# Patient Record
Sex: Female | Born: 1935 | Race: White | Hispanic: No | Smoking: Former smoker
Health system: Southern US, Community
[De-identification: ages and names within clinical notes are randomized; demographics above are authoritative.]

## PROBLEM LIST (undated history)

## (undated) DIAGNOSIS — J449 Chronic obstructive pulmonary disease, unspecified: Secondary | ICD-10-CM

## (undated) DIAGNOSIS — I10 Essential (primary) hypertension: Secondary | ICD-10-CM

## (undated) DIAGNOSIS — E785 Hyperlipidemia, unspecified: Secondary | ICD-10-CM

## (undated) HISTORY — PX: X-STOP IMPLANTATION: SHX2677

## (undated) HISTORY — PX: ROTATOR CUFF REPAIR: SHX139

## (undated) HISTORY — PX: TONSILLECTOMY: SUR1361

## (undated) HISTORY — PX: PARTIAL HYSTERECTOMY: SHX80

## (undated) HISTORY — PX: ABDOMINAL HYSTERECTOMY: SHX81

## (undated) HISTORY — PX: OTHER SURGICAL HISTORY: SHX169

## (undated) HISTORY — PX: APPENDECTOMY: SHX54

## (undated) HISTORY — DX: Essential (primary) hypertension: I10

## (undated) HISTORY — PX: MULTIPLE TOOTH EXTRACTIONS: SHX2053

## (undated) HISTORY — DX: Hyperlipidemia, unspecified: E78.5

---

## 2007-12-29 LAB — HM COLONOSCOPY: HM Colonoscopy: NORMAL

## 2013-07-18 DIAGNOSIS — E559 Vitamin D deficiency, unspecified: Secondary | ICD-10-CM | POA: Diagnosis not present

## 2013-07-18 DIAGNOSIS — E785 Hyperlipidemia, unspecified: Secondary | ICD-10-CM | POA: Diagnosis not present

## 2013-07-18 DIAGNOSIS — Z79899 Other long term (current) drug therapy: Secondary | ICD-10-CM | POA: Diagnosis not present

## 2013-09-22 DIAGNOSIS — H524 Presbyopia: Secondary | ICD-10-CM | POA: Diagnosis not present

## 2013-09-22 DIAGNOSIS — H18519 Endothelial corneal dystrophy, unspecified eye: Secondary | ICD-10-CM | POA: Diagnosis not present

## 2013-10-24 DIAGNOSIS — I10 Essential (primary) hypertension: Secondary | ICD-10-CM | POA: Diagnosis not present

## 2013-10-24 DIAGNOSIS — Z0389 Encounter for observation for other suspected diseases and conditions ruled out: Secondary | ICD-10-CM | POA: Diagnosis not present

## 2013-10-24 DIAGNOSIS — E559 Vitamin D deficiency, unspecified: Secondary | ICD-10-CM | POA: Diagnosis not present

## 2013-10-24 DIAGNOSIS — Z0000 Encounter for general adult medical examination without abnormal findings: Secondary | ICD-10-CM | POA: Diagnosis not present

## 2013-10-24 DIAGNOSIS — J410 Simple chronic bronchitis: Secondary | ICD-10-CM | POA: Diagnosis not present

## 2013-10-24 DIAGNOSIS — E785 Hyperlipidemia, unspecified: Secondary | ICD-10-CM | POA: Diagnosis not present

## 2013-12-22 DIAGNOSIS — H18519 Endothelial corneal dystrophy, unspecified eye: Secondary | ICD-10-CM | POA: Diagnosis not present

## 2013-12-22 DIAGNOSIS — H40019 Open angle with borderline findings, low risk, unspecified eye: Secondary | ICD-10-CM | POA: Diagnosis not present

## 2014-04-18 DIAGNOSIS — E784 Other hyperlipidemia: Secondary | ICD-10-CM | POA: Diagnosis not present

## 2014-04-24 DIAGNOSIS — Z6825 Body mass index (BMI) 25.0-25.9, adult: Secondary | ICD-10-CM | POA: Diagnosis not present

## 2014-04-24 DIAGNOSIS — E785 Hyperlipidemia, unspecified: Secondary | ICD-10-CM | POA: Diagnosis not present

## 2014-04-24 DIAGNOSIS — I10 Essential (primary) hypertension: Secondary | ICD-10-CM | POA: Diagnosis not present

## 2014-04-24 DIAGNOSIS — K219 Gastro-esophageal reflux disease without esophagitis: Secondary | ICD-10-CM | POA: Diagnosis not present

## 2014-04-27 DIAGNOSIS — R509 Fever, unspecified: Secondary | ICD-10-CM | POA: Diagnosis not present

## 2014-04-27 DIAGNOSIS — E785 Hyperlipidemia, unspecified: Secondary | ICD-10-CM | POA: Diagnosis not present

## 2014-04-27 DIAGNOSIS — R109 Unspecified abdominal pain: Secondary | ICD-10-CM | POA: Diagnosis not present

## 2014-04-27 DIAGNOSIS — Z79899 Other long term (current) drug therapy: Secondary | ICD-10-CM | POA: Diagnosis not present

## 2014-04-27 DIAGNOSIS — E780 Pure hypercholesterolemia: Secondary | ICD-10-CM | POA: Diagnosis not present

## 2014-04-27 DIAGNOSIS — F17210 Nicotine dependence, cigarettes, uncomplicated: Secondary | ICD-10-CM | POA: Diagnosis not present

## 2014-04-27 DIAGNOSIS — K2970 Gastritis, unspecified, without bleeding: Secondary | ICD-10-CM | POA: Diagnosis not present

## 2014-04-27 DIAGNOSIS — K353 Acute appendicitis with localized peritonitis: Secondary | ICD-10-CM | POA: Diagnosis not present

## 2014-04-27 DIAGNOSIS — K3589 Other acute appendicitis: Secondary | ICD-10-CM | POA: Diagnosis not present

## 2014-04-27 DIAGNOSIS — K3580 Unspecified acute appendicitis: Secondary | ICD-10-CM | POA: Diagnosis not present

## 2014-04-27 DIAGNOSIS — R10813 Right lower quadrant abdominal tenderness: Secondary | ICD-10-CM | POA: Diagnosis not present

## 2014-04-27 DIAGNOSIS — Z7982 Long term (current) use of aspirin: Secondary | ICD-10-CM | POA: Diagnosis not present

## 2014-04-27 DIAGNOSIS — I10 Essential (primary) hypertension: Secondary | ICD-10-CM | POA: Diagnosis not present

## 2014-07-19 DIAGNOSIS — G4709 Other insomnia: Secondary | ICD-10-CM | POA: Diagnosis not present

## 2014-09-27 DIAGNOSIS — H1851 Endothelial corneal dystrophy: Secondary | ICD-10-CM | POA: Diagnosis not present

## 2014-09-27 DIAGNOSIS — Z947 Corneal transplant status: Secondary | ICD-10-CM | POA: Diagnosis not present

## 2014-11-21 ENCOUNTER — Ambulatory Visit: Payer: Self-pay | Admitting: Family Medicine

## 2014-11-22 ENCOUNTER — Encounter: Payer: Self-pay | Admitting: Family Medicine

## 2014-11-22 ENCOUNTER — Ambulatory Visit (INDEPENDENT_AMBULATORY_CARE_PROVIDER_SITE_OTHER): Payer: Medicare Other | Admitting: Family Medicine

## 2014-11-22 VITALS — BP 110/50 | HR 76 | Ht 62.0 in | Wt 128.0 lb

## 2014-11-22 DIAGNOSIS — M1990 Unspecified osteoarthritis, unspecified site: Secondary | ICD-10-CM | POA: Insufficient documentation

## 2014-11-22 DIAGNOSIS — M150 Primary generalized (osteo)arthritis: Secondary | ICD-10-CM

## 2014-11-22 DIAGNOSIS — Z87891 Personal history of nicotine dependence: Secondary | ICD-10-CM | POA: Insufficient documentation

## 2014-11-22 DIAGNOSIS — F17200 Nicotine dependence, unspecified, uncomplicated: Secondary | ICD-10-CM | POA: Insufficient documentation

## 2014-11-22 DIAGNOSIS — Z720 Tobacco use: Secondary | ICD-10-CM

## 2014-11-22 DIAGNOSIS — I129 Hypertensive chronic kidney disease with stage 1 through stage 4 chronic kidney disease, or unspecified chronic kidney disease: Secondary | ICD-10-CM | POA: Insufficient documentation

## 2014-11-22 DIAGNOSIS — E785 Hyperlipidemia, unspecified: Secondary | ICD-10-CM

## 2014-11-22 DIAGNOSIS — I10 Essential (primary) hypertension: Secondary | ICD-10-CM

## 2014-11-22 DIAGNOSIS — K219 Gastro-esophageal reflux disease without esophagitis: Secondary | ICD-10-CM

## 2014-11-22 DIAGNOSIS — M159 Polyosteoarthritis, unspecified: Secondary | ICD-10-CM

## 2014-11-22 DIAGNOSIS — N951 Menopausal and female climacteric states: Secondary | ICD-10-CM | POA: Insufficient documentation

## 2014-11-22 DIAGNOSIS — Z8739 Personal history of other diseases of the musculoskeletal system and connective tissue: Secondary | ICD-10-CM

## 2014-11-22 NOTE — Progress Notes (Signed)
Date:  11/22/2014   Name:  Karen Velasquez   DOB:  [birth year]   MRN:  [id number]  PCP:  Adline Potter, MD    Chief Complaint: Establish Care   History of Present Illness:  This is a 81 y.o. female with MMP per problem list. She complains of fatigue and exercise intolerance. When we reviewed her medication list, she indicated that she had no interest in discontinuing any of her chronic medications. She stated her previous doctor put her on the medications for a reason and she was doing fine and uninterested in making any changes. Prior to exam we therefore agreed that I could not effectively serve as her PCP despite my status as a board-certified geriatrician.  Review of Systems:  Review of Systems  Patient Active Problem List   Diagnosis Date Noted  . Hypertension 11/22/2014  . GERD (gastroesophageal reflux disease) 11/22/2014  . Hot flashes, menopausal 11/22/2014  . Hyperlipidemia 11/22/2014  . Osteoarthritis 11/22/2014  . Smoker 11/22/2014  . Hx of spinal stenosis 11/22/2014    Prior to Admission medications   Medication Sig Start Date End Date Taking? Authorizing Provider  aspirin EC 81 MG tablet Take by mouth.   Yes Historical Provider, MD  Calcium Carbonate-Vitamin D 600-400 MG-UNIT per tablet Take by mouth.   Yes Historical Provider, MD  esomeprazole (NEXIUM) 40 MG capsule Take by mouth.   Yes Historical Provider, MD  estradiol (ESTRACE) 0.5 MG tablet Take by mouth.   Yes Historical Provider, MD  ezetimibe (ZETIA) 10 MG tablet Take by mouth.   Yes Historical Provider, MD  fenofibrate micronized (ANTARA) 130 MG capsule Take by mouth.   Yes Historical Provider, MD  lisinopril-hydrochlorothiazide (PRINZIDE,ZESTORETIC) 10-12.5 MG per tablet Take by mouth.   Yes Historical Provider, MD  meloxicam (MOBIC) 15 MG tablet Take by mouth.   Yes Historical Provider, MD  naftifine (NAFTIN) 1 % cream Apply topically.   Yes Historical Provider, MD  vitamin C (ASCORBIC ACID) 500 MG tablet Take  by mouth.   Yes Historical Provider, MD    No Known Allergies  Past Surgical History  Procedure Laterality Date  . Tonsillectomy      as a child  . Partial hysterectomy      31yrs of age  . Rotator cuff repair      57yr ago  . Cornea implant      3,4 yrs ago  . Appendectomy      2105  . X-stop implantation N/A     74yrs ago    History  Substance Use Topics  . Smoking status: Current Every Day Smoker -- 0.50 packs/day    Types: Cigarettes  . Smokeless tobacco: Never Used  . Alcohol Use: No    Family History  Problem Relation Age of Onset  . Diabetes Father   . Cancer Father   . Cancer Sister     Medication list has been reviewed and updated.  Physical Examination: BP 110/50 mmHg  Pulse 76  Ht 5\' 2"  (1.575 m)  Wt 128 lb (58.06 kg)  BMI 23.41 kg/m2  Physical Exam  Patient was not examined.  Assessment and Plan:  1. Essential hypertension Concerned hypotension contributing to fatigue/exercise intolerance but pt uninterested in medication changes.  2. Gastroesophageal reflux disease, esophagitis presence not specified Discussed LT risk of continued Nexium use  3. Hot flashes, menopausal Discussed LT risk of continued Estrace use  4. Hyperlipidemia Discussed lack of benefit of primary prevention in this setting  5. Primary  osteoarthritis involving multiple joints Discussed LT risk of continued NSAID use  6. Smoker Not interested in quitting  7. Hx of spinal stenosis  Per HPI, we agreed patient would be best served by finding another PCP.  No Follow-up on file.  Satira Anis. [facility]  11/22/2014

## 2014-12-08 ENCOUNTER — Ambulatory Visit (INDEPENDENT_AMBULATORY_CARE_PROVIDER_SITE_OTHER): Payer: Medicare Other | Admitting: Unknown Physician Specialty

## 2014-12-08 ENCOUNTER — Encounter: Payer: Self-pay | Admitting: Unknown Physician Specialty

## 2014-12-08 VITALS — BP 132/74 | HR 82 | Temp 98.2°F | Ht 61.8 in | Wt 128.4 lb

## 2014-12-08 DIAGNOSIS — I10 Essential (primary) hypertension: Secondary | ICD-10-CM | POA: Diagnosis not present

## 2014-12-08 DIAGNOSIS — M150 Primary generalized (osteo)arthritis: Secondary | ICD-10-CM | POA: Diagnosis not present

## 2014-12-08 DIAGNOSIS — F17200 Nicotine dependence, unspecified, uncomplicated: Secondary | ICD-10-CM

## 2014-12-08 DIAGNOSIS — E785 Hyperlipidemia, unspecified: Secondary | ICD-10-CM | POA: Diagnosis not present

## 2014-12-08 DIAGNOSIS — K219 Gastro-esophageal reflux disease without esophagitis: Secondary | ICD-10-CM

## 2014-12-08 DIAGNOSIS — Z720 Tobacco use: Secondary | ICD-10-CM | POA: Diagnosis not present

## 2014-12-08 DIAGNOSIS — L259 Unspecified contact dermatitis, unspecified cause: Secondary | ICD-10-CM | POA: Insufficient documentation

## 2014-12-08 DIAGNOSIS — M159 Polyosteoarthritis, unspecified: Secondary | ICD-10-CM

## 2014-12-08 LAB — MICROALBUMIN, URINE WAIVED
CREATININE, URINE WAIVED: 50 mg/dL (ref 10–300)
Microalb, Ur Waived: 10 mg/L (ref 0–19)
Microalb/Creat Ratio: <30 mg/g (ref <30)

## 2014-12-08 LAB — LIPID PANEL PICCOLO, WAIVED
Chol/HDL Ratio Piccolo,Waive: 2.2 mg/dL
Cholesterol Piccolo, Waived: 153 mg/dL (ref <200)
HDL Chol Piccolo, Waived: 70 mg/dL (ref >59)
LDL Chol Calc Piccolo Waived: 68 mg/dL (ref <100)
TRIGLYCERIDES PICCOLO,WAIVED: 74 mg/dL (ref <150)
VLDL Chol Calc Piccolo,Waive: 15 mg/dL (ref <30)

## 2014-12-08 MED ORDER — LISINOPRIL-HYDROCHLOROTHIAZIDE 10-12.5 MG PO TABS: 1.0000 | ORAL_TABLET | Freq: Every day | ORAL | Status: DC

## 2014-12-08 MED ORDER — BETAMETHASONE DIPROPIONATE 0.05 % EX CREA: TOPICAL_CREAM | Freq: Two times a day (BID) | CUTANEOUS | Status: DC

## 2014-12-08 MED ORDER — MELOXICAM 15 MG PO TABS: 15.0000 mg | ORAL_TABLET | Freq: Every day | ORAL | Status: DC

## 2014-12-08 NOTE — Patient Instructions (Addendum)
f you have chronic pain and are looking for alternatives to medication and surgery, you have a lot of options.   However, not all alternative pain treatments work. Some can even be risky. Some alternative treatments may help with pain from bad backs, osteoarthritis, and headaches, but have no effect on chronic pain from fibromyalgia or diabetic nerve damage.  Here's a rundown of the most commonly used alternative treatments for chronic pain.  Acupuncture. Once seen as bizarre, acupuncture is rapidly becoming a mainstream treatment for pain. Studies have found that it works for pain caused by many conditions, including fibromyalgia, osteoarthritis, back injuries, and sports injuries. How does it work? No one's quite sure. It could release pain-numbing chemicals in the body. Or it might block the pain signals coming from the nerves.  Exercise. Motion is lotion Going for a walk or swim isn't a treatment, exactly. But regular physical activity has big benefits for people with many different painful conditions. Study after study has found that physical activity can help relieve chronic pain, as well as boost energy and mood.   This is particularly true of pain related to arthritis .    Chiropractic manipulation. Although mainstream medicine has traditionally regarded spinal manipulation with suspicion, it's becoming a more accepted treatment.  I think many people respond will th chiropractic treatment.    Supplements and vitamins. There is evidence that certain dietary supplements and vitamins can help with certain types of pain. Fish oil and flaxseed oil is often used to reduce pain associated with swelling. Topical capsaicin, derived from chili peppers, may help with arthritis, diabetic nerve pain, and other conditions. There's evidence that glucosamine can help relieve moderate to severe pain from osteoarthritis in the knee.  A spice Tumeric is used my many to treat chronic pain.  Curcumin is the active  ingredient and thought to have anti-inflammatory effects and reduces pain and stiffness.  This can be found as capsules or extract (more likely to be free of contaminants). For OA: Capsule, typically 400 mg to 600 mg, three times per day; or 0.5 g to 1 g of powdered root up to 3 g per day. For RA: 500 mg twice daily.  It's best absorbed if it contains black pepper.    But when it comes to supplements, you have to be careful. High doses of B6 can damage the nerves.   Some studies suggest that supplements such as ginkgo biloba and ginseng can thin the blood and increase the risk of bleeding. This could lead to serious consequences for anyone getting surgery for chronic pain.  Finally, supplements don't always contain what they claim as supplement manufacturers are not regulated by the FDA.  I get very confused with the thousands of supplements available and which to choose.  Brands to consider: [employer], [employer], [employer], [employer], [employer], Duwayne Heck and Tresa Garter   I usually go on [employer] and look for the highly rated products.  [employer] or [employer] are great resources and have done the research for you.     Cognitive Behavioral Therapy. Some people with chronic pain balk at the idea of seeing a therapist -- they think it implies that their pain isn't real. But studies show that depression and chronic pain often go together. Chronic pain can cause or worsen depression; depression can lower a person's tolerance for pain.  Stress-reduction techniques. There are number of approaches, including: Yoga. There's good evidence that yoga can help with chronic pain,  specifically fibromyalgia, neck pain, back pain, and arthritis. Relaxation therapy. This is actually a category of techniques that help people calm the body and release tension -- a process that might also reduce pain. Some approaches teach people how to focus on their breathing. Research shows that relaxation  therapy can help with fibromyalgia, headache, osteoarthritis, and other conditions. Hypnosis. Studies have found this approach helpful with different sorts of pain, like back pain, repetitive strain injuries, and cancer pain. Guided imagery. Research shows that guided imagery can help with conditions like headache pain, cancer pain, osteoarthritis, and fibromyalgia. How does it work? An expert would teach you ways to direct your thoughts by focusing on specific images. Music therapy. This approach gets people to either perform or listen to music. Studies have found that it can help with many different pain conditions, like osteoarthritis and cancer pain. Biofeedback. This approach teaches you how to control normally unconscious bodily functions, like blood pressure or your heart rate. Studies have found that it can help with headaches, fibromyalgia, and other conditions. Massage. It's undeniably relaxing. And there's some evidence that massage can help ease pain from rheumatoid arthritis, neck and back injuries, and fibromyalgia.  Risky Alternative Pain Treatments Experts say you should keep your expectations for alternative pain treatments modest -- especially when it comes to "miracle cures." Controlling chronic pain is not simple. A single supplement, device, or treatment is not going to make your chronic pain disappear. You a need to be suspicious of anyone pushing a treatment when the financial motive is blatant. That doesn't only apply to pain treatments advertised on dubious web sites asking for your credit card number.  Experts say that you should try to keep up to date with research into alternative treatments for chronic pain. The options for people with chronic pain are always growing -- and some of the odder treatments of today might become the mainstream treatments of tomorrow.

## 2014-12-08 NOTE — Assessment & Plan Note (Signed)
Not interested in quitting 

## 2014-12-08 NOTE — Assessment & Plan Note (Signed)
Encouraged not taking Meloxicam daily.  Gave handout on alternatives.

## 2014-12-08 NOTE — Assessment & Plan Note (Signed)
This is stable.  Continue present medications

## 2014-12-08 NOTE — Assessment & Plan Note (Signed)
Under excellent control.  She desires to make no changes in her treatment.

## 2014-12-08 NOTE — Assessment & Plan Note (Signed)
Suspect related to chronic rubbing following contact with something.  Rx with Diprolene cream

## 2014-12-08 NOTE — Assessment & Plan Note (Signed)
Takes nexium only on occasion

## 2014-12-08 NOTE — Progress Notes (Signed)
BP 132/74 mmHg  Pulse 82  Temp(Src) 98.2 F (36.8 C)  Ht 5' 1.8" (1.57 m)  Wt 128 lb 6.4 oz (58.242 kg)  BMI 23.63 kg/m2  SpO2 98%  LMP 11/28/1971 (Approximate)   Subjective:    Patient ID: Karen Velasquez, female    DOB: Sep 23, 1930, 81 y.o.   MRN: [id number]  HPI: Karen Velasquez is a 81 y.o. female  Chief Complaint  Patient presents with  . Establish Care    Relevant past medical, surgical, family and social history reviewed and updated as indicated. Interim medical history since our last visit reviewed. Allergies and medications reviewed and updated.  Hypertension This is a chronic problem. The current episode started more than 1 year ago. The problem is unchanged. The problem is controlled. There are no associated agents to hypertension. The current treatment provides significant improvement. There are no compliance problems.  There is no history of angina, kidney disease, CAD/MI or retinopathy.   Menopause: Taking Estrace ever since having a Hysterectomy shen she was 42 years. Old   "I have tried to get off of it but I was miserable"  Hypercholesterol: Brought in readings from last fall showing excellent results and an LDL of 100  Arthritis Pt with aching in hands and shoulder.  Takes Meloxicam daily  Review of Systems  Constitutional: Negative.   HENT: Negative.   Eyes: Negative.   Respiratory: Negative.   Cardiovascular: Negative.   Gastrointestinal: Negative.   Endocrine: Negative.   Genitourinary: Negative.   Musculoskeletal: Negative.   Skin: Negative.        Itching on an area of forearm.  She has tried vinegar and cortisone.    Allergic/Immunologic: Negative.   Neurological: Negative.   Hematological: Negative.   Psychiatric/Behavioral: Negative.     Per HPI unless specifically indicated above     Objective:    BP 132/74 mmHg  Pulse 82  Temp(Src) 98.2 F (36.8 C)  Ht 5' 1.8" (1.57 m)  Wt 128 lb 6.4 oz (58.242 kg)  BMI 23.63 kg/m2  SpO2 98%   LMP 11/28/1971 (Approximate)  Wt Readings from Last 3 Encounters:  12/08/14 128 lb 6.4 oz (58.242 kg)  11/22/14 128 lb (58.06 kg)    Physical Exam  Constitutional: She is oriented to person, place, and time. She appears well-developed and well-nourished. No distress.  HENT:  Head: Normocephalic and atraumatic.  Eyes: Conjunctivae and lids are normal. Right eye exhibits no discharge. Left eye exhibits no discharge. No scleral icterus.  Cardiovascular: Normal rate and regular rhythm.   Pulmonary/Chest: Effort normal. No respiratory distress.  Abdominal: Soft. Normal appearance and bowel sounds are normal. She exhibits no distension. There is no splenomegaly or hepatomegaly. There is no tenderness.  Musculoskeletal: Normal range of motion.  Neurological: She is alert and oriented to person, place, and time.  Skin: Skin is warm, dry and intact. No rash noted. No pallor.  Psychiatric: She has a normal mood and affect. Her behavior is normal. Judgment and thought content normal.    No results found for this or any previous visit.    Assessment & Plan:   Problem List Items Addressed This Visit      Cardiovascular and Mediastinum   Hypertension - Primary    This is stable.  Continue present medications      Relevant Medications   lisinopril-hydrochlorothiazide (PRINZIDE,ZESTORETIC) 10-12.5 MG per tablet   Other Relevant Orders   Lipid Panel Piccolo, Waived   Microalbumin, Urine Waived   Uric  acid   Comprehensive metabolic panel   TSH     Digestive   GERD (gastroesophageal reflux disease)    Takes nexium only on occasion        Musculoskeletal and Integument   Osteoarthritis    Encouraged not taking Meloxicam daily.  Gave handout on alternatives.        Relevant Medications   meloxicam (MOBIC) 15 MG tablet     Other   Hyperlipidemia    Under excellent control.  She desires to make no changes in her treatment.        Relevant Medications    lisinopril-hydrochlorothiazide (PRINZIDE,ZESTORETIC) 10-12.5 MG per tablet   Other Relevant Orders   Lipid Panel Piccolo, Waived   Smoker    Not interested in quitting.            Follow up plan: Return for Medicare physical.

## 2014-12-09 LAB — COMPREHENSIVE METABOLIC PANEL
A/G RATIO: 2 (ref 1.1–2.5)
ALBUMIN: 4.5 g/dL (ref 3.5–4.7)
ALT: 11 IU/L (ref 0–32)
AST: 18 IU/L (ref 0–40)
Alkaline Phosphatase: 39 IU/L (ref 39–117)
BUN/Creatinine Ratio: 30 — ABNORMAL HIGH (ref 11–26)
BUN: 22 mg/dL (ref 8–27)
Bilirubin Total: 0.4 mg/dL (ref 0.0–1.2)
CO2: 22 mmol/L (ref 18–29)
Calcium: 9.8 mg/dL (ref 8.7–10.3)
Chloride: 104 mmol/L (ref 97–108)
Creatinine, Ser: 0.74 mg/dL (ref 0.57–1.00)
GFR calc Af Amer: 87 mL/min/{1.73_m2} (ref >59)
GFR, EST NON AFRICAN AMERICAN: 76 mL/min/{1.73_m2} (ref >59)
Globulin, Total: 2.3 g/dL (ref 1.5–4.5)
Glucose: 92 mg/dL (ref 65–99)
Potassium: 4.4 mmol/L (ref 3.5–5.2)
SODIUM: 143 mmol/L (ref 134–144)
Total Protein: 6.8 g/dL (ref 6.0–8.5)

## 2014-12-09 LAB — URIC ACID: Uric Acid: 4.7 mg/dL (ref 2.5–7.1)

## 2014-12-09 LAB — TSH: TSH: 0.587 u[IU]/mL (ref 0.450–4.500)

## 2014-12-12 ENCOUNTER — Encounter: Payer: Self-pay | Admitting: Family Medicine

## 2014-12-29 ENCOUNTER — Encounter: Payer: Self-pay | Admitting: Unknown Physician Specialty

## 2014-12-29 ENCOUNTER — Ambulatory Visit (INDEPENDENT_AMBULATORY_CARE_PROVIDER_SITE_OTHER): Payer: Medicare Other | Admitting: Unknown Physician Specialty

## 2014-12-29 VITALS — BP 120/71 | HR 84 | Temp 98.3°F | Ht 61.5 in | Wt 129.4 lb

## 2014-12-29 DIAGNOSIS — Z0000 Encounter for general adult medical examination without abnormal findings: Secondary | ICD-10-CM

## 2014-12-29 NOTE — Progress Notes (Signed)
Patient: Karen Velasquez, Female    DOB: 11-17-1930, 81 y.o.   MRN: [id number]  Visit Date: 12/29/2014  Today's Provider: Kathrine Haddock, NP   Chief Complaint  Patient presents with  . Medicare Wellness    Subjective:   Karen Velasquez is a 81 y.o. female who presents today for her Subsequent Annual Wellness Visit.  Caregiver input:    HPI  Review of Systems  @MEDHX @  Past Surgical History  Procedure Laterality Date  . Tonsillectomy      as a child  . Partial hysterectomy      89yrs of age  . Rotator cuff repair      77yr ago  . Cornea implant      3,4 yrs ago  . Appendectomy      2105  . X-stop implantation N/A     46yrs ago  . Abdominal hysterectomy      Family History  Problem Relation Age of Onset  . Diabetes Father   . Cancer Father   . Cancer Sister     History   Social History  . Marital Status: Widowed    Spouse Name: N/A  . Number of Children: 1  . Years of Education: N/A   Occupational History  . retired    Social History Main Topics  . Smoking status: Current Every Day Smoker -- 0.50 packs/day    Types: Cigarettes  . Smokeless tobacco: Never Used  . Alcohol Use: No  . Drug Use: No  . Sexual Activity: Not Currently   Other Topics Concern  . Not on file   Social History Narrative    Outpatient Encounter Prescriptions as of 12/29/2014  Medication Sig Note  . aspirin EC 81 MG tablet Take by mouth. 11/22/2014: Received from: [facility]  . betamethasone dipropionate (DIPROLENE) 0.05 % cream Apply topically 2 (two) times daily.   . Calcium Carbonate-Vitamin D 600-400 MG-UNIT per tablet Take by mouth. 11/22/2014: Received from: [facility]  . esomeprazole (NEXIUM) 40 MG capsule Take by mouth. 11/22/2014: Received from: [facility]  . estradiol (ESTRACE) 0.5 MG tablet Take by mouth. 11/22/2014: Received from: [facility]  . ezetimibe (ZETIA) 10 MG tablet Take by mouth. 11/22/2014:  Received from: [facility]  . fenofibrate micronized (ANTARA) 130 MG capsule Take by mouth. 11/22/2014: Received from: [facility]  . lisinopril-hydrochlorothiazide (PRINZIDE,ZESTORETIC) 10-12.5 MG per tablet Take 1 tablet by mouth daily.   . meloxicam (MOBIC) 15 MG tablet Take 1 tablet (15 mg total) by mouth daily.   . naftifine (NAFTIN) 1 % cream Apply topically as needed.  11/22/2014: Received from: [facility]  . vitamin C (ASCORBIC ACID) 500 MG tablet Take by mouth. 11/22/2014: Received from: [facility]   No facility-administered encounter medications on file as of 12/29/2014.    Functional Ability / Safety Screening 1.  Was the timed Get Up and Go test longer than 30 seconds?  no 2.  Does the patient need help with the phone, transportation, shopping,      preparing meals, housework, laundry, medications, or managing money?  no 3.  Does the patient's home have:  loose throw rugs in the hallway?   no      Grab bars in the bathroom? no      Handrails on the stairs?   no      Poor lighting?   no 4.  Has the patient noticed any  hearing difficulties?   yes  Fall Risk Assessment See under rooming  Depression Screen See under rooming Depression screen [facility] 2/9 12/29/2014 11/22/2014  Decreased Interest 0 (No Data)  Down, Depressed, Hopeless 0 0  PHQ - 2 Score 0 0    Advanced Directives Does patient have a HCPOA?    yes If yes, name and contact information:  Does patient have a living will or MOST form?  yes  Objective:   Vitals: BP 120/71 mmHg  Pulse 84  Temp(Src) 98.3 F (36.8 C)  Ht 5' 1.5" (1.562 m)  Wt 129 lb 6.4 oz (58.695 kg)  BMI 24.06 kg/m2  SpO2 96%  LMP 11/28/1971 (Approximate) Body mass index is 24.06 kg/(m^2). No exam data present  Physical Exam Mood/affect:   Appearance:    Cognitive Testing - 6-CIT  Correct? Score   What year is it? yes 0 Yes = 0    No = 4  What month is it? yes 0  Yes = 0    No = 3  Remember:     [address]ia [address], [place], [place]     What time is it? yes 0 Yes = 0    No = 3  Count backwards from 20 to 1 yes 0 Correct = 0    1 error = 2   More than 1 error = 4  Say the months of the year in reverse. yes 0 Correct = 0    1 error = 2   More than 1 error = 4  What address did I ask you to remember?   0 Correct = 0  1 error = 2    2 error = 4    3 error = 6    4 error = 8    All wrong = 10       TOTAL SCORE  0/28   Interpretation:  Normal  Normal (0-7) Abnormal (8-28)    Assessment & Plan:     Annual Wellness Visit  Reviewed patient's Family Medical History Reviewed and updated list of patient's medical providers Assessment of cognitive impairment was done Assessed patient's functional ability Established a written schedule for health screening [name] Completed and Reviewed  Exercise Activities and Dietary recommendations Goals    None       There is no immunization history on file for this patient.  Health Maintenance  Topic Date Due  . TETANUS/TDAP  09/12/1951  . COLONOSCOPY  09/12/1982  . DEXA SCAN  09/11/1997  . PNA vac Low Risk Adult (1 of 2 - PCV13) 09/11/1997  . INFLUENZA VACCINE  01/08/2015  . ZOSTAVAX  Addressed     Discussed health benefits of physical activity, and encouraged her to engage in regular exercise appropriate for her age and condition.   No orders of the defined types were placed in this encounter.    Current outpatient prescriptions:  .  aspirin EC 81 MG tablet, Take by mouth., Disp: , Rfl:  .  betamethasone dipropionate (DIPROLENE) 0.05 % cream, Apply topically 2 (two) times daily., Disp: 30 g, Rfl: 0 .  Calcium Carbonate-Vitamin D 600-400 MG-UNIT per tablet, Take by mouth., Disp: , Rfl:  .  esomeprazole (NEXIUM) 40 MG capsule, Take by mouth., Disp: , Rfl:  .  estradiol (ESTRACE) 0.5 MG tablet, Take by mouth., Disp: , Rfl:  .  ezetimibe (ZETIA) 10 MG tablet, Take by mouth.,  Disp: , Rfl:  .  fenofibrate micronized (ANTARA)  130 MG capsule, Take by mouth., Disp: , Rfl:  .  lisinopril-hydrochlorothiazide (PRINZIDE,ZESTORETIC) 10-12.5 MG per tablet, Take 1 tablet by mouth daily., Disp: 30 tablet, Rfl: 6 .  meloxicam (MOBIC) 15 MG tablet, Take 1 tablet (15 mg total) by mouth daily., Disp: 30 tablet, Rfl: 2 .  naftifine (NAFTIN) 1 % cream, Apply topically as needed. , Disp: , Rfl:  .  vitamin C (ASCORBIC ACID) 500 MG tablet, Take by mouth., Disp: , Rfl:  There are no discontinued medications.   Discussed with pt that guidelines suggest she can stop her cholesterol medications but she would like to continue with her cholesterol meds due to fear of stroke  Agrees to decrease Meloxicam to every other day.  Reviewed last lab results.   Next Medicare Wellness Visit in 12+ months

## 2015-01-11 ENCOUNTER — Other Ambulatory Visit: Payer: Self-pay

## 2015-01-11 DIAGNOSIS — H1851 Endothelial corneal dystrophy: Secondary | ICD-10-CM | POA: Diagnosis not present

## 2015-01-11 MED ORDER — FENOFIBRATE MICRONIZED 130 MG PO CAPS: 130.0000 mg | ORAL_CAPSULE | Freq: Every day | ORAL | Status: DC

## 2015-01-11 MED ORDER — EZETIMIBE 10 MG PO TABS: 10.0000 mg | ORAL_TABLET | Freq: Every day | ORAL | Status: DC

## 2015-01-11 MED ORDER — ESTRADIOL 0.5 MG PO TABS: 0.5000 mg | ORAL_TABLET | Freq: Every day | ORAL | Status: DC

## 2015-01-11 MED ORDER — ESOMEPRAZOLE MAGNESIUM 40 MG PO CPDR: 40.0000 mg | DELAYED_RELEASE_CAPSULE | Freq: Every day | ORAL | Status: DC

## 2015-01-11 NOTE — Telephone Encounter (Signed)
Patient was seen on 12/29/14 for medicare physical and pharmacy is [facility].

## 2015-02-27 DIAGNOSIS — Z23 Encounter for immunization: Secondary | ICD-10-CM | POA: Diagnosis not present

## 2015-06-24 ENCOUNTER — Ambulatory Visit
Admission: EM | Admit: 2015-06-24 | Discharge: 2015-06-24 | Disposition: A | Discharge status: Home / Self Care | Payer: Medicare Other | Attending: Family Medicine | Admitting: Family Medicine

## 2015-06-24 DIAGNOSIS — J0100 Acute maxillary sinusitis, unspecified: Secondary | ICD-10-CM | POA: Diagnosis not present

## 2015-06-24 DIAGNOSIS — H6501 Acute serous otitis media, right ear: Secondary | ICD-10-CM | POA: Diagnosis not present

## 2015-06-24 MED ORDER — HYDROCOD POLST-CPM POLST ER 10-8 MG/5ML PO SUER: 5.0000 mL | Freq: Every evening | ORAL | Status: DC | PRN

## 2015-06-24 MED ORDER — AMOXICILLIN 875 MG PO TABS: 875.0000 mg | ORAL_TABLET | Freq: Two times a day (BID) | ORAL | Status: DC

## 2015-06-24 NOTE — ED Provider Notes (Signed)
CSN: [id number]     Arrival date & time 06/24/15  1119 History   First MD Initiated Contact with Patient 06/24/15 1326     Chief Complaint  Patient presents with  . URI   (Consider location/radiation/quality/duration/timing/severity/associated sxs/prior Treatment) Patient is a 82 y.o. female presenting with URI. The history is provided by the patient.  URI Presenting symptoms: congestion, cough, ear pain, facial pain, fever and rhinorrhea   Severity:  Moderate Onset quality:  Sudden Duration:  1 week Timing:  Constant Progression:  Worsening Chronicity:  New Ineffective treatments:  OTC medications Associated symptoms: headaches and sinus pain     Past Medical History  Diagnosis Date  . Hypertension   . Hyperlipidemia    Past Surgical History  Procedure Laterality Date  . Tonsillectomy      as a child  . Partial hysterectomy      73yrs of age  . Rotator cuff repair      62yr ago  . Cornea implant      3,4 yrs ago  . Appendectomy      2105  . X-stop implantation N/A     13yrs ago  . Abdominal hysterectomy     Family History  Problem Relation Age of Onset  . Diabetes Father   . Cancer Father   . Cancer Sister    Social History  Substance Use Topics  . Smoking status: Current Every Day Smoker -- 0.50 packs/day    Types: Cigarettes  . Smokeless tobacco: Never Used  . Alcohol Use: No   OB History    No data available     Review of Systems  Constitutional: Positive for fever.  HENT: Positive for congestion, ear pain and rhinorrhea.   Respiratory: Positive for cough.   Neurological: Positive for headaches.    Allergies  Review of patient's allergies indicates no known allergies.  Home Medications   Prior to Admission medications   Medication Sig Start Date End Date Taking? Authorizing Provider  aspirin EC 81 MG tablet Take by mouth.   Yes Historical Provider, MD  esomeprazole (NEXIUM) 40 MG capsule Take 1 capsule (40 mg total) by mouth daily at 12  noon. 01/11/15  Yes Kathrine Haddock, NP  estradiol (ESTRACE) 0.5 MG tablet Take 1 tablet (0.5 mg total) by mouth daily. 01/11/15  Yes Kathrine Haddock, NP  ezetimibe (ZETIA) 10 MG tablet Take 1 tablet (10 mg total) by mouth daily. 01/11/15  Yes Kathrine Haddock, NP  fenofibrate micronized (ANTARA) 130 MG capsule Take 1 capsule (130 mg total) by mouth daily before breakfast. 01/11/15  Yes Kathrine Haddock, NP  lisinopril-hydrochlorothiazide (PRINZIDE,ZESTORETIC) 10-12.5 MG per tablet Take 1 tablet by mouth daily. 12/08/14  Yes Kathrine Haddock, NP  vitamin C (ASCORBIC ACID) 500 MG tablet Take by mouth.   Yes Historical Provider, MD  amoxicillin (AMOXIL) 875 MG tablet Take 1 tablet (875 mg total) by mouth 2 (two) times daily. 06/24/15   Norval Gable, MD  betamethasone dipropionate (DIPROLENE) 0.05 % cream Apply topically 2 (two) times daily. 12/08/14   Kathrine Haddock, NP  Calcium Carbonate-Vitamin D 600-400 MG-UNIT per tablet Take by mouth.    Historical Provider, MD  chlorpheniramine-HYDROcodone (TUSSIONEX PENNKINETIC ER) 10-8 MG/5ML SUER Take 5 mLs by mouth at bedtime as needed for cough. 06/24/15   Norval Gable, MD  meloxicam (MOBIC) 15 MG tablet Take 1 tablet (15 mg total) by mouth daily. 12/08/14   Kathrine Haddock, NP  naftifine (NAFTIN) 1 % cream Apply topically as needed.  Historical Provider, MD   Meds Ordered and Administered this Visit  Medications - No data to display  BP 102/47 mmHg  Pulse 82  Temp(Src) 97.9 F (36.6 C) (Tympanic)  Resp 17  Ht 5\' 2"  (1.575 m)  Wt 130 lb (58.968 kg)  BMI 23.77 kg/m2  SpO2 100%  LMP 11/28/1971 (Approximate) No data found.   Physical Exam  Constitutional: She appears well-developed and well-nourished. No distress.  HENT:  Head: Normocephalic and atraumatic.  Right Ear: External ear and ear canal normal. Tympanic membrane is injected and bulging. A middle ear effusion is present.  Left Ear: Tympanic membrane, external ear and ear canal normal.  Nose: Mucosal edema and  rhinorrhea present. No nose lacerations, sinus tenderness, nasal deformity, septal deviation or nasal septal hematoma. No epistaxis.  No foreign bodies. Right sinus exhibits maxillary sinus tenderness and frontal sinus tenderness. Left sinus exhibits maxillary sinus tenderness and frontal sinus tenderness.  Mouth/Throat: Uvula is midline, oropharynx is clear and moist and mucous membranes are normal. No oropharyngeal exudate.  Eyes: Conjunctivae and EOM are normal. Pupils are equal, round, and reactive to light. Right eye exhibits no discharge. Left eye exhibits no discharge. No scleral icterus.  Neck: Normal range of motion. Neck supple. No thyromegaly present.  Cardiovascular: Normal rate, regular rhythm and normal heart sounds.   Pulmonary/Chest: Effort normal and breath sounds normal. No respiratory distress. She has no wheezes. She has no rales.  Lymphadenopathy:    She has no cervical adenopathy.  Skin: She is not diaphoretic.  Nursing note and vitals reviewed.   ED Course  Procedures (including critical care time)  Labs Review Labs Reviewed - No data to display  Imaging Review No results found.   Visual Acuity Review  Right Eye Distance:   Left Eye Distance:   Bilateral Distance:    Right Eye Near:   Left Eye Near:    Bilateral Near:         MDM   1. Right acute serous otitis media, recurrence not specified   2. Acute maxillary sinusitis, recurrence not specified    Discharge Medication List as of 06/24/2015  1:34 PM    START taking these medications   Details  amoxicillin (AMOXIL) 875 MG tablet Take 1 tablet (875 mg total) by mouth 2 (two) times daily., Starting 06/24/2015, Until Discontinued, Normal    chlorpheniramine-HYDROcodone (TUSSIONEX PENNKINETIC ER) 10-8 MG/5ML SUER Take 5 mLs by mouth at bedtime as needed for cough., Starting 06/24/2015, Until Discontinued, Normal       1. diagnosis reviewed with patient 2. rx as per orders above; reviewed possible  side effects, interactions, risks and benefits  3. Recommend supportive treatment with rest, increased fluids 4. Follow-up prn if symptoms worsen or don't improve    Norval Gable, MD 06/24/15 1338

## 2015-06-24 NOTE — ED Notes (Signed)
Started 1 week ago  With runny nose and cough. Cough not improving and worse at night

## 2015-07-03 ENCOUNTER — Ambulatory Visit (INDEPENDENT_AMBULATORY_CARE_PROVIDER_SITE_OTHER): Payer: Medicare Other | Admitting: Unknown Physician Specialty

## 2015-07-03 ENCOUNTER — Encounter: Payer: Self-pay | Admitting: Unknown Physician Specialty

## 2015-07-03 VITALS — BP 120/71 | HR 108 | Temp 98.6°F | Ht 60.6 in | Wt 131.4 lb

## 2015-07-03 DIAGNOSIS — Z720 Tobacco use: Secondary | ICD-10-CM | POA: Diagnosis not present

## 2015-07-03 DIAGNOSIS — E785 Hyperlipidemia, unspecified: Secondary | ICD-10-CM | POA: Diagnosis not present

## 2015-07-03 DIAGNOSIS — I10 Essential (primary) hypertension: Secondary | ICD-10-CM | POA: Diagnosis not present

## 2015-07-03 DIAGNOSIS — Z23 Encounter for immunization: Secondary | ICD-10-CM

## 2015-07-03 DIAGNOSIS — F17200 Nicotine dependence, unspecified, uncomplicated: Secondary | ICD-10-CM

## 2015-07-03 DIAGNOSIS — N951 Menopausal and female climacteric states: Secondary | ICD-10-CM

## 2015-07-03 MED ORDER — SERTRALINE HCL 25 MG PO TABS: 25.0000 mg | ORAL_TABLET | Freq: Every day | ORAL | Status: DC

## 2015-07-03 NOTE — Assessment & Plan Note (Signed)
Wants to quit.  I am reluctant to start Sertraline and Chantix on the same day.  Recommended Quit smoking class at [facility].

## 2015-07-03 NOTE — Assessment & Plan Note (Signed)
Start low dose Sertraline

## 2015-07-03 NOTE — Assessment & Plan Note (Signed)
Stable, continue present medications.   

## 2015-07-03 NOTE — Progress Notes (Signed)
   BP 120/71 mmHg  Pulse 108  Temp(Src) 98.6 F (37 C)  Ht 5' 0.6" (1.539 m)  Wt 131 lb 6.4 oz (59.603 kg)  BMI 25.16 kg/m2  SpO2 97%  LMP 11/28/1971 (Approximate)   Subjective:    Patient ID: Karen Velasquez, female    DOB: 09-03-1930, 82 y.o.   MRN: [id number]  HPI: Karen Velasquez is a 82 y.o. female  Chief Complaint  Patient presents with  . Hyperlipidemia  . Hypertension  . Nicotine Dependence    pt states she would like help to stop smoking   Hypertension Using medications without difficulty Average home BPs   No problems or lightheadedness No chest pain with exertion or shortness of breath No Edema   Hyperlipidemia Using medications without problems No Muscle aches  Diet compliance: good Exercise:stays active  Menopause Pt stopped her hormones and havinghot flashes "day and night."   Tobacco dependence Went to Urgent Care due to a respiratory illness.  She is currently smoking about 1/2 ppd.     Relevant past medical, surgical, family and social history reviewed and updated as indicated. Interim medical history since our last visit reviewed. Allergies and medications reviewed and updated.  Review of Systems  Per HPI unless specifically indicated above     Objective:    BP 120/71 mmHg  Pulse 108  Temp(Src) 98.6 F (37 C)  Ht 5' 0.6" (1.539 m)  Wt 131 lb 6.4 oz (59.603 kg)  BMI 25.16 kg/m2  SpO2 97%  LMP 11/28/1971 (Approximate)  Wt Readings from Last 3 Encounters:  07/03/15 131 lb 6.4 oz (59.603 kg)  06/24/15 130 lb (58.968 kg)  12/29/14 129 lb 6.4 oz (58.695 kg)    Physical Exam  Constitutional: She is oriented to person, place, and time. She appears well-developed and well-nourished. No distress.  HENT:  Head: Normocephalic and atraumatic.  Eyes: Conjunctivae and lids are normal. Right eye exhibits no discharge. Left eye exhibits no discharge. No scleral icterus.  Neck: Normal range of motion. Neck supple. No JVD present. Carotid bruit is  not present.  Cardiovascular: Normal rate, regular rhythm and normal heart sounds.   Pulmonary/Chest: Effort normal and breath sounds normal.  Abdominal: Normal appearance. There is no splenomegaly or hepatomegaly.  Musculoskeletal: Normal range of motion.  Neurological: She is alert and oriented to person, place, and time.  Skin: Skin is warm, dry and intact. No rash noted. No pallor.  Psychiatric: She has a normal mood and affect. Her behavior is normal. Judgment and thought content normal.    Results for orders placed or performed in visit on 12/29/14  HM COLONOSCOPY  Result Value Ref Range   HM Colonoscopy normal       Assessment & Plan:   Problem List Items Addressed This Visit      Unprioritized   Hypertension    Stable, continue present medications.        Hot flashes, menopausal    Start low dose Sertraline      Hyperlipidemia    Other Visit Diagnoses    Need for pneumococcal vaccination    -  Primary    Relevant Orders    Pneumococcal polysaccharide vaccine 23-valent greater than or equal to 2yo subcutaneous/IM (Completed)        Follow up plan: Return in about 6 weeks (around 08/14/2015).

## 2015-07-03 NOTE — Patient Instructions (Signed)
Free quit smoking class: [id number]

## 2015-07-18 ENCOUNTER — Ambulatory Visit (INDEPENDENT_AMBULATORY_CARE_PROVIDER_SITE_OTHER): Payer: Medicare Other | Admitting: Unknown Physician Specialty

## 2015-07-18 ENCOUNTER — Encounter: Payer: Self-pay | Admitting: Unknown Physician Specialty

## 2015-07-18 VITALS — BP 148/77 | HR 109 | Temp 98.5°F | Ht 61.0 in | Wt 128.8 lb

## 2015-07-18 DIAGNOSIS — J019 Acute sinusitis, unspecified: Secondary | ICD-10-CM | POA: Diagnosis not present

## 2015-07-18 DIAGNOSIS — R05 Cough: Secondary | ICD-10-CM

## 2015-07-18 DIAGNOSIS — R059 Cough, unspecified: Secondary | ICD-10-CM

## 2015-07-18 MED ORDER — BENZONATATE 100 MG PO CAPS: 100.0000 mg | ORAL_CAPSULE | Freq: Two times a day (BID) | ORAL | Status: DC | PRN

## 2015-07-18 MED ORDER — AMOXICILLIN-POT CLAVULANATE 875-125 MG PO TABS: 1.0000 | ORAL_TABLET | Freq: Two times a day (BID) | ORAL | Status: DC

## 2015-07-18 NOTE — Progress Notes (Signed)
BP 148/77 mmHg  Pulse 109  Temp(Src) 98.5 F (36.9 C)  Ht 5\' 1"  (1.549 m)  Wt 128 lb 12.8 oz (58.423 kg)  BMI 24.35 kg/m2  SpO2 97%  LMP 11/28/1971 (Approximate)   Subjective:    Patient ID: Karen Velasquez, female    DOB: 1930-08-15, 82 y.o.   MRN: [id number]  HPI: Karen Velasquez is a 82 y.o. female  Chief Complaint  Patient presents with  . URI    pt states she has a runny nose, nasal congestion, headache, and cough. Patient states symptoms started 2 weeks ago.    Sinusitis: Patient presents with runny nose, congestion, sneezing, headache, and cough. Patient was sick about a month ago and went to urgent care and was diagnosed with sinus infection and prescribed amoxicillin and tussionex pennkinetic ER. Finished up last amoxicillin pill 07/03/15 and felt good for a couple of days. Current symptoms started then and have not improved.     Relevant past medical, surgical, family and social history reviewed and updated as indicated. Interim medical history since our last visit reviewed. Allergies and medications reviewed and updated.  Review of Systems  Constitutional: Positive for fatigue. Negative for fever.  HENT: Positive for congestion, rhinorrhea, sinus pressure and sneezing.   Eyes: Negative.   Respiratory: Positive for cough.   Cardiovascular: Negative.   Gastrointestinal: Negative.   Endocrine: Negative.   Genitourinary: Negative.   Musculoskeletal: Negative.   Skin: Negative.   Allergic/Immunologic: Negative.   Neurological: Positive for headaches.  Hematological: Negative.   Psychiatric/Behavioral: Negative.     Per HPI unless specifically indicated above     Objective:    BP 148/77 mmHg  Pulse 109  Temp(Src) 98.5 F (36.9 C)  Ht 5\' 1"  (1.549 m)  Wt 128 lb 12.8 oz (58.423 kg)  BMI 24.35 kg/m2  SpO2 97%  LMP 11/28/1971 (Approximate)  Wt Readings from Last 3 Encounters:  07/18/15 128 lb 12.8 oz (58.423 kg)  07/03/15 131 lb 6.4 oz (59.603 kg)  06/24/15  130 lb (58.968 kg)    Physical Exam  Constitutional: She is oriented to person, place, and time. She appears well-developed and well-nourished. No distress.  HENT:  Head: Normocephalic and atraumatic.  Right Ear: Tympanic membrane and ear canal normal.  Left Ear: Tympanic membrane and ear canal normal.  Nose: Mucosal edema and rhinorrhea present. Right sinus exhibits no maxillary sinus tenderness and no frontal sinus tenderness. Left sinus exhibits no maxillary sinus tenderness and no frontal sinus tenderness.  Eyes: Conjunctivae and lids are normal. Right eye exhibits no discharge. Left eye exhibits no discharge. No scleral icterus.  Cardiovascular: Normal rate and regular rhythm.   Pulmonary/Chest: Effort normal and breath sounds normal. No respiratory distress.  Abdominal: Normal appearance. There is no splenomegaly or hepatomegaly.  Musculoskeletal: Normal range of motion.  Neurological: She is alert and oriented to person, place, and time.  Skin: Skin is intact. No rash noted. No pallor.  Psychiatric: She has a normal mood and affect. Her behavior is normal. Judgment and thought content normal.    Results for orders placed or performed in visit on 12/29/14  HM COLONOSCOPY  Result Value Ref Range   HM Colonoscopy normal       Assessment & Plan:   Problem List Items Addressed This Visit    None    Visit Diagnoses    Acute sinusitis, recurrence not specified, unspecified location    -  Primary    Prescribed Augmentin 875-125 mg for  10 days.     Relevant Medications    amoxicillin-clavulanate (AUGMENTIN) 875-125 MG tablet    benzonatate (TESSALON) 100 MG capsule    Cough        Patient stated previously prescribed Tussionex Pennkinetic ER didn't alleviate cough. Prescribed Tesslon perles today.         Follow up plan: Return if symptoms worsen or fail to improve.

## 2015-07-30 ENCOUNTER — Other Ambulatory Visit: Payer: Self-pay | Admitting: Unknown Physician Specialty

## 2015-08-14 ENCOUNTER — Encounter: Payer: Self-pay | Admitting: Unknown Physician Specialty

## 2015-08-14 ENCOUNTER — Ambulatory Visit (INDEPENDENT_AMBULATORY_CARE_PROVIDER_SITE_OTHER): Payer: Medicare Other | Admitting: Unknown Physician Specialty

## 2015-08-14 ENCOUNTER — Ambulatory Visit: Payer: Medicare Other | Admitting: Unknown Physician Specialty

## 2015-08-14 VITALS — BP 123/72 | HR 81 | Temp 98.2°F | Ht 60.8 in | Wt 129.2 lb

## 2015-08-14 DIAGNOSIS — E785 Hyperlipidemia, unspecified: Secondary | ICD-10-CM

## 2015-08-14 DIAGNOSIS — I10 Essential (primary) hypertension: Secondary | ICD-10-CM

## 2015-08-14 DIAGNOSIS — Z79890 Hormone replacement therapy (postmenopausal): Secondary | ICD-10-CM | POA: Diagnosis not present

## 2015-08-14 DIAGNOSIS — N951 Menopausal and female climacteric states: Secondary | ICD-10-CM | POA: Diagnosis not present

## 2015-08-14 MED ORDER — ESTRADIOL 0.5 MG PO TABS: 0.5000 mg | ORAL_TABLET | Freq: Every day | ORAL | Status: DC

## 2015-08-14 NOTE — Assessment & Plan Note (Signed)
Stable, continue present medications.   

## 2015-08-14 NOTE — Progress Notes (Signed)
BP 123/72 mmHg  Pulse 81  Temp(Src) 98.2 F (36.8 C)  Ht 5' 0.8" (1.544 m)  Wt 129 lb 3.2 oz (58.605 kg)  BMI 24.58 kg/m2  SpO2 99%  LMP 11/28/1971 (Approximate)   Subjective:    Patient ID: Karen Velasquez, female    DOB: [birth year], 82 y.o.   MRN: [id number]  HPI: Karen Velasquez is a 82 y.o. female  Chief Complaint  Patient presents with  . Hot Flashes    pt states she feels like her hot flashes are the same as they were before   Menopause: Hormones were stopped and placed on Sertraline to help with hot flashes.  States the Sertraline has not helped a lot.  She would like to consider restarting the hormones.  She feels the hot flashes are miserable and has them multiple times during the day but they do not wake her up.  She feels it is a quality of life history.    Hypertension Using medications without difficulty Average home BPs Not checking  No problems or lightheadedness No chest pain with exertion or shortness of breath No Edema   Hyperlipidemia Using medications without problems No Muscle aches  Diet compliance: Watches what she eats Exercise: Not much      Relevant past medical, surgical, family and social history reviewed and updated as indicated. Interim medical history since our last visit reviewed. Allergies and medications reviewed and updated.  Review of Systems  Per HPI unless specifically indicated above     Objective:    BP 123/72 mmHg  Pulse 81  Temp(Src) 98.2 F (36.8 C)  Ht 5' 0.8" (1.544 m)  Wt 129 lb 3.2 oz (58.605 kg)  BMI 24.58 kg/m2  SpO2 99%  LMP 11/28/1971 (Approximate)  Wt Readings from Last 3 Encounters:  08/14/15 129 lb 3.2 oz (58.605 kg)  07/18/15 128 lb 12.8 oz (58.423 kg)  07/03/15 131 lb 6.4 oz (59.603 kg)    Physical Exam  Constitutional: She is oriented to person, place, and time. She appears well-developed and well-nourished. No distress.  HENT:  Head: Normocephalic and atraumatic.  Eyes: Conjunctivae and lids are  normal. Right eye exhibits no discharge. Left eye exhibits no discharge. No scleral icterus.  Neck: Normal range of motion. Neck supple. No JVD present. Carotid bruit is not present.  Cardiovascular: Normal rate, regular rhythm and normal heart sounds.   Pulmonary/Chest: Effort normal and breath sounds normal.  Abdominal: Normal appearance. There is no splenomegaly or hepatomegaly.  Musculoskeletal: Normal range of motion.  Neurological: She is alert and oriented to person, place, and time.  Skin: Skin is warm, dry and intact. No rash noted. No pallor.  Psychiatric: She has a normal mood and affect. Her behavior is normal. Judgment and thought content normal.    Results for orders placed or performed in visit on 12/29/14  HM COLONOSCOPY  Result Value Ref Range   HM Colonoscopy normal       Assessment & Plan:   Problem List Items Addressed This Visit      Unprioritized   Hypertension    Stable, continue present medications.        Relevant Orders   Comprehensive metabolic panel   Lipid Panel w/o Chol/HDL Ratio   Hot flashes, menopausal - Primary    Stop Sertraline.  Risks and benefits of HRT discussed.  Pt has elected to restart HRT.  Unable to order mammogram due to medicare rules.  Continue self breast examinations and notify of any  changes.        Hyperlipidemia    Check lipid panel      Relevant Orders   Lipid Panel w/o Chol/HDL Ratio    Other Visit Diagnoses    Hormone replacement therapy            Follow up plan: Return in about 6 months (around 02/14/2016) for physical.

## 2015-08-14 NOTE — Assessment & Plan Note (Addendum)
Stop Sertraline.  Risks and benefits of HRT discussed.  Pt has elected to restart HRT.  Unable to order mammogram due to medicare rules.  Continue self breast examinations and notify of any changes.

## 2015-08-14 NOTE — Assessment & Plan Note (Signed)
Check lipid panel  

## 2015-08-15 ENCOUNTER — Encounter: Payer: Self-pay | Admitting: Unknown Physician Specialty

## 2015-08-15 LAB — COMPREHENSIVE METABOLIC PANEL
A/G RATIO: 1.6 (ref 1.1–2.5)
ALT: 10 IU/L (ref 0–32)
AST: 15 IU/L (ref 0–40)
Albumin: 4 g/dL (ref 3.5–4.7)
Alkaline Phosphatase: 40 IU/L (ref 39–117)
BILIRUBIN TOTAL: 0.3 mg/dL (ref 0.0–1.2)
BUN / CREAT RATIO: 25 (ref 11–26)
BUN: 22 mg/dL (ref 8–27)
CALCIUM: 9.5 mg/dL (ref 8.7–10.3)
CHLORIDE: 105 mmol/L (ref 96–106)
CO2: 23 mmol/L (ref 18–29)
Creatinine, Ser: 0.88 mg/dL (ref 0.57–1.00)
GFR, EST AFRICAN AMERICAN: 71 mL/min/{1.73_m2} (ref >59)
GFR, EST NON AFRICAN AMERICAN: 61 mL/min/{1.73_m2} (ref >59)
GLOBULIN, TOTAL: 2.5 g/dL (ref 1.5–4.5)
Glucose: 101 mg/dL — ABNORMAL HIGH (ref 65–99)
POTASSIUM: 4.6 mmol/L (ref 3.5–5.2)
SODIUM: 143 mmol/L (ref 134–144)
TOTAL PROTEIN: 6.5 g/dL (ref 6.0–8.5)

## 2015-08-15 LAB — LIPID PANEL W/O CHOL/HDL RATIO
Cholesterol, Total: 139 mg/dL (ref 100–199)
HDL: 66 mg/dL (ref >39)
LDL Calculated: 62 mg/dL (ref 0–99)
Triglycerides: 55 mg/dL (ref 0–149)
VLDL Cholesterol Cal: 11 mg/dL (ref 5–40)

## 2015-08-20 ENCOUNTER — Telehealth: Payer: Self-pay | Admitting: Unknown Physician Specialty

## 2015-08-20 NOTE — Telephone Encounter (Signed)
Pt would like to have estradol sent to [facility] instead of [facility].

## 2015-08-20 NOTE — Telephone Encounter (Signed)
Called in prescription to [facility] since it was already in the chart. Then I called the patient and let her know.

## 2015-08-23 ENCOUNTER — Ambulatory Visit (INDEPENDENT_AMBULATORY_CARE_PROVIDER_SITE_OTHER): Payer: Medicare Other | Admitting: Family Medicine

## 2015-08-23 ENCOUNTER — Encounter: Payer: Self-pay | Admitting: Family Medicine

## 2015-08-23 VITALS — BP 128/74 | HR 85 | Temp 98.2°F | Ht 60.3 in | Wt 129.0 lb

## 2015-08-23 DIAGNOSIS — J441 Chronic obstructive pulmonary disease with (acute) exacerbation: Secondary | ICD-10-CM

## 2015-08-23 DIAGNOSIS — R05 Cough: Secondary | ICD-10-CM

## 2015-08-23 DIAGNOSIS — J302 Other seasonal allergic rhinitis: Secondary | ICD-10-CM | POA: Diagnosis not present

## 2015-08-23 DIAGNOSIS — J449 Chronic obstructive pulmonary disease, unspecified: Secondary | ICD-10-CM | POA: Insufficient documentation

## 2015-08-23 DIAGNOSIS — R059 Cough, unspecified: Secondary | ICD-10-CM

## 2015-08-23 MED ORDER — FLUTICASONE-SALMETEROL 250-50 MCG/DOSE IN AEPB: 1.0000 | INHALATION_SPRAY | Freq: Two times a day (BID) | RESPIRATORY_TRACT | Status: DC

## 2015-08-23 MED ORDER — PREDNISONE 10 MG PO TABS: ORAL_TABLET | ORAL | Status: DC

## 2015-08-23 MED ORDER — AZITHROMYCIN 250 MG PO TABS: ORAL_TABLET | ORAL | Status: DC

## 2015-08-23 MED ORDER — FEXOFENADINE HCL 180 MG PO TABS: 180.0000 mg | ORAL_TABLET | Freq: Every day | ORAL | Status: DC

## 2015-08-23 MED ORDER — ALBUTEROL SULFATE HFA 108 (90 BASE) MCG/ACT IN AERS: 2.0000 | INHALATION_SPRAY | Freq: Four times a day (QID) | RESPIRATORY_TRACT | Status: DC | PRN

## 2015-08-23 NOTE — Patient Instructions (Addendum)
Start medicine for your breathing issue- pills you can stop after the 6 days you're on them.  The purple inhaler (Advair) take 2x a day every day and wash your mouth out with water after using it.  The little inhaler (albuterol) only take if you have a coughing fit or feel short of breath, it's to rescue you if you're feeling bad  The allegra is the allergy pill- if it makes you feel better with your runny nose and sneezing, take it daily until the weather changes, then you can try coming off of it.   Allergic Rhinitis Allergic rhinitis is when the mucous membranes in the nose respond to allergens. Allergens are particles in the air that cause your body to have an allergic reaction. This causes you to release allergic antibodies. Through a chain of events, these eventually cause you to release histamine into the blood stream. Although meant to protect the body, it is this release of histamine that causes your discomfort, such as frequent sneezing, congestion, and an itchy, runny nose.  CAUSES Seasonal allergic rhinitis (hay fever) is caused by pollen allergens that may come from grasses, trees, and weeds. Year-round allergic rhinitis (perennial allergic rhinitis) is caused by allergens such as house dust mites, pet dander, and mold spores. SYMPTOMS  Nasal stuffiness (congestion).  Itchy, runny nose with sneezing and tearing of the eyes. DIAGNOSIS Your health care provider can help you determine the allergen or allergens that trigger your symptoms. If you and your health care provider are unable to determine the allergen, skin or blood testing may be used. Your health care provider will diagnose your condition after taking your health history and performing a physical exam. Your health care provider may assess you for other related conditions, such as asthma, pink eye, or an ear infection. TREATMENT Allergic rhinitis does not have a cure, but it can be controlled by:  Medicines that block allergy  symptoms. These may include allergy shots, nasal sprays, and oral antihistamines.  Avoiding the allergen. Hay fever may often be treated with antihistamines in pill or nasal spray forms. Antihistamines block the effects of histamine. There are over-the-counter medicines that may help with nasal congestion and swelling around the eyes. Check with your health care provider before taking or giving this medicine. If avoiding the allergen or the medicine prescribed do not work, there are many new medicines your health care provider can prescribe. Stronger medicine may be used if initial measures are ineffective. Desensitizing injections can be used if medicine and avoidance does not work. Desensitization is when a patient is given ongoing shots until the body becomes less sensitive to the allergen. Make sure you follow up with your health care provider if problems continue. HOME CARE INSTRUCTIONS It is not possible to completely avoid allergens, but you can reduce your symptoms by taking steps to limit your exposure to them. It helps to know exactly what you are allergic to so that you can avoid your specific triggers. SEEK MEDICAL CARE IF: 1. You have a fever. 2. You develop a cough that does not stop easily (persistent). 3. You have shortness of breath. 4. You start wheezing. 5. Symptoms interfere with normal daily activities.   This information is not intended to replace advice given to you by your health care provider. Make sure you discuss any questions you have with your health care provider.   Document Released: 02/18/2001 Document Revised: 06/16/2014 Document Reviewed: 01/31/2013 Elsevier Interactive Patient Education 2016 [employer] Chronic Obstructive Pulmonary  Disease Chronic obstructive pulmonary disease (COPD) is a common lung problem. In COPD, the flow of air from the lungs is limited. The way your lungs work will probably never return to normal, but there are things you can do to  improve your lungs and make yourself feel better. Your doctor may treat your condition with:  Medicines.  Oxygen.  Lung surgery.  Changes to your diet.  Rehabilitation. This may involve a team of specialists. HOME CARE  Take all medicines as told by your doctor.  Avoid medicines or cough syrups that dry up your airway (such as antihistamines) and do not allow you to get rid of thick spit. You do not need to avoid them if told differently by your doctor.  If you smoke, stop. Smoking makes the problem worse.  Avoid being around things that make your breathing worse (like smoke, chemicals, and fumes).  Use oxygen therapy and therapy to help improve your lungs (pulmonary rehabilitation) if told by your doctor. If you need home oxygen therapy, ask your doctor if you should buy a tool to measure your oxygen level (oximeter).  Avoid people who have a sickness you can catch (contagious).  Avoid going outside when it is very hot, cold, or humid.  Eat healthy foods. Eat smaller meals more often. Rest before meals.  Stay active, but remember to also rest.  Make sure to get all the shots (vaccines) your doctor recommends. Ask your doctor if you need a pneumonia shot.  Learn and use tips on how to relax.  Learn and use tips on how to control your breathing as told by your doctor. Try:  Breathing in (inhaling) through your nose for 1 second. Then, pucker your lips and breath out (exhale) through your lips for 2 seconds.  Putting one hand on your belly (abdomen). Breathe in slowly through your nose for 1 second. Your hand on your belly should move out. Pucker your lips and breathe out slowly through your lips. Your hand on your belly should move in as you breathe out.  Learn and use controlled coughing to clear thick spit from your lungs. The steps are: 6. Lean your head a little forward. 7. Breathe in deeply. 8. Try to hold your breath for 3 seconds. 9. Keep your mouth slightly open  while coughing 2 times. 10. Spit any thick spit out into a tissue. 11. Rest and do the steps again 1 or 2 times as needed. GET HELP IF:  You cough up more thick spit than usual.  There is a change in the color or thickness of the spit.  It is harder to breathe than usual.  Your breathing is faster than usual. GET HELP RIGHT AWAY IF:  You have shortness of breath while resting.  You have shortness of breath that stops you from:  Being able to talk.  Doing normal activities.  You chest hurts for longer than 5 minutes.  Your skin color is more blue than usual.  Your pulse oximeter shows that you have low oxygen for longer than 5 minutes. MAKE SURE YOU:  Understand these instructions.  Will watch your condition.  Will get help right away if you are not doing well or get worse.   This information is not intended to replace advice given to you by your health care provider. Make sure you discuss any questions you have with your health care provider.   Document Released: 11/12/2007 Document Revised: 06/16/2014 Document Reviewed: 01/20/2013 Elsevier Interactive Patient Education 2016  [employer].

## 2015-08-23 NOTE — Progress Notes (Signed)
BP 128/74 mmHg  Pulse 85  Temp(Src) 98.2 F (36.8 C)  Ht 5' 0.3" (1.532 m)  Wt 129 lb (58.514 kg)  BMI 24.93 kg/m2  SpO2 98%  LMP 11/28/1971 (Approximate)   Subjective:    Patient ID: Karen Velasquez, female    DOB: [birth year], 82 y.o.   MRN: [id number]  HPI: Karen Velasquez is a 82 y.o. female  Chief Complaint  Patient presents with  . URI    Patient states that she has been sick since January, it has went away and come back.   UPPER RESPIRATORY TRACT INFECTION- was sick in January and February got better for a couple of days and then started feeling sick again. Has had 2 courses of antibiotics x 10 days. Amoxicillin first, then augmentin, felt better for about a week, Is not sure if she has allergies[place] she has been here Duration: since Sunday night Worst symptom: sneezing, runny nose Fever: no Cough: yes- worse at night, worse with talking a lot Shortness of breath: no Wheezing: no Chest pain: no Chest tightness: no Chest congestion: no Nasal congestion: yes Runny nose: yes Post nasal drip: yes Sneezing: yes Sore throat: no Swollen glands: yes Sinus pressure: no Headache: yes Face pain: no Toothache: no Ear pain: no  Ear pressure: no  Eyes red/itching:no Eye drainage/crusting: no  Vomiting: no Rash: no Fatigue: yes Sick contacts: yes Strep contacts: no  Context: stable Recurrent sinusitis: yes Relief with OTC cold/cough medications: no  Treatments attempted: cold/sinus, mucinex, anti-histamine, pseudoephedrine, cough syrup and antibiotics   Relevant past medical, surgical, family and social history reviewed and updated as indicated. Interim medical history since our last visit reviewed. Allergies and medications reviewed and updated.  Review of Systems  Constitutional: Negative.   HENT: Positive for congestion, postnasal drip, rhinorrhea, sneezing and sore throat. Negative for dental problem,  drooling, ear discharge, ear pain, facial swelling, hearing loss, mouth sores, nosebleeds, sinus pressure, tinnitus, trouble swallowing and voice change.   Respiratory: Positive for cough. Negative for apnea, choking, chest tightness, shortness of breath, wheezing and stridor.   Cardiovascular: Negative.   Psychiatric/Behavioral: Negative.     Per HPI unless specifically indicated above     Objective:    BP 128/74 mmHg  Pulse 85  Temp(Src) 98.2 F (36.8 C)  Ht 5' 0.3" (1.532 m)  Wt 129 lb (58.514 kg)  BMI 24.93 kg/m2  SpO2 98%  LMP 11/28/1971 (Approximate)  Wt Readings from Last 3 Encounters:  08/23/15 129 lb (58.514 kg)  08/14/15 129 lb 3.2 oz (58.605 kg)  07/18/15 128 lb 12.8 oz (58.423 kg)    Physical Exam  Constitutional: She is oriented to person, place, and time. She appears well-developed and well-nourished. No distress.  HENT:  Head: Normocephalic and atraumatic.  Right Ear: Hearing and external ear normal.  Left Ear: Hearing and external ear normal.  Nose: Nose normal.  Mouth/Throat: Oropharynx is clear and moist. No oropharyngeal exudate.  Eyes: Conjunctivae, EOM and lids are normal. Pupils are equal, round, and reactive to light. Right eye exhibits no discharge. Left eye exhibits no discharge. No scleral icterus.  Neck: Normal range of motion. Neck supple. No JVD present. No tracheal deviation present. No thyromegaly present.  Cardiovascular: Normal rate, regular rhythm, normal heart sounds and intact distal pulses.  Exam reveals no gallop and no friction rub.   No murmur heard. Pulmonary/Chest: Effort normal. No stridor. No respiratory distress. She has decreased breath  sounds in the right upper field, the right middle field, the right lower field, the left upper field, the left middle field and the left lower field. She has no wheezes. She has no rhonchi. She has no rales. She exhibits no tenderness.  Wet, hacking cough with talking  Musculoskeletal: Normal range  of motion.  Lymphadenopathy:    She has cervical adenopathy.  Neurological: She is alert and oriented to person, place, and time.  Skin: Skin is warm, dry and intact. No rash noted. She is not diaphoretic. No erythema. No pallor.  Psychiatric: She has a normal mood and affect. Her speech is normal and behavior is normal. Judgment and thought content normal. Cognition and memory are normal.  Nursing note and vitals reviewed.   Results for orders placed or performed in visit on 08/14/15  Comprehensive metabolic panel  Result Value Ref Range   Glucose 101 (H) 65 - 99 mg/dL   BUN 22 8 - 27 mg/dL   Creatinine, Ser 0.88 0.57 - 1.00 mg/dL   GFR calc non Af Amer 61 >59 mL/min/1.73   GFR calc Af Amer 71 >59 mL/min/1.73   BUN/Creatinine Ratio 25 11 - 26   Sodium 143 134 - 144 mmol/L   Potassium 4.6 3.5 - 5.2 mmol/L   Chloride 105 96 - 106 mmol/L   CO2 23 18 - 29 mmol/L   Calcium 9.5 8.7 - 10.3 mg/dL   Total Protein 6.5 6.0 - 8.5 g/dL   Albumin 4.0 3.5 - 4.7 g/dL   Globulin, Total 2.5 1.5 - 4.5 g/dL   Albumin/Globulin Ratio 1.6 1.1 - 2.5   Bilirubin Total 0.3 0.0 - 1.2 mg/dL   Alkaline Phosphatase 40 39 - 117 IU/L   AST 15 0 - 40 IU/L   ALT 10 0 - 32 IU/L  Lipid Panel w/o Chol/HDL Ratio  Result Value Ref Range   Cholesterol, Total 139 100 - 199 mg/dL   Triglycerides 55 0 - 149 mg/dL   HDL 66 >39 mg/dL   VLDL Cholesterol Cal 11 5 - 40 mg/dL   LDL Calculated 62 0 - 99 mg/dL      Assessment & Plan:   Problem List Items Addressed This Visit      Respiratory   COPD (chronic obstructive pulmonary disease) (HCC) - Primary   Relevant Medications   fexofenadine (ALLEGRA) 180 MG tablet   predniSONE (DELTASONE) 10 MG tablet   azithromycin (ZITHROMAX) 250 MG tablet   Fluticasone-Salmeterol (ADVAIR DISKUS) 250-50 MCG/DOSE AEPB   albuterol (PROVENTIL HFA;VENTOLIN HFA) 108 (90 Base) MCG/ACT inhaler    Other Visit Diagnoses    Other seasonal allergic rhinitis        Will start allegra and  see how she does with sneezing and runny nose. Call with any problems.     Cough        COPD on spirometry today. Will treat accordingly.    Relevant Orders    Spirometry with Graph (Completed)        Follow up plan: Return in about 4 weeks (around 09/20/2015), or with me or Malachy Mood, for Follow up COPD with spirometry.

## 2015-09-18 ENCOUNTER — Other Ambulatory Visit: Payer: Self-pay | Admitting: Family Medicine

## 2015-09-20 ENCOUNTER — Ambulatory Visit (INDEPENDENT_AMBULATORY_CARE_PROVIDER_SITE_OTHER): Payer: Medicare Other | Admitting: Family Medicine

## 2015-09-20 ENCOUNTER — Encounter: Payer: Self-pay | Admitting: Family Medicine

## 2015-09-20 ENCOUNTER — Telehealth: Payer: Self-pay | Admitting: Family Medicine

## 2015-09-20 ENCOUNTER — Ambulatory Visit
Admission: RE | Admit: 2015-09-20 | Discharge: 2015-09-20 | Disposition: A | Discharge status: Home / Self Care | Payer: Medicare Other | Source: Ambulatory Visit | Attending: Family Medicine | Admitting: Family Medicine

## 2015-09-20 VITALS — BP 123/71 | HR 62 | Temp 98.8°F | Ht 60.3 in | Wt 131.0 lb

## 2015-09-20 DIAGNOSIS — M1611 Unilateral primary osteoarthritis, right hip: Secondary | ICD-10-CM | POA: Diagnosis not present

## 2015-09-20 DIAGNOSIS — M25551 Pain in right hip: Secondary | ICD-10-CM | POA: Insufficient documentation

## 2015-09-20 DIAGNOSIS — J441 Chronic obstructive pulmonary disease with (acute) exacerbation: Secondary | ICD-10-CM

## 2015-09-20 DIAGNOSIS — J302 Other seasonal allergic rhinitis: Secondary | ICD-10-CM

## 2015-09-20 DIAGNOSIS — I700 Atherosclerosis of aorta: Secondary | ICD-10-CM | POA: Insufficient documentation

## 2015-09-20 DIAGNOSIS — M159 Polyosteoarthritis, unspecified: Secondary | ICD-10-CM

## 2015-09-20 DIAGNOSIS — M150 Primary generalized (osteo)arthritis: Principal | ICD-10-CM

## 2015-09-20 DIAGNOSIS — J309 Allergic rhinitis, unspecified: Secondary | ICD-10-CM | POA: Insufficient documentation

## 2015-09-20 MED ORDER — FLUTICASONE-SALMETEROL 250-50 MCG/DOSE IN AEPB: INHALATION_SPRAY | RESPIRATORY_TRACT | Status: DC

## 2015-09-20 MED ORDER — LORATADINE 10 MG PO TABS: 10.0000 mg | ORAL_TABLET | Freq: Every day | ORAL | Status: DC

## 2015-09-20 MED ORDER — NAPROXEN 500 MG PO TABS: 500.0000 mg | ORAL_TABLET | Freq: Two times a day (BID) | ORAL | Status: DC

## 2015-09-20 NOTE — Telephone Encounter (Signed)
Please let her know that her x-ray showed a lot of arthritis. We can have her take the medicine and she can see orthopedics if she'd like. If she would, let me know and I'll put in the referral

## 2015-09-20 NOTE — Assessment & Plan Note (Signed)
Will start claritin as allegra was too strong for her. Call with any concerns. Call if not better.

## 2015-09-20 NOTE — Progress Notes (Signed)
BP 123/71 mmHg  Pulse 62  Temp(Src) 98.8 F (37.1 C)  Ht 5' 0.3" (1.532 m)  Wt 131 lb (59.421 kg)  BMI 25.32 kg/m2  SpO2 98%  LMP 11/28/1971 (Approximate)   Subjective:    Patient ID: Karen Velasquez, female    DOB: 07/26/1930, 82 y.o.   MRN: [id number]  HPI: Karen Velasquez is a 82 y.o. female  Chief Complaint  Patient presents with  . COPD  . Hip Pain    Right hip pain   Allegra made her swimmy headed  Has been doing OK with her breathing until Sunday  UPPER RESPIRATORY TRACT INFECTION Duration: Since Sunday Worst symptom: Cough Fever: no Cough: yes Shortness of breath: yes Wheezing: no Chest pain: yes, with cough Chest tightness: no Chest congestion: no Nasal congestion: yes Runny nose: yes Post nasal drip: yes Sneezing: yes Sore throat: yes Swollen glands: no Sinus pressure: no Headache: no Face pain: no Toothache: no Ear pain: no  Ear pressure: no  Eyes red/itching:no Eye drainage/crusting: no  Vomiting: no Rash: no Fatigue: yes Sick contacts: no Strep contacts: no  Context: stable Recurrent sinusitis: no Relief with OTC cold/cough medications: no  Treatments attempted: none   HIP PAIN- had surgery for spinal stenosis about 3-4 years ago and it went away, came back a couple of months ago, then got worse about 2 weeks ago Duration: chronic, worse in the past 2 weeks Involved hip: right  Mechanism of injury: unknown Location: lateral Onset: sudden  Severity: severe  Quality: severe and aching and "pain" Frequency: constant Radiation: yes into her leg and into her backside Aggravating factors: weight bearing and walking   Alleviating factors: nothing  Status: worse Treatments attempted: rest, ice, heat, APAP, ibuprofen and aleve   Relief with NSAIDs?: mild Weakness with weight bearing: yes Weakness with walking: yes Paresthesias / decreased sensation: no Swelling: no Redness:no Fevers: no   Relevant past medical, surgical, family and social  history reviewed and updated as indicated. Interim medical history since our last visit reviewed. Allergies and medications reviewed and updated.  Review of Systems  Constitutional: Negative.   HENT: Positive for congestion, postnasal drip, rhinorrhea, sinus pressure, sneezing and sore throat. Negative for dental problem, drooling, ear discharge, ear pain, facial swelling, hearing loss, mouth sores, nosebleeds, tinnitus, trouble swallowing and voice change.   Respiratory: Positive for cough, chest tightness and shortness of breath. Negative for apnea, choking, wheezing and stridor.   Cardiovascular: Negative.   Musculoskeletal: Positive for myalgias, back pain, joint swelling, arthralgias and gait problem. Negative for neck pain and neck stiffness.  Skin: Negative.   Psychiatric/Behavioral: Negative.     Per HPI unless specifically indicated above     Objective:    BP 123/71 mmHg  Pulse 62  Temp(Src) 98.8 F (37.1 C)  Ht 5' 0.3" (1.532 m)  Wt 131 lb (59.421 kg)  BMI 25.32 kg/m2  SpO2 98%  LMP 11/28/1971 (Approximate)  Wt Readings from Last 3 Encounters:  09/20/15 131 lb (59.421 kg)  08/23/15 129 lb (58.514 kg)  08/14/15 129 lb 3.2 oz (58.605 kg)    Physical Exam  Constitutional: She is oriented to person, place, and time. She appears well-developed and well-nourished. No distress.  HENT:  Head: Normocephalic and atraumatic.  Right Ear: Hearing, tympanic membrane, external ear and ear canal normal.  Left Ear: Hearing, tympanic membrane, external ear and ear canal normal.  Nose: Mucosal edema and rhinorrhea present. Right sinus exhibits no maxillary sinus tenderness and no frontal  sinus tenderness. Left sinus exhibits no maxillary sinus tenderness and no frontal sinus tenderness.  Mouth/Throat: Uvula is midline, oropharynx is clear and moist and mucous membranes are normal. No oropharyngeal exudate.  Eyes: Conjunctivae, EOM and lids are normal. Pupils are equal, round, and  reactive to light. Right eye exhibits no discharge. Left eye exhibits no discharge. No scleral icterus.  Neck: Normal range of motion. Neck supple. No JVD present. No tracheal deviation present. No thyromegaly present.  Cardiovascular: Normal rate, regular rhythm, normal heart sounds and intact distal pulses.  Exam reveals no gallop and no friction rub.   No murmur heard. Pulmonary/Chest: Effort normal and breath sounds normal. No stridor. No respiratory distress. She has no wheezes. She has no rales. She exhibits no tenderness.  Musculoskeletal: Normal range of motion.  Lymphadenopathy:    She has cervical adenopathy.  Neurological: She is alert and oriented to person, place, and time.  Skin: Skin is warm, dry and intact. No rash noted. She is not diaphoretic. No erythema. No pallor.  Psychiatric: She has a normal mood and affect. Her speech is normal and behavior is normal. Judgment and thought content normal. Cognition and memory are normal.  Nursing note and vitals reviewed. Hip Exam: Right     Tenderness to palpation:      Greater trochanter: yes      Anterior superior iliac spine: yes     Anterior hip: yes     Iliac crest: no     Iliac tubercle: no     Pubic tubercle: no     SI joint: yes      Range of Motion:     Flexion: Decreased    Extension: Decreased    Abduction: Decreased    Adduction: Decreased    Internal rotation: Decreased    External rotation: Decreased     Muscle Strength:  5/5 bilaterally     Special Tests:    Ober test: negative    FABER test:positive    Results for orders placed or performed in visit on 08/14/15  Comprehensive metabolic panel  Result Value Ref Range   Glucose 101 (H) 65 - 99 mg/dL   BUN 22 8 - 27 mg/dL   Creatinine, Ser 0.88 0.57 - 1.00 mg/dL   GFR calc non Af Amer 61 >59 mL/min/1.73   GFR calc Af Amer 71 >59 mL/min/1.73   BUN/Creatinine Ratio 25 11 - 26   Sodium 143 134 - 144 mmol/L   Potassium 4.6 3.5 - 5.2 mmol/L   Chloride  105 96 - 106 mmol/L   CO2 23 18 - 29 mmol/L   Calcium 9.5 8.7 - 10.3 mg/dL   Total Protein 6.5 6.0 - 8.5 g/dL   Albumin 4.0 3.5 - 4.7 g/dL   Globulin, Total 2.5 1.5 - 4.5 g/dL   Albumin/Globulin Ratio 1.6 1.1 - 2.5   Bilirubin Total 0.3 0.0 - 1.2 mg/dL   Alkaline Phosphatase 40 39 - 117 IU/L   AST 15 0 - 40 IU/L   ALT 10 0 - 32 IU/L  Lipid Panel w/o Chol/HDL Ratio  Result Value Ref Range   Cholesterol, Total 139 100 - 199 mg/dL   Triglycerides 55 0 - 149 mg/dL   HDL 66 >39 mg/dL   VLDL Cholesterol Cal 11 5 - 40 mg/dL   LDL Calculated 62 0 - 99 mg/dL      Assessment & Plan:   Problem List Items Addressed This Visit      Respiratory  COPD (chronic obstructive pulmonary disease) (HCC) - Primary   Relevant Medications   loratadine (CLARITIN) 10 MG tablet   Fluticasone-Salmeterol (ADVAIR DISKUS) 250-50 MCG/DOSE AEPB   Other Relevant Orders   Spirometry with Graph (Completed)   Allergic rhinitis    Will start claritin as allegra was too strong for her. Call with any concerns. Call if not better.        Other Visit Diagnoses    Right hip pain        Will get x-ray. Start naproxen. Await results. Will see if needs to see ortho or spine.     Relevant Orders    DG HIP UNILAT WITH PELVIS 2-3 VIEWS RIGHT        Follow up plan: Return in about 4 weeks (around 10/18/2015).

## 2015-09-20 NOTE — Telephone Encounter (Signed)
Referral put in.

## 2015-09-20 NOTE — Telephone Encounter (Signed)
Patient notified, she states that if you think that she needs to go, then she will do whatever you recommend, I told her that usually we send them to ortho for the arthritis.

## 2015-09-25 DIAGNOSIS — M7061 Trochanteric bursitis, right hip: Secondary | ICD-10-CM | POA: Diagnosis not present

## 2015-10-02 DIAGNOSIS — H2512 Age-related nuclear cataract, left eye: Secondary | ICD-10-CM | POA: Diagnosis not present

## 2015-10-02 DIAGNOSIS — H1851 Endothelial corneal dystrophy: Secondary | ICD-10-CM | POA: Diagnosis not present

## 2015-10-10 DIAGNOSIS — M4806 Spinal stenosis, lumbar region: Secondary | ICD-10-CM | POA: Diagnosis not present

## 2015-10-10 DIAGNOSIS — M5416 Radiculopathy, lumbar region: Secondary | ICD-10-CM | POA: Diagnosis not present

## 2015-10-10 DIAGNOSIS — M5126 Other intervertebral disc displacement, lumbar region: Secondary | ICD-10-CM | POA: Diagnosis not present

## 2015-10-10 DIAGNOSIS — M7061 Trochanteric bursitis, right hip: Secondary | ICD-10-CM | POA: Diagnosis not present

## 2015-10-10 DIAGNOSIS — M4807 Spinal stenosis, lumbosacral region: Secondary | ICD-10-CM | POA: Diagnosis not present

## 2015-10-10 DIAGNOSIS — M5136 Other intervertebral disc degeneration, lumbar region: Secondary | ICD-10-CM | POA: Diagnosis not present

## 2015-10-10 DIAGNOSIS — M47816 Spondylosis without myelopathy or radiculopathy, lumbar region: Secondary | ICD-10-CM | POA: Diagnosis not present

## 2015-10-10 DIAGNOSIS — M47817 Spondylosis without myelopathy or radiculopathy, lumbosacral region: Secondary | ICD-10-CM | POA: Diagnosis not present

## 2015-10-10 DIAGNOSIS — M4316 Spondylolisthesis, lumbar region: Secondary | ICD-10-CM | POA: Diagnosis not present

## 2015-10-16 DIAGNOSIS — M5416 Radiculopathy, lumbar region: Secondary | ICD-10-CM | POA: Diagnosis not present

## 2015-10-18 ENCOUNTER — Ambulatory Visit (INDEPENDENT_AMBULATORY_CARE_PROVIDER_SITE_OTHER): Payer: Medicare Other | Admitting: Family Medicine

## 2015-10-18 ENCOUNTER — Encounter: Payer: Self-pay | Admitting: Family Medicine

## 2015-10-18 VITALS — BP 107/64 | HR 77 | Temp 98.4°F | Wt 132.0 lb

## 2015-10-18 DIAGNOSIS — J449 Chronic obstructive pulmonary disease, unspecified: Secondary | ICD-10-CM

## 2015-10-18 DIAGNOSIS — R202 Paresthesia of skin: Secondary | ICD-10-CM

## 2015-10-18 DIAGNOSIS — M150 Primary generalized (osteo)arthritis: Secondary | ICD-10-CM | POA: Diagnosis not present

## 2015-10-18 DIAGNOSIS — R252 Cramp and spasm: Secondary | ICD-10-CM

## 2015-10-18 DIAGNOSIS — M159 Polyosteoarthritis, unspecified: Secondary | ICD-10-CM

## 2015-10-18 LAB — CBC WITH DIFFERENTIAL/PLATELET
HEMOGLOBIN: 12.1 g/dL (ref 11.1–15.9)
Hematocrit: 35.7 % (ref 34.0–46.6)
Lymphocytes Absolute: 1 10*3/uL (ref 0.7–3.1)
Lymphs: 15 %
MCH: 31.3 pg (ref 26.6–33.0)
MCHC: 33.9 g/dL (ref 31.5–35.7)
MCV: 92 fL (ref 79–97)
MID (Absolute): 0.3 10*3/uL (ref 0.1–1.6)
MID: 4 %
NEUTROS ABS: 5 10*3/uL (ref 1.4–7.0)
NEUTROS PCT: 81 %
Platelets: 222 10*3/uL (ref 150–379)
RBC: 3.87 x10E6/uL (ref 3.77–5.28)
RDW: 15.2 % (ref 12.3–15.4)
WBC: 6.3 10*3/uL (ref 3.4–10.8)

## 2015-10-18 MED ORDER — NAPROXEN 500 MG PO TABS: 500.0000 mg | ORAL_TABLET | Freq: Two times a day (BID) | ORAL | Status: DC

## 2015-10-18 NOTE — Progress Notes (Signed)
BP 107/64 mmHg  Pulse 77  Temp(Src) 98.4 F (36.9 C)  Wt 132 lb (59.875 kg)  SpO2 95%  LMP 11/28/1971 (Approximate)   Subjective:    Patient ID: Karen Velasquez, female    DOB: 06-Dec-1930, 82 y.o.   MRN: [id number]  HPI: Karen Velasquez is a 82 y.o. female  Chief Complaint  Patient presents with  . Hip Pain  . COPD   HIP PAIN- Has not been taking her naproxen. Seeing orthopedics. They started her on meloxicam. She notes that it is not helping. She is now going to see another doctor for another opinion who specializes in hips. She would like to try something other than the meloxicam for pain as it's not helping.   COPD- doing much better since starting the claritin. Not having problems with her breathing any more. Feels like she is feeling well. No concerns.   Has been having some numbness and cramping in her L foot. Notes that there is arthritis there, and has been having trouble with it. Feels tingly and sends shooting pain from the ball of her L great toe into her anterior calf. Seems to be worse at night, but happens during the day as well. Has been going on for a couple of months, but wanted to mention it today. No other concerns or complaints at this time  Relevant past medical, surgical, family and social history reviewed and updated as indicated. Interim medical history since our last visit reviewed. Allergies and medications reviewed and updated.  Review of Systems  Constitutional: Negative.   Respiratory: Negative.   Cardiovascular: Negative.   Musculoskeletal: Positive for arthralgias.  Skin: Negative.   Neurological: Positive for numbness. Negative for dizziness, tremors, seizures, syncope, facial asymmetry, speech difficulty, weakness, light-headedness and headaches.  Psychiatric/Behavioral: Negative.     Per HPI unless specifically indicated above     Objective:    BP 107/64 mmHg  Pulse 77  Temp(Src) 98.4 F (36.9 C)  Wt 132 lb (59.875 kg)  SpO2 95%  LMP  11/28/1971 (Approximate)  Wt Readings from Last 3 Encounters:  10/18/15 132 lb (59.875 kg)  09/20/15 131 lb (59.421 kg)  08/23/15 129 lb (58.514 kg)    Physical Exam  Constitutional: She is oriented to person, place, and time. She appears well-developed and well-nourished. No distress.  HENT:  Head: Normocephalic and atraumatic.  Right Ear: Hearing normal.  Left Ear: Hearing normal.  Nose: Nose normal.  Eyes: Conjunctivae and lids are normal. Right eye exhibits no discharge. Left eye exhibits no discharge. No scleral icterus.  Cardiovascular: Normal rate, regular rhythm, normal heart sounds and intact distal pulses.  Exam reveals no gallop and no friction rub.   No murmur heard. Pulmonary/Chest: Effort normal and breath sounds normal. No respiratory distress. She has no wheezes. She has no rales. She exhibits no tenderness.  Musculoskeletal:  L MTP arthritis with decreased ROM, tingling over ball of foot, otherwise normal sensation Stiff walking with mildly antalgic gait  Neurological: She is alert and oriented to person, place, and time.  Skin: Skin is warm, dry and intact. No rash noted. No erythema. No pallor.  Psychiatric: She has a normal mood and affect. Her speech is normal and behavior is normal. Judgment and thought content normal. Cognition and memory are normal.  Nursing note and vitals reviewed.   Results for orders placed or performed in visit on 08/14/15  Comprehensive metabolic panel  Result Value Ref Range   Glucose 101 (H) 65 - 99 mg/dL  BUN 22 8 - 27 mg/dL   Creatinine, Ser 0.88 0.57 - 1.00 mg/dL   GFR calc non Af Amer 61 >59 mL/min/1.73   GFR calc Af Amer 71 >59 mL/min/1.73   BUN/Creatinine Ratio 25 11 - 26   Sodium 143 134 - 144 mmol/L   Potassium 4.6 3.5 - 5.2 mmol/L   Chloride 105 96 - 106 mmol/L   CO2 23 18 - 29 mmol/L   Calcium 9.5 8.7 - 10.3 mg/dL   Total Protein 6.5 6.0 - 8.5 g/dL   Albumin 4.0 3.5 - 4.7 g/dL   Globulin, Total 2.5 1.5 - 4.5 g/dL    Albumin/Globulin Ratio 1.6 1.1 - 2.5   Bilirubin Total 0.3 0.0 - 1.2 mg/dL   Alkaline Phosphatase 40 39 - 117 IU/L   AST 15 0 - 40 IU/L   ALT 10 0 - 32 IU/L  Lipid Panel w/o Chol/HDL Ratio  Result Value Ref Range   Cholesterol, Total 139 100 - 199 mg/dL   Triglycerides 55 0 - 149 mg/dL   HDL 66 >39 mg/dL   VLDL Cholesterol Cal 11 5 - 40 mg/dL   LDL Calculated 62 0 - 99 mg/dL      Assessment & Plan:   Problem List Items Addressed This Visit      Respiratory   COPD (chronic obstructive pulmonary disease) ([place])    Doing much better with the claritin. Continue current regimen. Continue to monitor.       Relevant Medications   methylPREDNISolone (MEDROL DOSEPAK) 4 MG TBPK tablet     Musculoskeletal and Integument   Osteoarthritis    Will change her meloxicam, which isn't working, to naproxen. Rx sent to her pharmacy. Follow up with orthopedics. Call with any concerns.       Relevant Medications   methocarbamol (ROBAXIN) 500 MG tablet   methylPREDNISolone (MEDROL DOSEPAK) 4 MG TBPK tablet   naproxen (NAPROSYN) 500 MG tablet    Other Visit Diagnoses    Paresthesias    -  Primary    Likely due to nerve irritation from the arthritis. Will check labs to look for other cause. Start naproxen for anti-inflammatory. Follow up with ortho.     Relevant Orders    CBC With Differential/Platelet    TSH    Comprehensive metabolic panel    Cramps of left lower extremity        Will check labs. Await results.         Follow up plan: Return As needed with [facility].

## 2015-10-18 NOTE — Patient Instructions (Signed)
Stop the meloxicam- start the naproxen for pain in your hip until you get in to see the surgeon.

## 2015-10-18 NOTE — Assessment & Plan Note (Signed)
Doing much better with the claritin. Continue current regimen. Continue to monitor.

## 2015-10-18 NOTE — Assessment & Plan Note (Signed)
Will change her meloxicam, which isn't working, to naproxen. Rx sent to her pharmacy. Follow up with orthopedics. Call with any concerns.

## 2015-10-19 LAB — TSH: TSH: 0.462 u[IU]/mL (ref 0.450–4.500)

## 2015-10-19 LAB — COMPREHENSIVE METABOLIC PANEL
A/G RATIO: 2 (ref 1.2–2.2)
ALK PHOS: 45 IU/L (ref 39–117)
ALT: 17 IU/L (ref 0–32)
AST: 19 IU/L (ref 0–40)
Albumin: 4.4 g/dL (ref 3.5–4.7)
BILIRUBIN TOTAL: 0.3 mg/dL (ref 0.0–1.2)
BUN/Creatinine Ratio: 34 — ABNORMAL HIGH (ref 12–28)
BUN: 33 mg/dL — ABNORMAL HIGH (ref 8–27)
CHLORIDE: 102 mmol/L (ref 96–106)
CO2: 23 mmol/L (ref 18–29)
Calcium: 9.5 mg/dL (ref 8.7–10.3)
Creatinine, Ser: 0.97 mg/dL (ref 0.57–1.00)
GFR calc Af Amer: 62 mL/min/{1.73_m2} (ref >59)
GFR calc non Af Amer: 54 mL/min/{1.73_m2} — ABNORMAL LOW (ref >59)
GLOBULIN, TOTAL: 2.2 g/dL (ref 1.5–4.5)
Glucose: 129 mg/dL — ABNORMAL HIGH (ref 65–99)
POTASSIUM: 4.8 mmol/L (ref 3.5–5.2)
SODIUM: 140 mmol/L (ref 134–144)
Total Protein: 6.6 g/dL (ref 6.0–8.5)

## 2015-10-24 ENCOUNTER — Encounter: Payer: Self-pay | Admitting: Family Medicine

## 2015-10-30 DIAGNOSIS — M5137 Other intervertebral disc degeneration, lumbosacral region: Secondary | ICD-10-CM | POA: Diagnosis not present

## 2015-11-06 DIAGNOSIS — M4806 Spinal stenosis, lumbar region: Secondary | ICD-10-CM | POA: Diagnosis not present

## 2015-11-06 DIAGNOSIS — M5137 Other intervertebral disc degeneration, lumbosacral region: Secondary | ICD-10-CM | POA: Diagnosis not present

## 2015-11-16 ENCOUNTER — Other Ambulatory Visit: Payer: Self-pay | Admitting: Neurosurgery

## 2015-11-16 DIAGNOSIS — M5137 Other intervertebral disc degeneration, lumbosacral region: Secondary | ICD-10-CM

## 2015-11-26 ENCOUNTER — Ambulatory Visit
Admission: RE | Admit: 2015-11-26 | Discharge: 2015-11-26 | Disposition: A | Discharge status: Home / Self Care | Payer: Medicare Other | Source: Ambulatory Visit | Attending: Neurosurgery | Admitting: Neurosurgery

## 2015-11-26 DIAGNOSIS — M545 Low back pain: Secondary | ICD-10-CM | POA: Diagnosis not present

## 2015-11-26 DIAGNOSIS — M5137 Other intervertebral disc degeneration, lumbosacral region: Secondary | ICD-10-CM

## 2015-11-26 MED ORDER — METHYLPREDNISOLONE ACETATE 40 MG/ML INJ SUSP (RADIOLOG
120.0000 mg | Freq: Once | INTRAMUSCULAR | Status: AC
  Administered 2015-11-26: 120 mg via EPIDURAL

## 2015-11-26 MED ORDER — IOPAMIDOL (ISOVUE-M 200) INJECTION 41%
1.0000 mL | Freq: Once | INTRAMUSCULAR | Status: AC
Start: 2015-11-26 — End: 2015-11-26
  Administered 2015-11-26: 1 mL via EPIDURAL

## 2015-11-26 NOTE — Discharge Instructions (Signed)

## 2015-12-06 ENCOUNTER — Other Ambulatory Visit: Payer: Self-pay | Admitting: Unknown Physician Specialty

## 2015-12-25 DIAGNOSIS — M5137 Other intervertebral disc degeneration, lumbosacral region: Secondary | ICD-10-CM | POA: Diagnosis not present

## 2016-01-14 ENCOUNTER — Other Ambulatory Visit: Payer: Self-pay | Admitting: Unknown Physician Specialty

## 2016-01-31 ENCOUNTER — Encounter (INDEPENDENT_AMBULATORY_CARE_PROVIDER_SITE_OTHER): Payer: Self-pay

## 2016-02-12 ENCOUNTER — Other Ambulatory Visit: Payer: Self-pay

## 2016-02-12 MED ORDER — LISINOPRIL-HYDROCHLOROTHIAZIDE 10-12.5 MG PO TABS: 1.0000 | ORAL_TABLET | Freq: Every day | ORAL | 1 refills | Status: DC

## 2016-02-12 NOTE — Telephone Encounter (Signed)
Day supply sent

## 2016-02-12 NOTE — Telephone Encounter (Signed)
Pharmacy is requesting a 90 day supply

## 2016-02-25 ENCOUNTER — Ambulatory Visit (INDEPENDENT_AMBULATORY_CARE_PROVIDER_SITE_OTHER): Payer: Medicare Other | Admitting: Unknown Physician Specialty

## 2016-02-25 ENCOUNTER — Other Ambulatory Visit: Payer: Self-pay | Admitting: Unknown Physician Specialty

## 2016-02-25 ENCOUNTER — Encounter: Payer: Self-pay | Admitting: Unknown Physician Specialty

## 2016-02-25 VITALS — BP 111/68 | HR 76 | Temp 97.8°F | Ht 61.3 in | Wt 132.4 lb

## 2016-02-25 DIAGNOSIS — Z720 Tobacco use: Secondary | ICD-10-CM

## 2016-02-25 DIAGNOSIS — Z23 Encounter for immunization: Secondary | ICD-10-CM

## 2016-02-25 DIAGNOSIS — E785 Hyperlipidemia, unspecified: Secondary | ICD-10-CM | POA: Diagnosis not present

## 2016-02-25 DIAGNOSIS — R5382 Chronic fatigue, unspecified: Secondary | ICD-10-CM | POA: Diagnosis not present

## 2016-02-25 DIAGNOSIS — I10 Essential (primary) hypertension: Secondary | ICD-10-CM

## 2016-02-25 DIAGNOSIS — F17200 Nicotine dependence, unspecified, uncomplicated: Secondary | ICD-10-CM

## 2016-02-25 DIAGNOSIS — R4789 Other speech disturbances: Secondary | ICD-10-CM | POA: Insufficient documentation

## 2016-02-25 DIAGNOSIS — Z0000 Encounter for general adult medical examination without abnormal findings: Secondary | ICD-10-CM

## 2016-02-25 MED ORDER — FLUTICASONE-SALMETEROL 250-50 MCG/DOSE IN AEPB: INHALATION_SPRAY | RESPIRATORY_TRACT | 6 refills | Status: DC

## 2016-02-25 NOTE — Patient Instructions (Addendum)

## 2016-02-25 NOTE — Assessment & Plan Note (Signed)
This is a concern.  No other signs of cognitive decline.  Will continue to monitor

## 2016-02-25 NOTE — Assessment & Plan Note (Signed)
Stable, continue present medications.   

## 2016-02-25 NOTE — Assessment & Plan Note (Signed)
Encouraged to quit. 

## 2016-02-25 NOTE — Assessment & Plan Note (Signed)
Check lipid panel  

## 2016-02-25 NOTE — Telephone Encounter (Signed)
Pt would like a refill on Fluticasone-Salmeterol (ADVAIR DISKUS) 250-50 MCG/DOSE AEPB [facility]

## 2016-02-25 NOTE — Progress Notes (Signed)
BP 111/68 (BP Location: Left Arm, Patient Position: Sitting, Cuff Size: Normal)   Pulse 76   Temp 97.8 F (36.6 C)   Ht 5' 1.3" (1.557 m)   Wt 132 lb 6.4 oz (60.1 kg)   LMP 11/28/1971 (Approximate)   SpO2 97%   BMI 24.77 kg/m    Subjective:    Patient ID: Karen Velasquez, female    DOB: Sep 13, 1930, 82 y.o.   MRN: [id number]  HPI: Karen Velasquez is a 82 y.o. female  Chief Complaint  Patient presents with  . Medicare Wellness   Depression screen [facility] 2/9 02/25/2016 12/29/2014 11/22/2014  Decreased Interest 0 0 (No Data)  Down, Depressed, Hopeless 0 0 0  PHQ - 2 Score 0 0 0   Functional Status Survey: Is the patient deaf or have difficulty hearing?: No Does the patient have difficulty seeing, even when wearing glasses/contacts?: Yes Does the patient have difficulty concentrating, remembering, or making decisions?: Yes Does the patient have difficulty walking or climbing stairs?: No Does the patient have difficulty dressing or bathing?: No Does the patient have difficulty doing errands alone such as visiting a doctor's office or shopping?: No  Fall Risk  02/25/2016 12/29/2014 11/22/2014  Falls in the past year? No No No   Mini cog is negative but pt states she is having trouble with work finding   Menopause:  Hormones were restarted as Sertraline was not working.  She states these are working well  Hypertension Using medications without difficulty Average home BPs Not checking        No problems or lightheadedness No chest pain with exertion or shortness of breath No Edema  Hyperlipidemia Using medications without problems No Muscle aches  Diet compliance: Watches what she eats Exercise: Not much  Neck pain Having a lot of neck pain that is "paralyzing" at times especially when it "catches."  Primarily it is in the right side of the neck.  Admits to arthritis in other parts of her body but "has never hurt like this."  Using Absorbine Jr  COPD States she had a bad  flare last spring and "never recovered."  She lacks energy but no SOB.  Not taking any inhalers.    Social History   Social History  . Marital status: Widowed    Spouse name: N/A  . Number of children: 1  . Years of education: N/A   Occupational History  . retired    Social History Main Topics  . Smoking status: Current Every Day Smoker    Packs/day: 0.50    Types: Cigarettes  . Smokeless tobacco: Never Used  . Alcohol use No  . Drug use: No  . Sexual activity: Not Currently   Other Topics Concern  . Not on file   Social History Narrative  . No narrative on file     Relevant past medical, surgical, family and social history reviewed and updated as indicated. Interim medical history since our last visit reviewed. Allergies and medications reviewed and updated.  Review of Systems  Per HPI unless specifically indicated above     Objective:    BP 111/68 (BP Location: Left Arm, Patient Position: Sitting, Cuff Size: Normal)   Pulse 76   Temp 97.8 F (36.6 C)   Ht 5' 1.3" (1.557 m)   Wt 132 lb 6.4 oz (60.1 kg)   LMP 11/28/1971 (Approximate)   SpO2 97%   BMI 24.77 kg/m   Wt Readings from Last 3 Encounters:  02/25/16 132 lb  6.4 oz (60.1 kg)  10/18/15 132 lb (59.9 kg)  09/20/15 131 lb (59.4 kg)    Physical Exam  Constitutional: She is oriented to person, place, and time. She appears well-developed and well-nourished.  HENT:  Head: Normocephalic and atraumatic.  Eyes: Pupils are equal, round, and reactive to light. Right eye exhibits no discharge. Left eye exhibits no discharge. No scleral icterus.  Neck: Normal range of motion. Neck supple. Carotid bruit is not present. No thyromegaly present.  Cardiovascular: Normal rate, regular rhythm and normal heart sounds.  Exam reveals no gallop and no friction rub.   No murmur heard. Pulmonary/Chest: Effort normal and breath sounds normal. No respiratory distress. She has no wheezes. She has no rales.  Abdominal: Soft.  Bowel sounds are normal. There is no tenderness. There is no rebound.  Genitourinary: No breast swelling, tenderness or discharge.  Musculoskeletal: Normal range of motion.  Lymphadenopathy:    She has no cervical adenopathy.  Neurological: She is alert and oriented to person, place, and time.  Skin: Skin is warm, dry and intact. No rash noted.  Psychiatric: She has a normal mood and affect. Her speech is normal and behavior is normal. Judgment and thought content normal. Cognition and memory are normal.    Results for orders placed or performed in visit on 10/18/15  CBC With Differential/Platelet  Result Value Ref Range   WBC 6.3 3.4 - 10.8 x10E3/uL   RBC 3.87 3.77 - 5.28 x10E6/uL   Hemoglobin 12.1 11.1 - 15.9 g/dL   Hematocrit 35.7 34.0 - 46.6 %   MCV 92 79 - 97 fL   MCH 31.3 26.6 - 33.0 pg   MCHC 33.9 31.5 - 35.7 g/dL   RDW 15.2 12.3 - 15.4 %   Platelets 222 150 - 379 x10E3/uL   Neutrophils 81 %   Lymphs 15 %   MID 4 %   Neutrophils Absolute 5.0 1.4 - 7.0 x10E3/uL   Lymphocytes Absolute 1.0 0.7 - 3.1 x10E3/uL   MID (Absolute) 0.3 0.1 - 1.6 X10E3/uL  TSH  Result Value Ref Range   TSH 0.462 0.450 - 4.500 uIU/mL  Comprehensive metabolic panel  Result Value Ref Range   Glucose 129 (H) 65 - 99 mg/dL   BUN 33 (H) 8 - 27 mg/dL   Creatinine, Ser 0.97 0.57 - 1.00 mg/dL   GFR calc non Af Amer 54 (L) >59 mL/min/1.73   GFR calc Af Amer 62 >59 mL/min/1.73   BUN/Creatinine Ratio 34 (H) 12 - 28   Sodium 140 134 - 144 mmol/L   Potassium 4.8 3.5 - 5.2 mmol/L   Chloride 102 96 - 106 mmol/L   CO2 23 18 - 29 mmol/L   Calcium 9.5 8.7 - 10.3 mg/dL   Total Protein 6.6 6.0 - 8.5 g/dL   Albumin 4.4 3.5 - 4.7 g/dL   Globulin, Total 2.2 1.5 - 4.5 g/dL   Albumin/Globulin Ratio 2.0 1.2 - 2.2   Bilirubin Total 0.3 0.0 - 1.2 mg/dL   Alkaline Phosphatase 45 39 - 117 IU/L   AST 19 0 - 40 IU/L   ALT 17 0 - 32 IU/L      Assessment & Plan:   Problem List Items Addressed This Visit       Unprioritized   Hyperlipidemia    Check lipid panel      Relevant Orders   Lipid Panel w/o Chol/HDL Ratio   Hypertension    Stable, continue present medications.  Relevant Orders   Comprehensive metabolic panel   Smoker    Encouraged to quit      Word finding difficulty    This is a concern.  No other signs of cognitive decline.  Will continue to monitor       Other Visit Diagnoses    Immunization due    -  Primary   Relevant Orders   Flu vaccine HIGH DOSE PF (Completed)   Annual physical exam       Chronic fatigue       Relevant Orders   TSH        Follow up plan: Return in about 6 months (around 08/24/2016).

## 2016-02-26 ENCOUNTER — Encounter: Payer: Self-pay | Admitting: Unknown Physician Specialty

## 2016-02-26 LAB — COMPREHENSIVE METABOLIC PANEL
A/G RATIO: 1.7 (ref 1.2–2.2)
ALK PHOS: 38 IU/L — AB (ref 39–117)
ALT: 11 IU/L (ref 0–32)
AST: 18 IU/L (ref 0–40)
Albumin: 4.3 g/dL (ref 3.5–4.7)
BUN/Creatinine Ratio: 26 (ref 12–28)
BUN: 25 mg/dL (ref 8–27)
Bilirubin Total: 0.3 mg/dL (ref 0.0–1.2)
CALCIUM: 9.6 mg/dL (ref 8.7–10.3)
CHLORIDE: 106 mmol/L (ref 96–106)
CO2: 22 mmol/L (ref 18–29)
Creatinine, Ser: 0.95 mg/dL (ref 0.57–1.00)
GFR calc Af Amer: 64 mL/min/{1.73_m2} (ref >59)
GFR, EST NON AFRICAN AMERICAN: 56 mL/min/{1.73_m2} — AB (ref >59)
GLOBULIN, TOTAL: 2.5 g/dL (ref 1.5–4.5)
Glucose: 101 mg/dL — ABNORMAL HIGH (ref 65–99)
POTASSIUM: 4.4 mmol/L (ref 3.5–5.2)
SODIUM: 144 mmol/L (ref 134–144)
Total Protein: 6.8 g/dL (ref 6.0–8.5)

## 2016-02-26 LAB — LIPID PANEL W/O CHOL/HDL RATIO
CHOLESTEROL TOTAL: 148 mg/dL (ref 100–199)
HDL: 67 mg/dL (ref >39)
LDL Calculated: 65 mg/dL (ref 0–99)
TRIGLYCERIDES: 80 mg/dL (ref 0–149)
VLDL Cholesterol Cal: 16 mg/dL (ref 5–40)

## 2016-02-26 LAB — TSH: TSH: 0.756 u[IU]/mL (ref 0.450–4.500)

## 2016-03-23 ENCOUNTER — Other Ambulatory Visit: Payer: Self-pay | Admitting: Unknown Physician Specialty

## 2016-03-28 NOTE — Telephone Encounter (Signed)
Your patient 

## 2016-04-17 ENCOUNTER — Ambulatory Visit (INDEPENDENT_AMBULATORY_CARE_PROVIDER_SITE_OTHER): Payer: Medicare Other | Admitting: Family Medicine

## 2016-04-17 ENCOUNTER — Encounter: Payer: Self-pay | Admitting: Family Medicine

## 2016-04-17 VITALS — BP 145/83 | HR 84 | Temp 98.4°F | Wt 133.0 lb

## 2016-04-17 DIAGNOSIS — M542 Cervicalgia: Secondary | ICD-10-CM

## 2016-04-17 DIAGNOSIS — R42 Dizziness and giddiness: Secondary | ICD-10-CM

## 2016-04-17 MED ORDER — MECLIZINE HCL 25 MG PO TABS: 25.0000 mg | ORAL_TABLET | Freq: Three times a day (TID) | ORAL | 1 refills | Status: DC | PRN

## 2016-04-17 MED ORDER — CYCLOBENZAPRINE HCL 5 MG PO TABS: 5.0000 mg | ORAL_TABLET | Freq: Three times a day (TID) | ORAL | 1 refills | Status: DC | PRN

## 2016-04-17 NOTE — Patient Instructions (Signed)
Take flexeril as needed for muscle cramps or neck pain - can make you a bit sleepy so no driving while taking it  Meclizine as needed for dizzy spells.

## 2016-04-17 NOTE — Progress Notes (Signed)
BP (!) 145/83   Pulse 84   Temp 98.4 F (36.9 C)   Wt 133 lb (60.3 kg)   LMP 11/28/1971 (Approximate)   SpO2 99%   BMI 24.88 kg/m    Subjective:    Patient ID: Karen Velasquez, female    DOB: 1930/10/01, 82 y.o.   MRN: [id number]  HPI: Karen Velasquez is a 82 y.o. female  Chief Complaint  Patient presents with  . Dizziness    worke up with morning early with dizziness, has improved but still feels off.   . Emesis    early Tues am with diarrhea and vomiting. Was better the next morning.  . Neck Pain    x weeks, worse lately, starts around her shoulder blade and moves up into her head  . Lab Result question    she drinks club soda for cramps, wonders if that could cause her kidney function being a little low   Patient presents today accompanied by her daughter who helps supplement the HPI. She has multiple concerns today.  Neck pain for several months now, worsening the last 2 weeks or so and is beginning to affect her quality of life. Has known arthritis and lumbar spinal stenosis, but never had an issues with her neck prior to this. States it's a dull pain extending b/l up neck and into her posterior scalp. Denies radiation down arms, weakness, numbness, or tingling of UEs. Has not been taking anything for the pain, just rubbing muscle creams on the area multiple times daily with some good relief from that.   Had a 24 hour stomach virus Tuesday, woke up with N/V/D that lasted the entire day. Was fine the next morning. Had crackers and ginger ale throughout the day. No persisting sxs.   Dizziness since 4 am this morning, states when she sat up in bed she was hit with a severe dizziness like the room was going in circles or she was on a boat. The sxs have somewhat lessened over the course of the morning but are still present. Denies N/V/syncope or falls related to the dizziness. Did not take her BP medicine Tuesday, but otherwise has been on track faithfully taking medicines. Has worked  hard to hydrate herself after her stomach virus and does not feel that she is dehydrated.   Wanted to discuss if it was safe to drink club soda for cramps and if that would hurt her kidney function since she was told that it was slightly low recently. States the soda does help relieve her cramps really well.   Past Medical History:  Diagnosis Date  . Hyperlipidemia   . Hypertension    Social History   Social History  . Marital status: Widowed    Spouse name: N/A  . Number of children: 1  . Years of education: N/A   Occupational History  . retired    Social History Main Topics  . Smoking status: Current Every Day Smoker    Packs/day: 0.50    Types: Cigarettes  . Smokeless tobacco: Never Used  . Alcohol use No  . Drug use: No  . Sexual activity: Not Currently   Other Topics Concern  . Not on file   Social History Narrative  . No narrative on file    Relevant past medical, surgical, family and social history reviewed and updated as indicated. Interim medical history since our last visit reviewed. Allergies and medications reviewed and updated.  Review of Systems  Constitutional: Negative.  HENT: Negative.   Eyes: Negative for visual disturbance.  Respiratory: Negative for shortness of breath.   Cardiovascular: Negative for chest pain and palpitations.  Gastrointestinal: Negative for nausea and vomiting.  Genitourinary: Negative.   Musculoskeletal: Positive for neck pain.  Neurological: Positive for dizziness. Negative for syncope, weakness, light-headedness and numbness.  Psychiatric/Behavioral: Negative.     Per HPI unless specifically indicated above     Objective:    BP (!) 145/83   Pulse 84   Temp 98.4 F (36.9 C)   Wt 133 lb (60.3 kg)   LMP 11/28/1971 (Approximate)   SpO2 99%   BMI 24.88 kg/m   Wt Readings from Last 3 Encounters:  04/17/16 133 lb (60.3 kg)  02/25/16 132 lb 6.4 oz (60.1 kg)  10/18/15 132 lb (59.9 kg)    Physical Exam    Constitutional: She is oriented to person, place, and time. She appears well-developed and well-nourished. No distress.  HENT:  Head: Atraumatic.  Eyes: Conjunctivae and EOM are normal. Pupils are equal, round, and reactive to light. No scleral icterus.  Neck: Normal range of motion. Neck supple. No thyromegaly present.  TTP over b/l trapezius muscles  Cardiovascular: Normal rate and normal heart sounds.   Pulmonary/Chest: Effort normal and breath sounds normal. No respiratory distress.  Musculoskeletal: Normal range of motion.  Lymphadenopathy:    She has no cervical adenopathy.  Neurological: She is alert and oriented to person, place, and time. No cranial nerve deficit.  Skin: Skin is warm and dry.  Psychiatric: She has a normal mood and affect. Her behavior is normal.  Nursing note and vitals reviewed.     Assessment & Plan:   Problem List Items Addressed This Visit    None    Visit Diagnoses    Neck pain    -  Primary   Flexeril sent, precautions discussed at length. Can take tylenol and muscle rub prn for pain relief.    Dizziness       Suspect vertigo, meclizine sent for prn use. Also recommended to continue faithful BP med use and stay very well hydrated.     used shared decision making regarding the neck pain - gave pt options of trying medication management, PT, or orthopedic referral/possible imaging. Pt very concerned given the worsening course and wanting orthopedic visit. Already established at [facility] and will call over there to get an appt right away.    Follow up plan: Return for as scheduled.

## 2016-06-12 DIAGNOSIS — H18603 Keratoconus, unspecified, bilateral: Secondary | ICD-10-CM | POA: Diagnosis not present

## 2016-06-16 DIAGNOSIS — H18603 Keratoconus, unspecified, bilateral: Secondary | ICD-10-CM | POA: Diagnosis not present

## 2016-07-27 ENCOUNTER — Other Ambulatory Visit: Payer: Self-pay | Admitting: Family Medicine

## 2016-07-31 DIAGNOSIS — H25012 Cortical age-related cataract, left eye: Secondary | ICD-10-CM | POA: Diagnosis not present

## 2016-08-04 DIAGNOSIS — S46819A Strain of other muscles, fascia and tendons at shoulder and upper arm level, unspecified arm, initial encounter: Secondary | ICD-10-CM | POA: Diagnosis not present

## 2016-08-04 DIAGNOSIS — M542 Cervicalgia: Secondary | ICD-10-CM | POA: Diagnosis not present

## 2016-08-25 ENCOUNTER — Encounter: Payer: Self-pay | Admitting: Unknown Physician Specialty

## 2016-08-25 ENCOUNTER — Ambulatory Visit (INDEPENDENT_AMBULATORY_CARE_PROVIDER_SITE_OTHER): Payer: Medicare Other | Admitting: Unknown Physician Specialty

## 2016-08-25 VITALS — BP 129/81 | HR 83 | Temp 97.5°F | Ht 62.2 in | Wt 139.2 lb

## 2016-08-25 DIAGNOSIS — E782 Mixed hyperlipidemia: Secondary | ICD-10-CM

## 2016-08-25 DIAGNOSIS — I10 Essential (primary) hypertension: Secondary | ICD-10-CM

## 2016-08-25 NOTE — Assessment & Plan Note (Addendum)
Check labs and adjust according to results

## 2016-08-25 NOTE — Assessment & Plan Note (Signed)
Stable, continue present medications.   

## 2016-08-25 NOTE — Progress Notes (Signed)
BP 129/81 (BP Location: Left Arm, Cuff Size: Small)   Pulse 83   Temp 97.5 F (36.4 C)   Ht 5' 2.2" (1.58 m)   Wt 139 lb 3.2 oz (63.1 kg)   LMP 11/28/1971 (Approximate)   SpO2 98%   BMI 25.30 kg/m    Subjective:    Patient ID: Karen Velasquez, female    DOB: 1931-05-22, 83 y.o.   MRN: [id number]  HPI: Karen Velasquez is a 83 y.o. female  Chief Complaint  Patient presents with  . Hyperlipidemia    pt states she is fasting today   . Hypertension   Hypertension Using medications without difficulty Average home BPs Not checking   No problems or lightheadedness No chest pain with exertion or shortness of breath No Edema   Hyperlipidemia Using medications without problems: No Muscle aches  Diet compliance/Exercise: Not doing anything other that what she has always done   Relevant past medical, surgical, family and social history reviewed and updated as indicated. Interim medical history since our last visit reviewed. Allergies and medications reviewed and updated.  Review of Systems  Per HPI unless specifically indicated above     Objective:    BP 129/81 (BP Location: Left Arm, Cuff Size: Small)   Pulse 83   Temp 97.5 F (36.4 C)   Ht 5' 2.2" (1.58 m)   Wt 139 lb 3.2 oz (63.1 kg)   LMP 11/28/1971 (Approximate)   SpO2 98%   BMI 25.30 kg/m   Wt Readings from Last 3 Encounters:  08/25/16 139 lb 3.2 oz (63.1 kg)  04/17/16 133 lb (60.3 kg)  02/25/16 132 lb 6.4 oz (60.1 kg)    Physical Exam  Constitutional: She is oriented to person, place, and time. She appears well-developed and well-nourished. No distress.  HENT:  Head: Normocephalic and atraumatic.  Eyes: Conjunctivae and lids are normal. Right eye exhibits no discharge. Left eye exhibits no discharge. No scleral icterus.  Neck: Normal range of motion. Neck supple. No JVD present. Carotid bruit is not present.  Cardiovascular: Normal rate, regular rhythm and normal heart sounds.   Pulmonary/Chest: Effort  normal and breath sounds normal.  Abdominal: Normal appearance. There is no splenomegaly or hepatomegaly.  Musculoskeletal: Normal range of motion.  Neurological: She is alert and oriented to person, place, and time.  Skin: Skin is warm, dry and intact. No rash noted. No pallor.  Psychiatric: She has a normal mood and affect. Her behavior is normal. Judgment and thought content normal.    Results for orders placed or performed in visit on 02/25/16  Comprehensive metabolic panel  Result Value Ref Range   Glucose 101 (H) 65 - 99 mg/dL   BUN 25 8 - 27 mg/dL   Creatinine, Ser 0.95 0.57 - 1.00 mg/dL   GFR calc non Af Amer 56 (L) >59 mL/min/1.73   GFR calc Af Amer 64 >59 mL/min/1.73   BUN/Creatinine Ratio 26 12 - 28   Sodium 144 134 - 144 mmol/L   Potassium 4.4 3.5 - 5.2 mmol/L   Chloride 106 96 - 106 mmol/L   CO2 22 18 - 29 mmol/L   Calcium 9.6 8.7 - 10.3 mg/dL   Total Protein 6.8 6.0 - 8.5 g/dL   Albumin 4.3 3.5 - 4.7 g/dL   Globulin, Total 2.5 1.5 - 4.5 g/dL   Albumin/Globulin Ratio 1.7 1.2 - 2.2   Bilirubin Total 0.3 0.0 - 1.2 mg/dL   Alkaline Phosphatase 38 (L) 39 - 117 IU/L  AST 18 0 - 40 IU/L   ALT 11 0 - 32 IU/L  Lipid Panel w/o Chol/HDL Ratio  Result Value Ref Range   Cholesterol, Total 148 100 - 199 mg/dL   Triglycerides 80 0 - 149 mg/dL   HDL 67 >39 mg/dL   VLDL Cholesterol Cal 16 5 - 40 mg/dL   LDL Calculated 65 0 - 99 mg/dL  TSH  Result Value Ref Range   TSH 0.756 0.450 - 4.500 uIU/mL      Assessment & Plan:   Problem List Items Addressed This Visit      Unprioritized   Hyperlipidemia    Stable, continue present medications.        Relevant Orders   Lipid Panel w/o Chol/HDL Ratio   Hypertension - Primary    Stable, continue present medications.        Relevant Orders   Comprehensive metabolic panel       Follow up plan: Return in about 6 months (around 02/25/2017) for physical.

## 2016-08-26 ENCOUNTER — Encounter: Payer: Self-pay | Admitting: Unknown Physician Specialty

## 2016-08-26 LAB — LIPID PANEL W/O CHOL/HDL RATIO
CHOLESTEROL TOTAL: 135 mg/dL (ref 100–199)
HDL: 60 mg/dL (ref >39)
LDL CALC: 64 mg/dL (ref 0–99)
Triglycerides: 57 mg/dL (ref 0–149)
VLDL Cholesterol Cal: 11 mg/dL (ref 5–40)

## 2016-08-26 LAB — COMPREHENSIVE METABOLIC PANEL
ALBUMIN: 4.2 g/dL (ref 3.5–4.7)
ALK PHOS: 36 IU/L — AB (ref 39–117)
ALT: 12 IU/L (ref 0–32)
AST: 17 IU/L (ref 0–40)
Albumin/Globulin Ratio: 1.9 (ref 1.2–2.2)
BUN / CREAT RATIO: 36 — AB (ref 12–28)
BUN: 35 mg/dL — ABNORMAL HIGH (ref 8–27)
Bilirubin Total: 0.3 mg/dL (ref 0.0–1.2)
CO2: 24 mmol/L (ref 18–29)
CREATININE: 0.98 mg/dL (ref 0.57–1.00)
Calcium: 9.2 mg/dL (ref 8.7–10.3)
Chloride: 107 mmol/L — ABNORMAL HIGH (ref 96–106)
GFR calc Af Amer: 62 mL/min/{1.73_m2} (ref >59)
GFR, EST NON AFRICAN AMERICAN: 54 mL/min/{1.73_m2} — AB (ref >59)
GLOBULIN, TOTAL: 2.2 g/dL (ref 1.5–4.5)
Glucose: 90 mg/dL (ref 65–99)
POTASSIUM: 4.9 mmol/L (ref 3.5–5.2)
SODIUM: 142 mmol/L (ref 134–144)
Total Protein: 6.4 g/dL (ref 6.0–8.5)

## 2016-08-26 NOTE — Progress Notes (Signed)
Normal labs.  Patient notified by letter.

## 2016-09-01 DIAGNOSIS — S46819D Strain of other muscles, fascia and tendons at shoulder and upper arm level, unspecified arm, subsequent encounter: Secondary | ICD-10-CM | POA: Diagnosis not present

## 2016-09-01 DIAGNOSIS — M542 Cervicalgia: Secondary | ICD-10-CM | POA: Diagnosis not present

## 2016-09-11 DIAGNOSIS — M530 Cervicocranial syndrome: Secondary | ICD-10-CM | POA: Diagnosis not present

## 2016-09-14 ENCOUNTER — Other Ambulatory Visit: Payer: Self-pay | Admitting: Unknown Physician Specialty

## 2016-09-15 DIAGNOSIS — M530 Cervicocranial syndrome: Secondary | ICD-10-CM | POA: Diagnosis not present

## 2016-09-19 ENCOUNTER — Other Ambulatory Visit: Payer: Self-pay | Admitting: Neurosurgery

## 2016-09-19 DIAGNOSIS — M5137 Other intervertebral disc degeneration, lumbosacral region: Secondary | ICD-10-CM

## 2016-09-19 DIAGNOSIS — M530 Cervicocranial syndrome: Secondary | ICD-10-CM | POA: Diagnosis not present

## 2016-09-22 DIAGNOSIS — M530 Cervicocranial syndrome: Secondary | ICD-10-CM | POA: Diagnosis not present

## 2016-09-25 ENCOUNTER — Ambulatory Visit
Admission: RE | Admit: 2016-09-25 | Discharge: 2016-09-25 | Disposition: A | Discharge status: Home / Self Care | Payer: Medicare Other | Source: Ambulatory Visit | Attending: Neurosurgery | Admitting: Neurosurgery

## 2016-09-25 DIAGNOSIS — M5137 Other intervertebral disc degeneration, lumbosacral region: Secondary | ICD-10-CM

## 2016-09-25 DIAGNOSIS — M47817 Spondylosis without myelopathy or radiculopathy, lumbosacral region: Secondary | ICD-10-CM | POA: Diagnosis not present

## 2016-09-25 MED ORDER — METHYLPREDNISOLONE ACETATE 40 MG/ML INJ SUSP (RADIOLOG
120.0000 mg | Freq: Once | INTRAMUSCULAR | Status: AC
  Administered 2016-09-25: 120 mg via EPIDURAL

## 2016-09-25 MED ORDER — IOPAMIDOL (ISOVUE-M 200) INJECTION 41%
1.0000 mL | Freq: Once | INTRAMUSCULAR | Status: AC
  Administered 2016-09-25: 1 mL via EPIDURAL

## 2016-09-25 NOTE — Discharge Instructions (Signed)

## 2016-09-26 DIAGNOSIS — M530 Cervicocranial syndrome: Secondary | ICD-10-CM | POA: Diagnosis not present

## 2016-09-29 DIAGNOSIS — M530 Cervicocranial syndrome: Secondary | ICD-10-CM | POA: Diagnosis not present

## 2016-10-03 DIAGNOSIS — M530 Cervicocranial syndrome: Secondary | ICD-10-CM | POA: Diagnosis not present

## 2016-10-06 DIAGNOSIS — M530 Cervicocranial syndrome: Secondary | ICD-10-CM | POA: Diagnosis not present

## 2016-10-10 DIAGNOSIS — M530 Cervicocranial syndrome: Secondary | ICD-10-CM | POA: Diagnosis not present

## 2016-10-12 ENCOUNTER — Other Ambulatory Visit: Payer: Self-pay | Admitting: Family Medicine

## 2016-10-13 DIAGNOSIS — M542 Cervicalgia: Secondary | ICD-10-CM | POA: Diagnosis not present

## 2016-10-13 NOTE — Telephone Encounter (Signed)
Your patient 

## 2016-10-16 ENCOUNTER — Other Ambulatory Visit: Payer: Self-pay | Admitting: Orthopedic Surgery

## 2016-10-16 DIAGNOSIS — M542 Cervicalgia: Secondary | ICD-10-CM

## 2016-10-23 ENCOUNTER — Ambulatory Visit: Payer: Medicare Other

## 2016-10-28 ENCOUNTER — Ambulatory Visit: Payer: Medicare Other

## 2016-10-29 ENCOUNTER — Ambulatory Visit
Admission: RE | Admit: 2016-10-29 | Discharge: 2016-10-29 | Disposition: A | Discharge status: Home / Self Care | Payer: Medicare Other | Source: Ambulatory Visit | Attending: Orthopedic Surgery | Admitting: Orthopedic Surgery

## 2016-10-29 DIAGNOSIS — M542 Cervicalgia: Secondary | ICD-10-CM | POA: Diagnosis present

## 2016-10-29 DIAGNOSIS — M1288 Other specific arthropathies, not elsewhere classified, other specified site: Secondary | ICD-10-CM | POA: Diagnosis not present

## 2016-10-31 DIAGNOSIS — M542 Cervicalgia: Secondary | ICD-10-CM | POA: Diagnosis not present

## 2016-11-20 DIAGNOSIS — M542 Cervicalgia: Secondary | ICD-10-CM | POA: Diagnosis not present

## 2016-11-20 DIAGNOSIS — M25551 Pain in right hip: Secondary | ICD-10-CM | POA: Diagnosis not present

## 2016-11-24 ENCOUNTER — Other Ambulatory Visit: Payer: Self-pay | Admitting: Orthopedic Surgery

## 2016-11-24 DIAGNOSIS — M542 Cervicalgia: Secondary | ICD-10-CM

## 2016-12-03 ENCOUNTER — Ambulatory Visit
Admission: RE | Admit: 2016-12-03 | Discharge: 2016-12-03 | Disposition: A | Discharge status: Home / Self Care | Payer: Medicare Other | Source: Ambulatory Visit | Attending: Orthopedic Surgery | Admitting: Orthopedic Surgery

## 2016-12-03 DIAGNOSIS — M47812 Spondylosis without myelopathy or radiculopathy, cervical region: Secondary | ICD-10-CM | POA: Diagnosis not present

## 2016-12-03 DIAGNOSIS — M4802 Spinal stenosis, cervical region: Secondary | ICD-10-CM | POA: Diagnosis not present

## 2016-12-03 DIAGNOSIS — M542 Cervicalgia: Secondary | ICD-10-CM | POA: Diagnosis present

## 2016-12-03 DIAGNOSIS — M4312 Spondylolisthesis, cervical region: Secondary | ICD-10-CM | POA: Insufficient documentation

## 2016-12-03 DIAGNOSIS — I6523 Occlusion and stenosis of bilateral carotid arteries: Secondary | ICD-10-CM | POA: Insufficient documentation

## 2016-12-03 DIAGNOSIS — M5030 Other cervical disc degeneration, unspecified cervical region: Secondary | ICD-10-CM | POA: Diagnosis not present

## 2016-12-16 ENCOUNTER — Other Ambulatory Visit: Payer: Self-pay | Admitting: Student

## 2016-12-16 DIAGNOSIS — M5416 Radiculopathy, lumbar region: Secondary | ICD-10-CM

## 2016-12-18 DIAGNOSIS — M542 Cervicalgia: Secondary | ICD-10-CM | POA: Diagnosis not present

## 2016-12-23 ENCOUNTER — Other Ambulatory Visit: Payer: Self-pay | Admitting: Student

## 2016-12-23 ENCOUNTER — Ambulatory Visit
Admission: RE | Admit: 2016-12-23 | Discharge: 2016-12-23 | Disposition: A | Discharge status: Home / Self Care | Payer: Medicare Other | Source: Ambulatory Visit | Attending: Student | Admitting: Student

## 2016-12-23 DIAGNOSIS — M5416 Radiculopathy, lumbar region: Secondary | ICD-10-CM

## 2016-12-23 DIAGNOSIS — M545 Low back pain: Secondary | ICD-10-CM | POA: Diagnosis not present

## 2016-12-23 MED ORDER — METHYLPREDNISOLONE ACETATE 40 MG/ML INJ SUSP (RADIOLOG
120.0000 mg | Freq: Once | INTRAMUSCULAR | Status: AC
  Administered 2016-12-23: 120 mg via EPIDURAL

## 2016-12-23 MED ORDER — IOPAMIDOL (ISOVUE-M 200) INJECTION 41%
1.0000 mL | Freq: Once | INTRAMUSCULAR | Status: AC
  Administered 2016-12-23: 1 mL via EPIDURAL

## 2017-01-01 DIAGNOSIS — M48061 Spinal stenosis, lumbar region without neurogenic claudication: Secondary | ICD-10-CM | POA: Diagnosis not present

## 2017-01-13 ENCOUNTER — Telehealth: Payer: Self-pay | Admitting: Unknown Physician Specialty

## 2017-01-13 NOTE — Telephone Encounter (Signed)
Called pt to schedule for Annual Wellness Visit with Nurse Health Advisor, Tiffany Hill, my c/b # is [phone]  Karen Velasquez ° °

## 2017-01-19 ENCOUNTER — Other Ambulatory Visit: Payer: Self-pay | Admitting: Unknown Physician Specialty

## 2017-01-29 DIAGNOSIS — M5030 Other cervical disc degeneration, unspecified cervical region: Secondary | ICD-10-CM | POA: Diagnosis not present

## 2017-02-05 ENCOUNTER — Ambulatory Visit: Payer: PRIVATE HEALTH INSURANCE

## 2017-02-24 ENCOUNTER — Ambulatory Visit (INDEPENDENT_AMBULATORY_CARE_PROVIDER_SITE_OTHER): Payer: Medicare Other | Admitting: Unknown Physician Specialty

## 2017-02-24 ENCOUNTER — Encounter: Payer: Self-pay | Admitting: Unknown Physician Specialty

## 2017-02-24 DIAGNOSIS — Z7189 Other specified counseling: Secondary | ICD-10-CM

## 2017-02-24 DIAGNOSIS — E782 Mixed hyperlipidemia: Secondary | ICD-10-CM | POA: Diagnosis not present

## 2017-02-24 DIAGNOSIS — Z8739 Personal history of other diseases of the musculoskeletal system and connective tissue: Secondary | ICD-10-CM

## 2017-02-24 DIAGNOSIS — J449 Chronic obstructive pulmonary disease, unspecified: Secondary | ICD-10-CM | POA: Diagnosis not present

## 2017-02-24 DIAGNOSIS — I10 Essential (primary) hypertension: Secondary | ICD-10-CM

## 2017-02-24 DIAGNOSIS — Z23 Encounter for immunization: Secondary | ICD-10-CM | POA: Diagnosis not present

## 2017-02-24 DIAGNOSIS — N951 Menopausal and female climacteric states: Secondary | ICD-10-CM | POA: Diagnosis not present

## 2017-02-24 DIAGNOSIS — R252 Cramp and spasm: Secondary | ICD-10-CM

## 2017-02-24 DIAGNOSIS — Z791 Long term (current) use of non-steroidal anti-inflammatories (NSAID): Secondary | ICD-10-CM

## 2017-02-24 DIAGNOSIS — Z0000 Encounter for general adult medical examination without abnormal findings: Secondary | ICD-10-CM

## 2017-02-24 MED ORDER — NAFTIFINE HCL 1 % EX CREA: 1.0000 "application " | TOPICAL_CREAM | CUTANEOUS | 12 refills | Status: DC | PRN

## 2017-02-24 NOTE — Assessment & Plan Note (Signed)
Stable, continue present medications.   

## 2017-02-24 NOTE — Assessment & Plan Note (Signed)
Doing well with NSAIDS,.  Check CMP

## 2017-02-24 NOTE — Patient Instructions (Addendum)
Influenza (Flu) Vaccine (Inactivated or Recombinant): What You Need to Know 1. Why get vaccinated? Influenza ("flu") is a contagious disease that spreads around the [place] every year, usually between October and May. Flu is caused by influenza viruses, and is spread mainly by coughing, sneezing, and close contact. Anyone can get flu. Flu strikes suddenly and can last several days. Symptoms vary by age, but can include:  fever/chills  sore throat  muscle aches  fatigue  cough  headache  runny or stuffy nose  Flu can also lead to pneumonia and blood infections, and cause diarrhea and seizures in children. If you have a medical condition, such as heart or lung disease, flu can make it worse. Flu is more dangerous for some people. Infants and young children, people 81 years of age and older, pregnant women, and people with certain health conditions or a weakened immune system are at greatest risk. Each year thousands of people in the [place] die from flu, and many more are hospitalized. Flu vaccine can:  keep you from getting flu,  make flu less severe if you do get it, and  keep you from spreading flu to your family and other people. 2. Inactivated and recombinant flu vaccines A dose of flu vaccine is recommended every flu season. Children 6 months through 91 years of age may need two doses during the same flu season. Everyone else needs only one dose each flu season. Some inactivated flu vaccines contain a very small amount of a mercury-based preservative called thimerosal. Studies have not shown thimerosal in vaccines to be harmful, but flu vaccines that do not contain thimerosal are available. There is no live flu virus in flu shots. They cannot cause the flu. There are many flu viruses, and they are always changing. Each year a new flu vaccine is made to protect against three or four viruses that are likely to cause disease in the upcoming flu season. But even when the  vaccine doesn't exactly match these viruses, it may still provide some protection. Flu vaccine cannot prevent:  flu that is caused by a virus not covered by the vaccine, or  illnesses that look like flu but are not.  It takes about 2 weeks for protection to develop after vaccination, and protection lasts through the flu season. 3. Some people should not get this vaccine Tell the person who is giving you the vaccine:  If you have any severe, life-threatening allergies. If you ever had a life-threatening allergic reaction after a dose of flu vaccine, or have a severe allergy to any part of this vaccine, you may be advised not to get vaccinated. Most, but not all, types of flu vaccine contain a small amount of egg protein.  If you ever had Guillain-Barr Syndrome (also called GBS). Some people with a history of GBS should not get this vaccine. This should be discussed with your doctor.  If you are not feeling well. It is usually okay to get flu vaccine when you have a mild illness, but you might be asked to come back when you feel better.  4. Risks of a vaccine reaction With any medicine, including vaccines, there is a chance of reactions. These are usually mild and go away on their own, but serious reactions are also possible. Most people who get a flu shot do not have any problems with it. Minor problems following a flu shot include:  soreness, redness, or swelling where the shot was given  hoarseness  sore,  red or itchy eyes  cough  fever  aches  headache  itching  fatigue  If these problems occur, they usually begin soon after the shot and last 1 or 2 days. More serious problems following a flu shot can include the following:  There may be a small increased risk of Guillain-Barre Syndrome (GBS) after inactivated flu vaccine. This risk has been estimated at 1 or 2 additional cases per million people vaccinated. This is much lower than the risk of severe complications from  flu, which can be prevented by flu vaccine.  Young children who get the flu shot along with pneumococcal vaccine (PCV13) and/or DTaP vaccine at the same time might be slightly more likely to have a seizure caused by fever. Ask your doctor for more information. Tell your doctor if a child who is getting flu vaccine has ever had a seizure.  Problems that could happen after any injected vaccine:  People sometimes faint after a medical procedure, including vaccination. Sitting or lying down for about 15 minutes can help prevent fainting, and injuries caused by a fall. Tell your doctor if you feel dizzy, or have vision changes or ringing in the ears.  Some people get severe pain in the shoulder and have difficulty moving the arm where a shot was given. This happens very rarely.  Any medication can cause a severe allergic reaction. Such reactions from a vaccine are very rare, estimated at about 1 in a million doses, and would happen within a few minutes to a few hours after the vaccination. As with any medicine, there is a very remote chance of a vaccine causing a serious injury or death. The safety of vaccines is always being monitored. For more information, visit: http://www.aguilar.org/ 5. What if there is a serious reaction? What should I look for? Look for anything that concerns you, such as signs of a severe allergic reaction, very high fever, or unusual behavior. Signs of a severe allergic reaction can include hives, swelling of the face and throat, difficulty breathing, a fast heartbeat, dizziness, and weakness. These would start a few minutes to a few hours after the vaccination. What should I do?  If you think it is a severe allergic reaction or other emergency that can't wait, call 9-1-1 and get the person to the nearest hospital. Otherwise, call your doctor.  Reactions should be reported to the Vaccine Adverse Event Reporting System (VAERS). Your doctor should file this report, or you  can do it yourself through the VAERS web site at www.vaers.SamedayNews.es, or by calling 6094730752. ? VAERS does not give medical advice. 6. The National Vaccine Injury Compensation Program The [employer] Vaccine Injury Compensation Program (VICP) is a federal program that was created to compensate people who may have been injured by certain vaccines. Persons who believe they may have been injured by a vaccine can learn about the program and about filing a claim by calling 458-267-6070 or visiting the [place] website at GoldCloset.com.ee. There is a time limit to file a claim for compensation. 7. How can I learn more?  Ask your healthcare provider. He or she can give you the vaccine package insert or suggest other sources of information.  Call your local or state health department.  Contact the Centers for Disease Control and Prevention (CDC): ? Call (540)164-9661 (1-800-CDC-INFO) or ? Visit CDC's website at https://gibson.com/ Vaccine Information Statement, Inactivated Influenza Vaccine (01/13/2014) This information is not intended to replace advice given to you by your health care provider. Make sure  you discuss any questions you have with your health care provider. -------------------------------------------------------------- Magnesium supplements for leg cramps.  Over the counter.   I prefer Magnesium Citrate over Magnesium Oxide.

## 2017-02-24 NOTE — Assessment & Plan Note (Signed)
Discussed menopausal symptoms and risks and benefits of Estrace

## 2017-02-24 NOTE — Assessment & Plan Note (Signed)
Inhaler prn

## 2017-02-24 NOTE — Assessment & Plan Note (Signed)
A voluntary discussion about advance care planning including the explanation and discussion of advance directives was extensively discussed  with the patient.  Explanation about the health care proxy and Living will was reviewed and packet with forms with explanation of how to fill them out was given.  During this discussion, the patient was able to identify a health care proxy as her daughter Karen Velasquez and has done the paperwork required.

## 2017-02-24 NOTE — Progress Notes (Signed)
BP 125/76   Pulse 89   Temp 98.5 F (36.9 C)   Ht 5\' 1"  (1.549 m)   Wt 137 lb (62.1 kg)   LMP 11/28/1971 (Approximate)   SpO2 96%   BMI 25.89 kg/m    Subjective:    Patient ID: Karen Velasquez, female    DOB: 25-Dec-1930, 83 y.o.   MRN: [id number]  HPI: Karen Velasquez is a 83 y.o. female  Chief Complaint  Patient presents with  . Medicare Wellness   Hyperlipidemia Using medications without problems: No Muscle aches  Diet compliance: Exercise:  Hyperlipidemia Using Fenofibrate without problems No Muscle aches  Diet compliance: Exercise:  Menopause Taking Estrace .5 mg.  States she might try off of it for a period of time  Neck/back pain Pt is following with neurosurgery.  Taking Tylenol or Motrin for pain.  Taking it for 2 months and pain is just about gone. Reviewed scans showing DDD.    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 No No No No   Depression screen [facility] 2/9 02/24/2017 02/25/2016 12/29/2014 11/22/2014  Decreased Interest 0 0 0 (No Data)  Down, Depressed, Hopeless 0 0 0 0  PHQ - 2 Score 0 0 0 0  Altered sleeping 0 - - -  Tired, decreased energy 1 - - -  Change in appetite 0 - - -  Feeling bad or failure about yourself  0 - - -  Trouble concentrating 0 - - -  Moving slowly or fidgety/restless 0 - - -  Suicidal thoughts 0 - - -  PHQ-9 Score 1 - - -     Family History  Problem  Relation Age of Onset  . Diabetes Father   . Cancer Father   . Cancer Sister    Social History   Social History  . Marital status: Widowed    Spouse name: N/A  . Number of children: 1  . Years of education: N/A   Occupational History  . retired    Social History Main Topics  . Smoking status: Former Smoker    Packs/day: 0.50    Types: Cigarettes    Quit date: 08/25/2016  . Smokeless tobacco: Never Used  . Alcohol use No  . Drug use: No  . Sexual activity: Not Currently   Other Topics Concern  . None   Social History Narrative  . None   Past Medical History:  Diagnosis Date  . Hyperlipidemia   . Hypertension    Past Surgical History:  Procedure Laterality Date  . ABDOMINAL HYSTERECTOMY    . APPENDECTOMY     2105  . cornea implant     3,4 yrs ago  . PARTIAL HYSTERECTOMY     62yrs of age  . ROTATOR CUFF REPAIR     39yr ago  . TONSILLECTOMY     as a child  . X-STOP IMPLANTATION N/A    55yrs ago    Relevant past medical, surgical, family and  social history reviewed and updated as indicated. Interim medical history since our last visit reviewed. Allergies and medications reviewed and updated.  Review of Systems  Constitutional: Negative.   HENT: Positive for rhinorrhea.        "drainage 24/7  Eyes: Negative.   Genitourinary: Negative.   Musculoskeletal:       Having night time cramps  Skin:       Wants a refill of Naftin for fungus  Neurological: Negative.   Psychiatric/Behavioral: Negative.     Per HPI unless specifically indicated above     Objective:    BP 125/76   Pulse 89   Temp 98.5 F (36.9 C)   Ht 5\' 1"  (1.549 m)   Wt 137 lb (62.1 kg)   LMP 11/28/1971 (Approximate)   SpO2 96%   BMI 25.89 kg/m   Wt Readings from Last 3 Encounters:  02/24/17 137 lb (62.1 kg)  08/25/16 139 lb 3.2 oz (63.1 kg)  04/17/16 133 lb (60.3 kg)    Physical Exam  Constitutional: She is oriented to person, place, and time. She appears well-developed and  well-nourished. No distress.  HENT:  Head: Normocephalic and atraumatic.  Eyes: Conjunctivae and lids are normal. Right eye exhibits no discharge. Left eye exhibits no discharge. No scleral icterus.  Neck: Normal range of motion. Neck supple. No JVD present. Carotid bruit is not present.  Cardiovascular: Normal rate, regular rhythm and normal heart sounds.   Pulmonary/Chest: Effort normal and breath sounds normal.  Abdominal: Soft. Normal appearance. There is no splenomegaly or hepatomegaly.  Musculoskeletal: Normal range of motion.  Neurological: She is alert and oriented to person, place, and time.  Skin: Skin is warm, dry and intact. No rash noted. No pallor.  Psychiatric: She has a normal mood and affect. Her behavior is normal. Judgment and thought content normal.    Results for orders placed or performed in visit on 08/25/16  Comprehensive metabolic panel  Result Value Ref Range   Glucose 90 65 - 99 mg/dL   BUN 35 (H) 8 - 27 mg/dL   Creatinine, Ser 0.98 0.57 - 1.00 mg/dL   GFR calc non Af Amer 54 (L) >59 mL/min/1.73   GFR calc Af Amer 62 >59 mL/min/1.73   BUN/Creatinine Ratio 36 (H) 12 - 28   Sodium 142 134 - 144 mmol/L   Potassium 4.9 3.5 - 5.2 mmol/L   Chloride 107 (H) 96 - 106 mmol/L   CO2 24 18 - 29 mmol/L   Calcium 9.2 8.7 - 10.3 mg/dL   Total Protein 6.4 6.0 - 8.5 g/dL   Albumin 4.2 3.5 - 4.7 g/dL   Globulin, Total 2.2 1.5 - 4.5 g/dL   Albumin/Globulin Ratio 1.9 1.2 - 2.2   Bilirubin Total 0.3 0.0 - 1.2 mg/dL   Alkaline Phosphatase 36 (L) 39 - 117 IU/L   AST 17 0 - 40 IU/L   ALT 12 0 - 32 IU/L  Lipid Panel w/o Chol/HDL Ratio  Result Value Ref Range   Cholesterol, Total 135 100 - 199 mg/dL   Triglycerides 57 0 - 149 mg/dL   HDL 60 >39 mg/dL   VLDL Cholesterol Cal 11 5 - 40 mg/dL   LDL Calculated 64 0 - 99 mg/dL      Assessment & Plan:   Problem List Items Addressed This Visit      Unprioritized   Advanced care planning/counseling discussion    A voluntary  discussion about advance care planning including the explanation and  discussion of advance directives was extensively discussed  with the patient.  Explanation about the health care proxy and Living will was reviewed and packet with forms with explanation of how to fill them out was given.  During this discussion, the patient was able to identify a health care proxy as her daughter Karen Velasquez and has done the paperwork required.       Bilateral leg cramps    Discussed Magnesium supplementation.  Written instructions given      COPD (chronic obstructive pulmonary disease) (HCC)    Inhaler prn      Hot flashes, menopausal    Discussed menopausal symptoms and risks and benefits of Estrace      Hx of spinal stenosis    Doing well with NSAIDS,.  Check CMP      Hyperlipidemia    On fenofibrate.  Check cholesterol today      Hypertension    Stable, continue present medications.        Relevant Orders   Comprehensive metabolic panel    Other Visit Diagnoses    Need for influenza vaccination       Relevant Orders   Flu vaccine HIGH DOSE PF (Completed)   Annual physical exam       Relevant Orders   Lipid Panel w/o Chol/HDL Ratio   NSAID long-term use       Relevant Orders   CBC with Differential/Platelet       Follow up plan: Return in about 6 months (around 08/24/2017).

## 2017-02-24 NOTE — Assessment & Plan Note (Signed)
On fenofibrate.  Check cholesterol today

## 2017-02-24 NOTE — Assessment & Plan Note (Signed)
Discussed Magnesium supplementation.  Written instructions given

## 2017-02-25 ENCOUNTER — Encounter: Payer: PRIVATE HEALTH INSURANCE | Admitting: Unknown Physician Specialty

## 2017-02-25 ENCOUNTER — Encounter: Payer: Self-pay | Admitting: Unknown Physician Specialty

## 2017-02-25 LAB — COMPREHENSIVE METABOLIC PANEL
A/G RATIO: 2 (ref 1.2–2.2)
ALBUMIN: 4.3 g/dL (ref 3.5–4.7)
ALT: 14 IU/L (ref 0–32)
AST: 15 IU/L (ref 0–40)
Alkaline Phosphatase: 35 IU/L — ABNORMAL LOW (ref 39–117)
BILIRUBIN TOTAL: 0.3 mg/dL (ref 0.0–1.2)
BUN / CREAT RATIO: 28 (ref 12–28)
BUN: 29 mg/dL — ABNORMAL HIGH (ref 8–27)
CHLORIDE: 107 mmol/L — AB (ref 96–106)
CO2: 22 mmol/L (ref 20–29)
Calcium: 9.3 mg/dL (ref 8.7–10.3)
Creatinine, Ser: 1.03 mg/dL — ABNORMAL HIGH (ref 0.57–1.00)
GFR calc non Af Amer: 50 mL/min/{1.73_m2} — ABNORMAL LOW (ref >59)
GFR, EST AFRICAN AMERICAN: 58 mL/min/{1.73_m2} — AB (ref >59)
GLOBULIN, TOTAL: 2.2 g/dL (ref 1.5–4.5)
Glucose: 98 mg/dL (ref 65–99)
POTASSIUM: 4.5 mmol/L (ref 3.5–5.2)
SODIUM: 143 mmol/L (ref 134–144)
TOTAL PROTEIN: 6.5 g/dL (ref 6.0–8.5)

## 2017-02-25 LAB — CBC WITH DIFFERENTIAL/PLATELET
BASOS: 1 %
Basophils Absolute: 0 10*3/uL (ref 0.0–0.2)
EOS (ABSOLUTE): 0.1 10*3/uL (ref 0.0–0.4)
EOS: 2 %
HEMATOCRIT: 35 % (ref 34.0–46.6)
Hemoglobin: 11.4 g/dL (ref 11.1–15.9)
IMMATURE GRANS (ABS): 0 10*3/uL (ref 0.0–0.1)
IMMATURE GRANULOCYTES: 0 %
LYMPHS: 30 %
Lymphocytes Absolute: 1.6 10*3/uL (ref 0.7–3.1)
MCH: 29.7 pg (ref 26.6–33.0)
MCHC: 32.6 g/dL (ref 31.5–35.7)
MCV: 91 fL (ref 79–97)
Monocytes Absolute: 0.4 10*3/uL (ref 0.1–0.9)
Monocytes: 8 %
NEUTROS PCT: 59 %
Neutrophils Absolute: 3.2 10*3/uL (ref 1.4–7.0)
PLATELETS: 214 10*3/uL (ref 150–379)
RBC: 3.84 x10E6/uL (ref 3.77–5.28)
RDW: 15.6 % — ABNORMAL HIGH (ref 12.3–15.4)
WBC: 5.3 10*3/uL (ref 3.4–10.8)

## 2017-02-25 LAB — LIPID PANEL W/O CHOL/HDL RATIO
Cholesterol, Total: 154 mg/dL (ref 100–199)
HDL: 60 mg/dL (ref >39)
LDL Calculated: 73 mg/dL (ref 0–99)
Triglycerides: 107 mg/dL (ref 0–149)
VLDL Cholesterol Cal: 21 mg/dL (ref 5–40)

## 2017-03-11 ENCOUNTER — Telehealth: Payer: Self-pay | Admitting: Unknown Physician Specialty

## 2017-03-11 NOTE — Telephone Encounter (Signed)
Routing to provider to advise patient concerns.

## 2017-03-11 NOTE — Telephone Encounter (Signed)
Called and left patient a VM asking for her to please return our call.

## 2017-03-11 NOTE — Telephone Encounter (Signed)
Left message to call back  

## 2017-03-11 NOTE — Telephone Encounter (Signed)
Patient was informed her kidney function was a little low in letter, was told ibuprofen and naproxen are not good for kidneys.   Patient is unsure if medication "advile and aleve".  Wanting to know if those are the two medications that are suppose to be used and if she can take them with her tylenol.   Patient was unsure if she was going to get a prescription for her drainage.    Patient unsure if she is suppose to have an appointment every 3 months or 6 months.  Patient unsure about what "safer choices" means and would like to understand options for safer choices besides tylenol.   Please Advise.  Thank you

## 2017-03-12 ENCOUNTER — Other Ambulatory Visit: Payer: Self-pay | Admitting: Unknown Physician Specialty

## 2017-03-12 MED ORDER — FLUTICASONE PROPIONATE 50 MCG/ACT NA SUSP: 2.0000 | Freq: Every day | NASAL | 6 refills | Status: DC

## 2017-03-12 NOTE — Telephone Encounter (Signed)
Discussed with pt that she will try to avoid Ibuprofen.

## 2017-03-12 NOTE — Telephone Encounter (Signed)
Call transferred to provider.

## 2017-03-14 ENCOUNTER — Other Ambulatory Visit: Payer: Self-pay | Admitting: Unknown Physician Specialty

## 2017-05-16 ENCOUNTER — Other Ambulatory Visit: Payer: Self-pay | Admitting: Unknown Physician Specialty

## 2017-07-06 ENCOUNTER — Other Ambulatory Visit: Payer: Self-pay | Admitting: Unknown Physician Specialty

## 2017-08-06 DIAGNOSIS — H25012 Cortical age-related cataract, left eye: Secondary | ICD-10-CM | POA: Diagnosis not present

## 2017-08-12 ENCOUNTER — Other Ambulatory Visit: Payer: Self-pay | Admitting: Unknown Physician Specialty

## 2017-08-18 ENCOUNTER — Ambulatory Visit (INDEPENDENT_AMBULATORY_CARE_PROVIDER_SITE_OTHER): Payer: Medicare Other | Admitting: Family Medicine

## 2017-08-18 ENCOUNTER — Encounter: Payer: Self-pay | Admitting: Family Medicine

## 2017-08-18 VITALS — BP 109/67 | HR 113 | Temp 98.8°F | Wt 141.2 lb

## 2017-08-18 DIAGNOSIS — J441 Chronic obstructive pulmonary disease with (acute) exacerbation: Secondary | ICD-10-CM

## 2017-08-18 DIAGNOSIS — J309 Allergic rhinitis, unspecified: Secondary | ICD-10-CM

## 2017-08-18 DIAGNOSIS — I10 Essential (primary) hypertension: Secondary | ICD-10-CM | POA: Diagnosis not present

## 2017-08-18 MED ORDER — BENZONATATE 200 MG PO CAPS: 200.0000 mg | ORAL_CAPSULE | Freq: Two times a day (BID) | ORAL | 0 refills | Status: DC | PRN

## 2017-08-18 MED ORDER — FLUTICASONE PROPIONATE 50 MCG/ACT NA SUSP: 2.0000 | Freq: Two times a day (BID) | NASAL | 6 refills | Status: DC

## 2017-08-18 MED ORDER — AMOXICILLIN-POT CLAVULANATE 875-125 MG PO TABS: 1.0000 | ORAL_TABLET | Freq: Two times a day (BID) | ORAL | 0 refills | Status: DC

## 2017-08-18 MED ORDER — LISINOPRIL-HYDROCHLOROTHIAZIDE 10-12.5 MG PO TABS: 1.0000 | ORAL_TABLET | Freq: Every day | ORAL | 1 refills | Status: DC

## 2017-08-18 MED ORDER — ALBUTEROL SULFATE (2.5 MG/3ML) 0.083% IN NEBU
2.5000 mg | INHALATION_SOLUTION | Freq: Once | RESPIRATORY_TRACT | Status: AC
  Administered 2017-08-18: 2.5 mg via RESPIRATORY_TRACT

## 2017-08-18 MED ORDER — HYDROCOD POLST-CPM POLST ER 10-8 MG/5ML PO SUER: 5.0000 mL | Freq: Two times a day (BID) | ORAL | 0 refills | Status: DC | PRN

## 2017-08-18 MED ORDER — CETIRIZINE HCL 10 MG PO TABS: 10.0000 mg | ORAL_TABLET | Freq: Every day | ORAL | 11 refills | Status: DC

## 2017-08-18 NOTE — Progress Notes (Addendum)
BP 109/67 (BP Location: Left Arm, Patient Position: Sitting, Cuff Size: Normal)   Pulse (!) 113   Temp 98.8 F (37.1 C) (Oral)   Wt 141 lb 3.2 oz (64 kg)   LMP 11/28/1971 (Approximate)   SpO2 96%   BMI 26.68 kg/m    Subjective:    Patient ID: Karen Velasquez, female    DOB: [birth year], 84 y.o.   MRN: [id number]  HPI: Karen Velasquez is a 84 y.o. female  Chief Complaint  Patient presents with  . Cough    x's 1 week. Productive.  . Shortness of Breath  . Medication Refill    Needs a refill on her Lisinopril and would like it sent to [facility], NOT [facility].   Chronic dry cough for months, but now worsening URI sxs with productive cough, chest tightness, thick nasal congestion x 1 week. Starting to have some SOB with it. Denies fever, chills, body aches. Hx of COPD, not currently on any inhalers. Former smoker, quit last year.  Needing refill of her BP medication. No side effects with it, BPs under good control. Denies CP, dizziness, HAs.   Past Medical History:  Diagnosis Date  . Hyperlipidemia   . Hypertension    Social History   Socioeconomic History  . Marital status: Widowed    Spouse name: Not on file  . Number of children: 1  . Years of education: Not on file  . Highest education level: Not on file  Social Needs  . Financial resource strain: Not on file  . Food insecurity - worry: Not on file  . Food insecurity - inability: Not on file  . Transportation needs - medical: Not on file  . Transportation needs - non-medical: Not on file  Occupational History  . Occupation: retired  Tobacco Use  . Smoking status: Former Smoker    Packs/day: 0.50    Types: Cigarettes    Last attempt to quit: 08/25/2016    Years since quitting: 1.0  . Smokeless tobacco: Never Used  Substance and Sexual Activity  . Alcohol use: No    Alcohol/week: 0.0 oz  . Drug use: No  . Sexual activity: Not Currently  Other Topics Concern  . Not on file  Social History Narrative  . Not  on file    Relevant past medical, surgical, family and social history reviewed and updated as indicated. Interim medical history since our last visit reviewed. Allergies and medications reviewed and updated.  Review of Systems  Per HPI unless specifically indicated above     Objective:    BP 109/67 (BP Location: Left Arm, Patient Position: Sitting, Cuff Size: Normal)   Pulse (!) 113   Temp 98.8 F (37.1 C) (Oral)   Wt 141 lb 3.2 oz (64 kg)   LMP 11/28/1971 (Approximate)   SpO2 96%   BMI 26.68 kg/m   Wt Readings from Last 3 Encounters:  08/18/17 141 lb 3.2 oz (64 kg)  02/24/17 137 lb (62.1 kg)  08/25/16 139 lb 3.2 oz (63.1 kg)    Physical Exam  Constitutional: She is oriented to person, place, and time. She appears well-developed and well-nourished. No distress.  HENT:  Head: Atraumatic.  Right Ear: External ear normal.  Left Ear: External ear normal.  Mouth/Throat: No oropharyngeal exudate.  Oropharynx and nasal mucosa erythematous and edematous with thick drainage  Eyes: Conjunctivae are normal. Pupils are equal, round, and reactive to light.  Neck: Normal range of motion. Neck supple.  Cardiovascular: Normal rate  and normal heart sounds.  Pulmonary/Chest: Effort normal. No respiratory distress. She has wheezes. She has no rales.  Musculoskeletal: Normal range of motion.  Lymphadenopathy:    She has no cervical adenopathy.  Neurological: She is alert and oriented to person, place, and time.  Skin: Skin is warm and dry.  Psychiatric: She has a normal mood and affect. Her behavior is normal.  Nursing note and vitals reviewed.  Results for orders placed or performed in visit on 02/24/17  Comprehensive metabolic panel  Result Value Ref Range   Glucose 98 65 - 99 mg/dL   BUN 29 (H) 8 - 27 mg/dL   Creatinine, Ser 1.03 (H) 0.57 - 1.00 mg/dL   GFR calc non Af Amer 50 (L) >59 mL/min/1.73   GFR calc Af Amer 58 (L) >59 mL/min/1.73   BUN/Creatinine Ratio 28 12 - 28    Sodium 143 134 - 144 mmol/L   Potassium 4.5 3.5 - 5.2 mmol/L   Chloride 107 (H) 96 - 106 mmol/L   CO2 22 20 - 29 mmol/L   Calcium 9.3 8.7 - 10.3 mg/dL   Total Protein 6.5 6.0 - 8.5 g/dL   Albumin 4.3 3.5 - 4.7 g/dL   Globulin, Total 2.2 1.5 - 4.5 g/dL   Albumin/Globulin Ratio 2.0 1.2 - 2.2   Bilirubin Total 0.3 0.0 - 1.2 mg/dL   Alkaline Phosphatase 35 (L) 39 - 117 IU/L   AST 15 0 - 40 IU/L   ALT 14 0 - 32 IU/L  Lipid Panel w/o Chol/HDL Ratio  Result Value Ref Range   Cholesterol, Total 154 100 - 199 mg/dL   Triglycerides 107 0 - 149 mg/dL   HDL 60 >39 mg/dL   VLDL Cholesterol Cal 21 5 - 40 mg/dL   LDL Calculated 73 0 - 99 mg/dL  CBC with Differential/Platelet  Result Value Ref Range   WBC 5.3 3.4 - 10.8 x10E3/uL   RBC 3.84 3.77 - 5.28 x10E6/uL   Hemoglobin 11.4 11.1 - 15.9 g/dL   Hematocrit 35.0 34.0 - 46.6 %   MCV 91 79 - 97 fL   MCH 29.7 26.6 - 33.0 pg   MCHC 32.6 31.5 - 35.7 g/dL   RDW 15.6 (H) 12.3 - 15.4 %   Platelets 214 150 - 379 x10E3/uL   Neutrophils 59 Not Estab. %   Lymphs 30 Not Estab. %   Monocytes 8 Not Estab. %   Eos 2 Not Estab. %   Basos 1 Not Estab. %   Neutrophils Absolute 3.2 1.4 - 7.0 x10E3/uL   Lymphocytes Absolute 1.6 0.7 - 3.1 x10E3/uL   Monocytes Absolute 0.4 0.1 - 0.9 x10E3/uL   EOS (ABSOLUTE) 0.1 0.0 - 0.4 x10E3/uL   Basophils Absolute 0.0 0.0 - 0.2 x10E3/uL   Immature Granulocytes 0 Not Estab. %   Immature Grans (Abs) 0.0 0.0 - 0.1 x10E3/uL      Assessment & Plan:   Problem List Items Addressed This Visit      Cardiovascular and Mediastinum   Hypertension    Stable and WNL, medication refilled today. Continue current regimen      Relevant Medications   lisinopril-hydrochlorothiazide (PRINZIDE,ZESTORETIC) 10-12.5 MG tablet     Respiratory   COPD (chronic obstructive pulmonary disease) ([facility]) - Primary    Will tx with tessalon, tussionex, and plain mucinex. If no improvement over the next few days can start zpack. Start advair BID.  Instructions given with that. Supportive care and sedation precautions reviewed at length  Relevant Medications   albuterol (PROVENTIL) (2.5 MG/3ML) 0.083% nebulizer solution 2.5 mg (Completed)   cetirizine (ZYRTEC) 10 MG tablet   fluticasone (FLONASE) 50 MCG/ACT nasal spray   benzonatate (TESSALON) 200 MG capsule   chlorpheniramine-HYDROcodone (TUSSIONEX PENNKINETIC ER) 10-8 MG/5ML SUER   Allergic rhinitis    Discussed importance of good allergy regimen to keep chronic cough and rhinorrhea at bay. Zyrtec and flonase sent, sinus rinses, humidifier          Follow up plan: Return if symptoms worsen or fail to improve.

## 2017-08-18 NOTE — Patient Instructions (Addendum)
Coricidin HBP cold products next time you have any sort of cold.   For the drainage: take zyrtec once daily and use the flonase nasal spray twice daily (point up and out toward corners of eyes)  For the upper respiratory infection: take the full course of augmentin (1 tablet twice daily for a week) and use the tessalon perles up to three times daily as needed and the tussionex syrup nightly as needed for cough.  For the chronic cough: take the advair inhaler twice daily every single day. Wash mouth out with water well after each use.

## 2017-08-19 ENCOUNTER — Telehealth: Payer: Self-pay | Admitting: Unknown Physician Specialty

## 2017-08-19 ENCOUNTER — Ambulatory Visit: Payer: Self-pay | Admitting: *Deleted

## 2017-08-19 MED ORDER — ONDANSETRON HCL 4 MG PO TABS: 4.0000 mg | ORAL_TABLET | Freq: Three times a day (TID) | ORAL | 0 refills | Status: DC | PRN

## 2017-08-19 NOTE — Telephone Encounter (Signed)
Pt's daughter called to report pt vomiting all night after taking all her Rx meds. From yesterday. Emesis this am x 1; pt's daughter gave her a phenergan she had from previous Rx. States pt says she vomited that up, (daughter did not see)  Daughter gave her another.  Not sure of dosage.  Daughter is not with pt, instructed to go to pt's home. Instructed may need to go to ED/UC.  Daughter requesting appt.  Daughter will call back once with pt.

## 2017-08-19 NOTE — Telephone Encounter (Signed)
Patient's daughter notified.

## 2017-08-19 NOTE — Telephone Encounter (Signed)
Zofran sent to [facility] [place], she should take her meds with a good solid meal to help avoid nausea and space them out as she is able to. Call if continuing to have N/V

## 2017-08-19 NOTE — Addendum Note (Signed)
Addended by: Merrie Roof E on: 08/19/2017 03:17 PM   Modules accepted: Orders

## 2017-08-19 NOTE — Telephone Encounter (Signed)
Pt's daughter returned call to report has not received Zofran Rx as of yet.  Also questioning if pt should continue all of the prescribed medications from yesterdays encounter. Pt has not taken any meds today, no further emesis.  Note: Pt did take all 4 medications TOGETHER yesterday prior to vomiting episodes: Tessalon,Zyrtec, Hydrocodone(5cc) and Augmentin.

## 2017-08-19 NOTE — Telephone Encounter (Signed)
Routing to provider who saw the patient.  

## 2017-08-19 NOTE — Telephone Encounter (Signed)
Additional note:  Spoke with [place] at practice. Daughter en-route to pt's home for further assessment. Called daughter to advise her of PCP response; Zofran called in to Rx.  Instructed daughter to call back if pt appears dehydrated, dizziness, increased somnolence, confusion,decreased urine output, vomiting continues. Care advise given per protocol. NT will plan call back once daughter with pt. Reason for Disposition . [1] SEVERE vomiting (e.g., 6 or more times/day) AND [2] present > 8 hours  Answer Assessment - Initial Assessment Questions 1. VOMITING SEVERITY: "How many times have you vomited in the past 24 hours?"     - MILD:  1 - 2 times/day    - MODERATE: 3 - 5 times/day, decreased oral intake without significant weight loss or symptoms of dehydration    - SEVERE: 6 or more times/day, vomits everything or nearly everything, with significant weight loss, symptoms of dehydration      Unsure, "All night" and x2 this AM.  Information provided by daughter. Pt not present. 2. ONSET: "When did the vomiting begin?"      Last night 3. FLUIDS: "What fluids or food have you vomited up today?" "Have you been able to keep any fluids down?"     Reports yes, able to keep down some fluids, no food. 4. ABDOMINAL PAIN: "Are your having any abdominal pain?" If yes : "How bad is it and what does it feel like?" (e.g., crampy, dull, intermittent, constant)       5. DIARRHEA: "Is there any diarrhea?" If so, ask: "How many times today?"       6. CONTACTS: "Is there anyone else in the family with the same symptoms?"       7. CAUSE: "What do you think is causing your vomiting?"     Meds Rx yesterday for URI. 8. HYDRATION STATUS: "Any signs of dehydration?" (e.g., dry mouth [not only dry lips], too weak to stand) "When did you last urinate?"    Per daughter "No". 9. OTHER SYMPTOMS: "Do you have any other symptoms?" (e.g., fever, headache, vertigo, vomiting blood or coffee grounds, recent head injury)  Sleepy from phenergan per daughter  Protocols used: [facility]

## 2017-08-19 NOTE — Telephone Encounter (Signed)
Follow up call: No further emesis. "much better."  Able to stay hydrated with Gatorade. "Not as sleepy."

## 2017-08-20 NOTE — Telephone Encounter (Signed)
Pt's daughter called and asked for guidance to help w/ mothers condition, [facility] spoke w/ triage nurse Larene Beach and pt was given the same advice that was given earlier from a nurse and from Merrie Roof; pt's daughter should go get Zofran to help b/c medication has not been picked up and they will, pt will make sure to take antibiotic w/ meals and to try to space them out

## 2017-08-21 ENCOUNTER — Ambulatory Visit: Payer: Self-pay | Admitting: *Deleted

## 2017-08-21 MED ORDER — AZITHROMYCIN 250 MG PO TABS: ORAL_TABLET | ORAL | 0 refills | Status: DC

## 2017-08-21 NOTE — Telephone Encounter (Signed)
Just FYI.

## 2017-08-21 NOTE — Assessment & Plan Note (Signed)
Stable and WNL, medication refilled today. Continue current regimen

## 2017-08-21 NOTE — Telephone Encounter (Signed)
Patient's daughter notified.

## 2017-08-21 NOTE — Telephone Encounter (Signed)
Ok to d/c the amoxil due to side effects, sent over zpack for her to start instead to [facility] [place]

## 2017-08-21 NOTE — Telephone Encounter (Signed)
   Reason for Disposition . Vomiting a prescription medication  Answer Assessment - Initial Assessment Questions Patient was seen on 3/12 and prescribed Amoxil. She began vomiting that night after the 1st dose and now won't take the Amoxil even though the vomiting has subsided. Yesterday and this morning she has kept down fluids but won't take Amoxil for fear of another round of vomiting. Her daughter is requesting a change in the antibiotic so the patient will take it.    1. VOMITING SEVERITY: "How many times have you vomited in the past 24 hours?"     - MILD:  1 - 2 times/day    - MODERATE: 3 - 5 times/day, decreased oral intake without significant weight loss or symptoms of dehydration    - SEVERE: 6 or more times/day, vomits everything or nearly everything, with significant weight loss, symptoms of dehydration      3 times yesterday 2. ONSET: "When did the vomiting begin?"    Tuesday night after taking 1st dose of Amoxil 3. FLUIDS: "What fluids or food have you vomited up today?" "Have you been able to keep any fluids down?"     Keeping fluids down last nigh and this morning after taking the zofran. 4. ABDOMINAL PAIN: "Are your having any abdominal pain?" If yes : "How bad is it and what does it feel like?" (e.g., crampy, dull, intermittent, constant)  no 5. DIARRHEA: "Is there any diarrhea?" If so, ask: "How many times today?"  no 6. CONTACTS: "Is there anyone else in the family with the same symptoms?"    no 7. CAUSE: "What do you think is causing your vomiting?"    Amoxil  8. HYDRATION STATUS: "Any signs of dehydration?" (e.g., dry mouth [not only dry lips], too weak to stand) "When did you last urinate?" no 9. OTHER SYMPTOMS: "Do you have any other symptoms?" (e.g., fever, headache, vertigo, vomiting blood or coffee grounds, recent head injury)     Headache yesterday 10. PREGNANCY: "Is there any chance you are pregnant?" "When was your last menstrual period?"    no  Protocols  used: [facility]

## 2017-08-24 ENCOUNTER — Ambulatory Visit: Payer: Medicare Other | Admitting: Unknown Physician Specialty

## 2017-08-25 NOTE — Assessment & Plan Note (Signed)
Discussed importance of good allergy regimen to keep chronic cough and rhinorrhea at bay. Zyrtec and flonase sent, sinus rinses, humidifier

## 2017-08-25 NOTE — Assessment & Plan Note (Addendum)
Will tx with tessalon, tussionex, and plain mucinex. If no improvement over the next few days can start zpack. Start advair BID. Instructions given with that. Supportive care and sedation precautions reviewed at length

## 2017-09-03 IMAGING — MR MR CERVICAL SPINE W/O CM
5 series · 35 of 48 positions shown · non-contrast
Comparison: None.

CLINICAL DATA: Constant neck pain for 4-5 months occasionally
radiating into the right side of the head.

EXAM:
MRI CERVICAL SPINE WITHOUT CONTRAST
TECHNIQUE: Multiplanar, multisequence MR imaging of the cervical spine was
performed. No intravenous contrast was administered.

[Series 3: T2 · sagittal · 3.0mm · 0.70mm/px · 8 of 15 slices shown (1 of 2)]
[im 1/15]
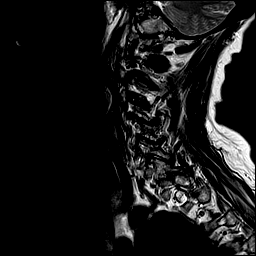
[im 3/15]
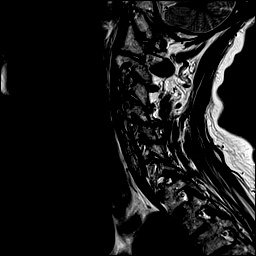
[im 5/15]
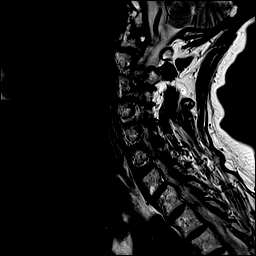
[im 7/15]
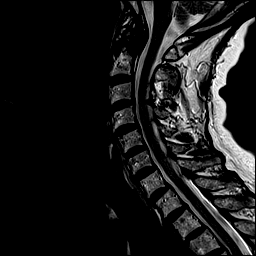
[im 9/15]
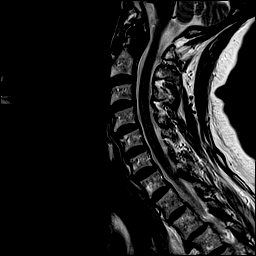
[im 11/15]
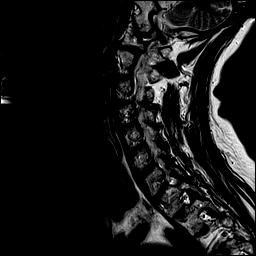
[im 13/15]
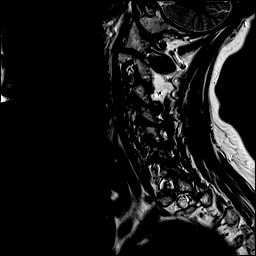
[im 15/15]
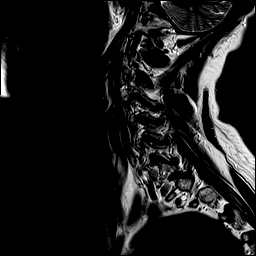

[Series 4: T1 · sagittal · 3.0mm · 0.70mm/px · 7 of 15 slices shown]
[im 1/15]
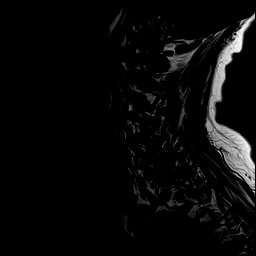
[im 3/15]
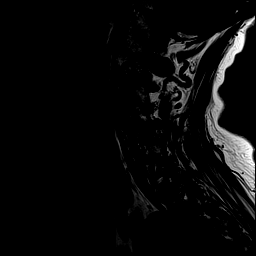
[im 5/15]
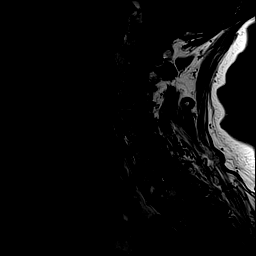
[im 8/15]
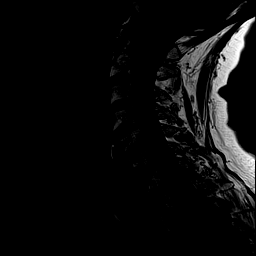
[im 10/15]
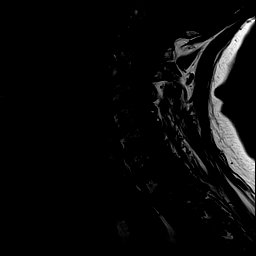
[im 12/15]
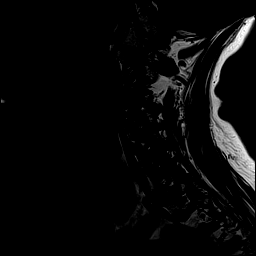
[im 15/15]
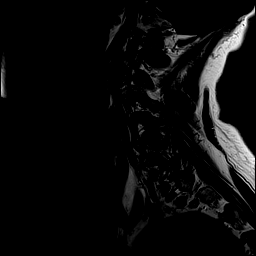

[Series 5: STIR · sagittal · 3.0mm · 0.35mm/px · 7 of 15 slices shown]
[im 1/15]
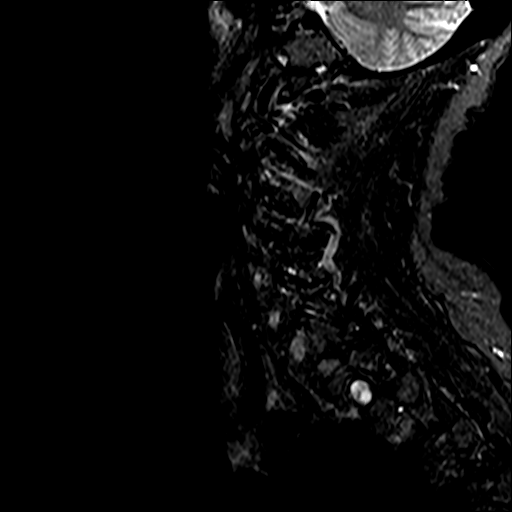
[im 3/15]
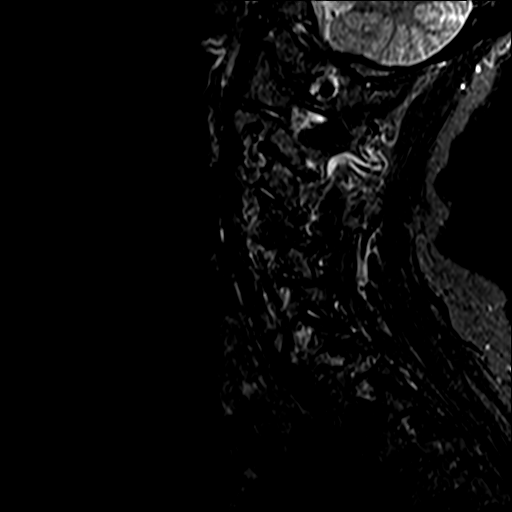
[im 5/15]
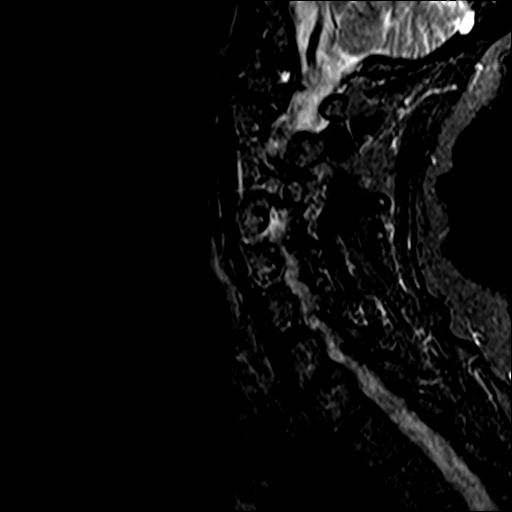
[im 8/15]
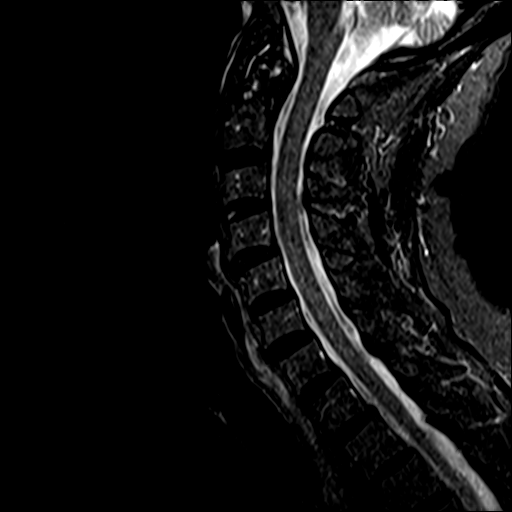
[im 10/15]
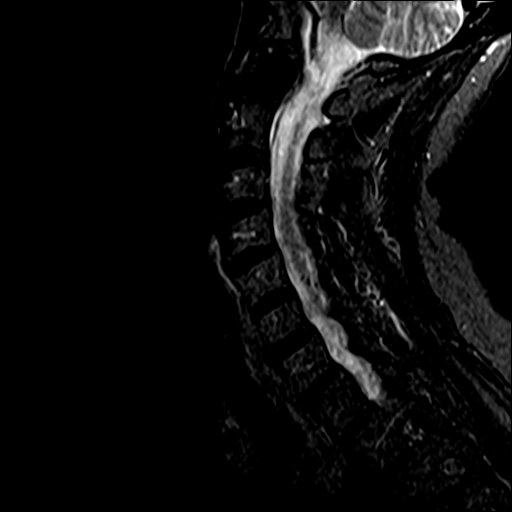
[im 12/15]
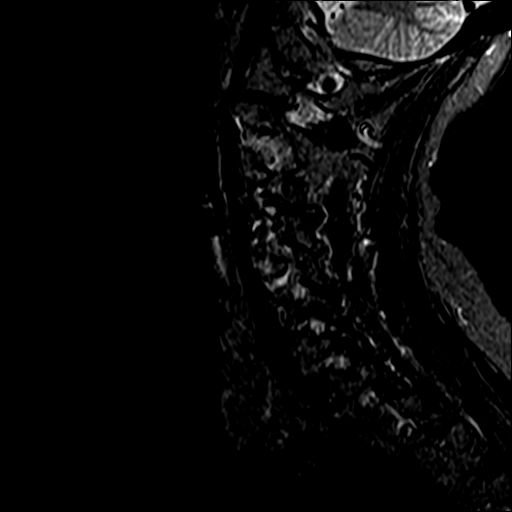
[im 15/15]
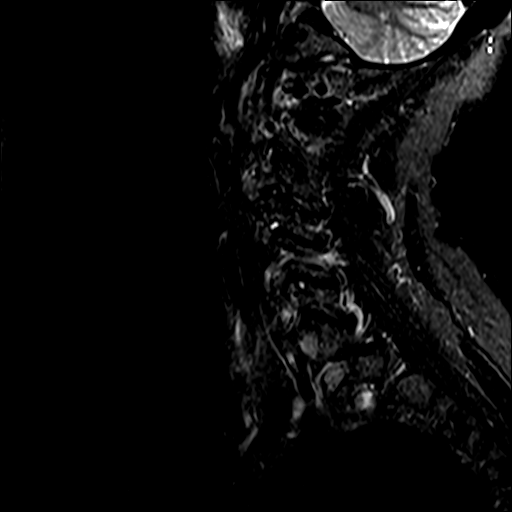

[Series 6: T2 · axial · 3.0mm · 0.70mm/px · z∈[-36,+69]mm · 9 of 28 slices shown (2 of 2)]
[im 1/28]
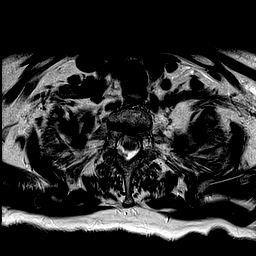
[im 5/28]
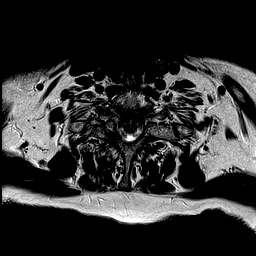
[im 10/28]
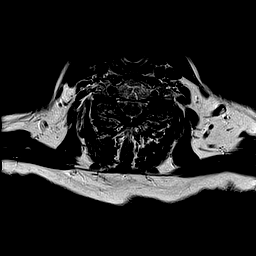
[im 12/28]
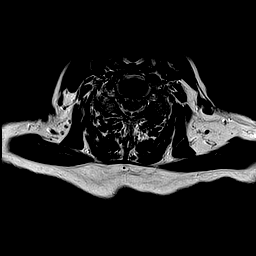
[im 14/28]
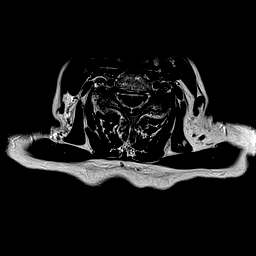
[im 16/28]
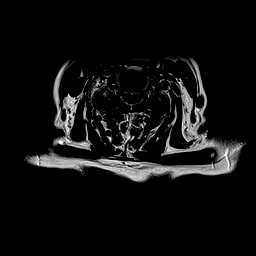
[im 19/28]
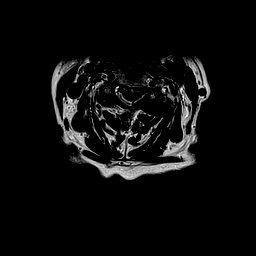
[im 23/28]
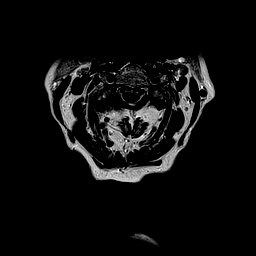
[im 28/28]
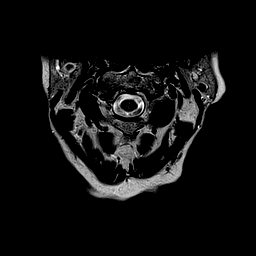

[Series 7: mpgr ax · axial · 3.0mm · 0.35mm/px · z∈[-36,+7]mm · 4 of 28 slices shown]
[im 1/28]
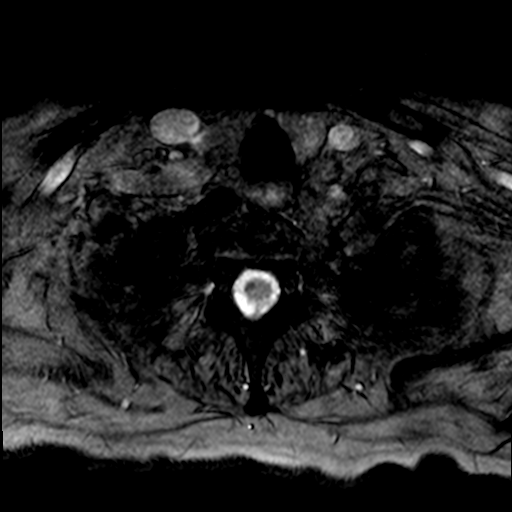
[im 5/28]
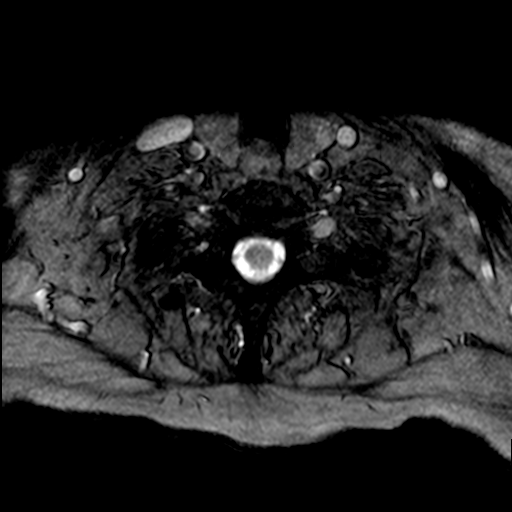
[im 10/28]
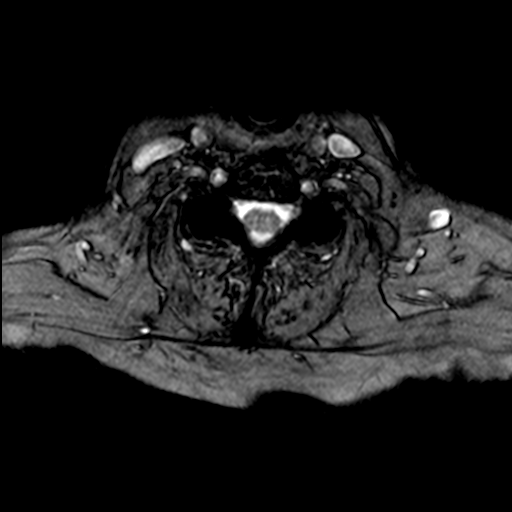
[im 12/28]
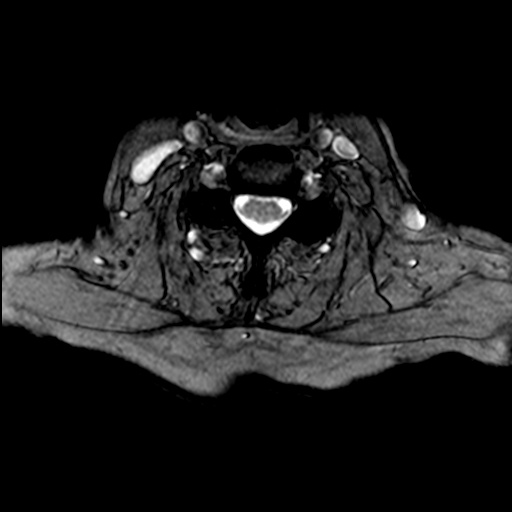

[35 of 48 positions shown; findings below may reference images not displayed]

FINDINGS: Alignment: Trace anterolisthesis is seen from C4-5 to C7-T1 due to
facet arthropathy.

Vertebrae: Height and signal are normal.

Cord: Normal signal throughout.

Posterior Fossa, vertebral arteries, paraspinal tissues: Negative.

Disc levels:

C2-3: Right worse than left facet degenerative disease. Tiny disc
bulge. The central canal and foramina are open.

C3-4: Advanced facet degenerative disease is worse on the left.
Minimal disc bulge without central canal stenosis. Facet
degenerative change causes mild to moderate bilateral foraminal
narrowing.

C4-5: Advanced bilateral facet degenerative change. Otherwise
negative.

C5-6: Advanced bilateral facet degenerative disease causes mild
right foraminal narrowing which is more notable on the right. The
central canal is open.

C6-7:  Bilateral facet degenerative change.  Otherwise negative.

C7-T1: Bilateral facet degenerative change.  Otherwise negative.
IMPRESSION: Negative for central canal narrowing. Multilevel facet arthropathy
is seen hand causes mild to moderate bilateral foraminal narrowing
C3-4 and mild right foraminal narrowing at C5-6.

## 2017-09-15 ENCOUNTER — Encounter: Payer: Self-pay | Admitting: Unknown Physician Specialty

## 2017-09-15 ENCOUNTER — Ambulatory Visit (INDEPENDENT_AMBULATORY_CARE_PROVIDER_SITE_OTHER): Payer: Medicare Other | Admitting: Unknown Physician Specialty

## 2017-09-15 DIAGNOSIS — M542 Cervicalgia: Secondary | ICD-10-CM | POA: Diagnosis not present

## 2017-09-15 DIAGNOSIS — N183 Chronic kidney disease, stage 3 unspecified: Secondary | ICD-10-CM | POA: Insufficient documentation

## 2017-09-15 DIAGNOSIS — E782 Mixed hyperlipidemia: Secondary | ICD-10-CM | POA: Diagnosis not present

## 2017-09-15 DIAGNOSIS — I10 Essential (primary) hypertension: Secondary | ICD-10-CM

## 2017-09-15 DIAGNOSIS — J441 Chronic obstructive pulmonary disease with (acute) exacerbation: Secondary | ICD-10-CM

## 2017-09-15 MED ORDER — DICLOFENAC SODIUM 1 % TD GEL: 4.0000 g | Freq: Four times a day (QID) | TRANSDERMAL | 12 refills | Status: DC

## 2017-09-15 MED ORDER — CETIRIZINE HCL 10 MG PO TABS: 10.0000 mg | ORAL_TABLET | Freq: Every day | ORAL | 11 refills | Status: DC

## 2017-09-15 MED ORDER — FLUTICASONE PROPIONATE 50 MCG/ACT NA SUSP: 2.0000 | Freq: Two times a day (BID) | NASAL | 6 refills | Status: DC

## 2017-09-15 NOTE — Progress Notes (Signed)
Dictation #1 [id number]  [id number]   BP 128/71   Pulse 93   Temp 98.1 F (36.7 C) (Oral)   Ht 5\' 1"  (1.549 m)   Wt 139 lb 9.6 oz (63.3 kg)   LMP 11/28/1971 (Approximate)   SpO2 97%   BMI 26.38 kg/m    Subjective:    Patient ID: Karen Velasquez, female    DOB: 1930/08/07, 84 y.o.   MRN: [id number]  HPI: Karen Velasquez is a 84 y.o. female  Chief Complaint  Patient presents with  . Hyperlipidemia  . Hypertension   COPD States her breathing is about the same.  Using rescue inhaler daily.    Neck pain Pt with persistent neck pain.  She had a MRI and CT scan with diagnosis of arthritis.  Reviewed MRI and CT showing severe OA.  Wondering if there is a treatment.    Hypertension Using medications without difficulty Average home BPs Not checking   No problems or lightheadedness No chest pain with exertion or shortness of breath No Edema  Hyperlipidemia Using medications without problems: No Muscle aches  Diet compliance:Exercise: Does not exercise much.  Watches what she eats.    Relevant past medical, surgical, family and social history reviewed and updated as indicated. Interim medical history since our last visit reviewed. Allergies and medications reviewed and updated.  Review of Systems  Constitutional: Negative.   HENT: Negative.   Eyes: Negative.   Respiratory: Negative.   Cardiovascular: Negative.   Gastrointestinal: Negative.   Endocrine: Negative.   Genitourinary: Negative.   Musculoskeletal: Negative.   Skin: Negative.   Allergic/Immunologic: Negative.   Neurological: Negative.   Hematological: Negative.   Psychiatric/Behavioral: Negative.     Per HPI unless specifically indicated above     Objective:    BP 128/71   Pulse 93   Temp 98.1 F (36.7 C) (Oral)   Ht 5\' 1"  (1.549 m)   Wt 139 lb 9.6 oz (63.3 kg)   LMP 11/28/1971 (Approximate)   SpO2 97%   BMI 26.38 kg/m   Wt Readings from Last 3 Encounters:  09/15/17 139 lb 9.6 oz (63.3 kg)   08/18/17 141 lb 3.2 oz (64 kg)  02/24/17 137 lb (62.1 kg)    Physical Exam  Constitutional: She is oriented to person, place, and time. She appears well-developed and well-nourished. No distress.  HENT:  Head: Normocephalic and atraumatic.  Eyes: Conjunctivae and lids are normal. Right eye exhibits no discharge. Left eye exhibits no discharge. No scleral icterus.  Neck: Normal range of motion. Neck supple. No JVD present. Carotid bruit is not present.  Cardiovascular: Normal rate, regular rhythm and normal heart sounds.  Pulmonary/Chest: Effort normal and breath sounds normal.  Abdominal: Normal appearance. There is no splenomegaly or hepatomegaly.  Musculoskeletal: Normal range of motion.  Neurological: She is alert and oriented to person, place, and time.  Skin: Skin is warm, dry and intact. No rash noted. No pallor.  Psychiatric: She has a normal mood and affect. Her behavior is normal. Judgment and thought content normal.    Results for orders placed or performed in visit on 02/24/17  Comprehensive metabolic panel  Result Value Ref Range   Glucose 98 65 - 99 mg/dL   BUN 29 (H) 8 - 27 mg/dL   Creatinine, Ser 1.03 (H) 0.57 - 1.00 mg/dL   GFR calc non Af Amer 50 (L) >59 mL/min/1.73   GFR calc Af Amer 58 (L) >59 mL/min/1.73   BUN/Creatinine Ratio 28 12 -  28   Sodium 143 134 - 144 mmol/L   Potassium 4.5 3.5 - 5.2 mmol/L   Chloride 107 (H) 96 - 106 mmol/L   CO2 22 20 - 29 mmol/L   Calcium 9.3 8.7 - 10.3 mg/dL   Total Protein 6.5 6.0 - 8.5 g/dL   Albumin 4.3 3.5 - 4.7 g/dL   Globulin, Total 2.2 1.5 - 4.5 g/dL   Albumin/Globulin Ratio 2.0 1.2 - 2.2   Bilirubin Total 0.3 0.0 - 1.2 mg/dL   Alkaline Phosphatase 35 (L) 39 - 117 IU/L   AST 15 0 - 40 IU/L   ALT 14 0 - 32 IU/L  Lipid Panel w/o Chol/HDL Ratio  Result Value Ref Range   Cholesterol, Total 154 100 - 199 mg/dL   Triglycerides 107 0 - 149 mg/dL   HDL 60 >39 mg/dL   VLDL Cholesterol Cal 21 5 - 40 mg/dL   LDL Calculated  73 0 - 99 mg/dL  CBC with Differential/Platelet  Result Value Ref Range   WBC 5.3 3.4 - 10.8 x10E3/uL   RBC 3.84 3.77 - 5.28 x10E6/uL   Hemoglobin 11.4 11.1 - 15.9 g/dL   Hematocrit 35.0 34.0 - 46.6 %   MCV 91 79 - 97 fL   MCH 29.7 26.6 - 33.0 pg   MCHC 32.6 31.5 - 35.7 g/dL   RDW 15.6 (H) 12.3 - 15.4 %   Platelets 214 150 - 379 x10E3/uL   Neutrophils 59 Not Estab. %   Lymphs 30 Not Estab. %   Monocytes 8 Not Estab. %   Eos 2 Not Estab. %   Basos 1 Not Estab. %   Neutrophils Absolute 3.2 1.4 - 7.0 x10E3/uL   Lymphocytes Absolute 1.6 0.7 - 3.1 x10E3/uL   Monocytes Absolute 0.4 0.1 - 0.9 x10E3/uL   EOS (ABSOLUTE) 0.1 0.0 - 0.4 x10E3/uL   Basophils Absolute 0.0 0.0 - 0.2 x10E3/uL   Immature Granulocytes 0 Not Estab. %   Immature Grans (Abs) 0.0 0.0 - 0.1 x10E3/uL      Assessment & Plan:   Problem List Items Addressed This Visit      Unprioritized   Cervical pain (neck)    Severe OA with some stenosis.  Try Voltaren gel.        CKD (chronic kidney disease) stage 3, GFR 30-59 ml/min (HCC)    Last GFR was 50.  Recheck today.  Not taking NSAIDs but did not drink much this AM      Relevant Orders   Comprehensive metabolic panel   CBC with Differential/Platelet   VITAMIN D 25 Hydroxy (Vit-D Deficiency, Fractures)   COPD (chronic obstructive pulmonary disease) (HCC)    Stable, continue present medications.        Relevant Medications   cetirizine (ZYRTEC) 10 MG tablet   fluticasone (FLONASE) 50 MCG/ACT nasal spray   Hyperlipidemia    LDL 73 last visit.  Check again today      Relevant Orders   Lipid Panel w/o Chol/HDL Ratio   Hypertension    Stable, continue present medications.            Follow up plan: Return in about 6 months (around 03/17/2018).

## 2017-09-15 NOTE — Assessment & Plan Note (Signed)
Severe OA with some stenosis.  Try Voltaren gel.

## 2017-09-15 NOTE — Assessment & Plan Note (Signed)
LDL 73 last visit.  Check again today

## 2017-09-15 NOTE — Assessment & Plan Note (Signed)
Stable, continue present medications.   

## 2017-09-15 NOTE — Assessment & Plan Note (Signed)
Last GFR was 50.  Recheck today.  Not taking NSAIDs but did not drink much this AM

## 2017-09-16 LAB — VITAMIN D 25 HYDROXY (VIT D DEFICIENCY, FRACTURES): VIT D 25 HYDROXY: 33.5 ng/mL (ref 30.0–100.0)

## 2017-09-16 LAB — COMPREHENSIVE METABOLIC PANEL
ALT: 18 IU/L (ref 0–32)
AST: 20 IU/L (ref 0–40)
Albumin/Globulin Ratio: 1.6 (ref 1.2–2.2)
Albumin: 4.1 g/dL (ref 3.5–4.7)
Alkaline Phosphatase: 41 IU/L (ref 39–117)
BILIRUBIN TOTAL: 0.3 mg/dL (ref 0.0–1.2)
BUN/Creatinine Ratio: 22 (ref 12–28)
BUN: 22 mg/dL (ref 8–27)
CALCIUM: 9.7 mg/dL (ref 8.7–10.3)
CHLORIDE: 104 mmol/L (ref 96–106)
CO2: 25 mmol/L (ref 20–29)
Creatinine, Ser: 1 mg/dL (ref 0.57–1.00)
GFR calc Af Amer: 59 mL/min/{1.73_m2} — ABNORMAL LOW (ref >59)
GFR, EST NON AFRICAN AMERICAN: 51 mL/min/{1.73_m2} — AB (ref >59)
GLUCOSE: 96 mg/dL (ref 65–99)
Globulin, Total: 2.5 g/dL (ref 1.5–4.5)
POTASSIUM: 4.1 mmol/L (ref 3.5–5.2)
Sodium: 143 mmol/L (ref 134–144)
TOTAL PROTEIN: 6.6 g/dL (ref 6.0–8.5)

## 2017-09-16 LAB — CBC WITH DIFFERENTIAL/PLATELET
BASOS ABS: 0 10*3/uL (ref 0.0–0.2)
Basos: 1 %
EOS (ABSOLUTE): 0.2 10*3/uL (ref 0.0–0.4)
Eos: 4 %
HEMOGLOBIN: 11.5 g/dL (ref 11.1–15.9)
Hematocrit: 35.1 % (ref 34.0–46.6)
IMMATURE GRANULOCYTES: 0 %
Immature Grans (Abs): 0 10*3/uL (ref 0.0–0.1)
LYMPHS: 28 %
Lymphocytes Absolute: 1.2 10*3/uL (ref 0.7–3.1)
MCH: 29.4 pg (ref 26.6–33.0)
MCHC: 32.8 g/dL (ref 31.5–35.7)
MCV: 90 fL (ref 79–97)
MONOCYTES: 7 %
Monocytes Absolute: 0.3 10*3/uL (ref 0.1–0.9)
NEUTROS PCT: 60 %
Neutrophils Absolute: 2.7 10*3/uL (ref 1.4–7.0)
Platelets: 190 10*3/uL (ref 150–379)
RBC: 3.91 x10E6/uL (ref 3.77–5.28)
RDW: 15.4 % (ref 12.3–15.4)
WBC: 4.4 10*3/uL (ref 3.4–10.8)

## 2017-09-16 LAB — LIPID PANEL W/O CHOL/HDL RATIO
Cholesterol, Total: 147 mg/dL (ref 100–199)
HDL: 63 mg/dL (ref >39)
LDL CALC: 73 mg/dL (ref 0–99)
Triglycerides: 56 mg/dL (ref 0–149)
VLDL CHOLESTEROL CAL: 11 mg/dL (ref 5–40)

## 2017-09-18 ENCOUNTER — Encounter: Payer: Self-pay | Admitting: Unknown Physician Specialty

## 2018-02-04 DIAGNOSIS — M5136 Other intervertebral disc degeneration, lumbar region: Secondary | ICD-10-CM | POA: Insufficient documentation

## 2018-02-04 DIAGNOSIS — M1611 Unilateral primary osteoarthritis, right hip: Secondary | ICD-10-CM | POA: Diagnosis not present

## 2018-03-01 ENCOUNTER — Other Ambulatory Visit: Payer: Self-pay | Admitting: Family Medicine

## 2018-03-03 DIAGNOSIS — Z79899 Other long term (current) drug therapy: Secondary | ICD-10-CM | POA: Diagnosis not present

## 2018-03-03 DIAGNOSIS — M5416 Radiculopathy, lumbar region: Secondary | ICD-10-CM | POA: Diagnosis not present

## 2018-03-09 DIAGNOSIS — M5416 Radiculopathy, lumbar region: Secondary | ICD-10-CM | POA: Diagnosis not present

## 2018-03-16 DIAGNOSIS — M5416 Radiculopathy, lumbar region: Secondary | ICD-10-CM | POA: Diagnosis not present

## 2018-03-16 DIAGNOSIS — M545 Low back pain: Secondary | ICD-10-CM | POA: Diagnosis not present

## 2018-03-16 DIAGNOSIS — M7062 Trochanteric bursitis, left hip: Secondary | ICD-10-CM | POA: Diagnosis not present

## 2018-03-22 ENCOUNTER — Encounter: Payer: Self-pay | Admitting: Family Medicine

## 2018-03-22 ENCOUNTER — Ambulatory Visit (INDEPENDENT_AMBULATORY_CARE_PROVIDER_SITE_OTHER): Payer: Medicare Other | Admitting: Family Medicine

## 2018-03-22 VITALS — BP 127/74 | HR 67 | Temp 98.5°F | Ht 61.0 in | Wt 145.9 lb

## 2018-03-22 DIAGNOSIS — I10 Essential (primary) hypertension: Secondary | ICD-10-CM | POA: Diagnosis not present

## 2018-03-22 DIAGNOSIS — N183 Chronic kidney disease, stage 3 unspecified: Secondary | ICD-10-CM

## 2018-03-22 DIAGNOSIS — Z0000 Encounter for general adult medical examination without abnormal findings: Secondary | ICD-10-CM | POA: Diagnosis not present

## 2018-03-22 DIAGNOSIS — I129 Hypertensive chronic kidney disease with stage 1 through stage 4 chronic kidney disease, or unspecified chronic kidney disease: Secondary | ICD-10-CM | POA: Diagnosis not present

## 2018-03-22 DIAGNOSIS — E782 Mixed hyperlipidemia: Secondary | ICD-10-CM

## 2018-03-22 DIAGNOSIS — Z23 Encounter for immunization: Secondary | ICD-10-CM

## 2018-03-22 DIAGNOSIS — J441 Chronic obstructive pulmonary disease with (acute) exacerbation: Secondary | ICD-10-CM

## 2018-03-22 DIAGNOSIS — Z780 Asymptomatic menopausal state: Secondary | ICD-10-CM | POA: Diagnosis not present

## 2018-03-22 LAB — UA/M W/RFLX CULTURE, ROUTINE
BILIRUBIN UA: NEGATIVE
GLUCOSE, UA: NEGATIVE
Ketones, UA: NEGATIVE
NITRITE UA: NEGATIVE
Protein, UA: NEGATIVE
RBC, UA: NEGATIVE
SPEC GRAV UA: 1.02 (ref 1.005–1.030)
Urobilinogen, Ur: 0.2 mg/dL (ref 0.2–1.0)
pH, UA: 5.5 (ref 5.0–7.5)

## 2018-03-22 LAB — MICROSCOPIC EXAMINATION: BACTERIA UA: NONE SEEN

## 2018-03-22 LAB — MICROALBUMIN, URINE WAIVED
Creatinine, Urine Waived: 200 mg/dL (ref 10–300)
Microalb, Ur Waived: 10 mg/L (ref 0–19)

## 2018-03-22 MED ORDER — LISINOPRIL-HYDROCHLOROTHIAZIDE 10-12.5 MG PO TABS: 1.0000 | ORAL_TABLET | Freq: Every day | ORAL | 1 refills | Status: DC

## 2018-03-22 MED ORDER — FLUTICASONE PROPIONATE 50 MCG/ACT NA SUSP: 2.0000 | Freq: Two times a day (BID) | NASAL | 12 refills | Status: DC

## 2018-03-22 MED ORDER — FENOFIBRATE MICRONIZED 130 MG PO CAPS: ORAL_CAPSULE | ORAL | 1 refills | Status: DC

## 2018-03-22 MED ORDER — EZETIMIBE 10 MG PO TABS: 10.0000 mg | ORAL_TABLET | Freq: Every day | ORAL | 1 refills | Status: DC

## 2018-03-22 MED ORDER — DICLOFENAC SODIUM 1 % TD GEL: 4.0000 g | Freq: Four times a day (QID) | TRANSDERMAL | 12 refills | Status: DC

## 2018-03-22 MED ORDER — FLUTICASONE-SALMETEROL 250-50 MCG/DOSE IN AEPB: INHALATION_SPRAY | RESPIRATORY_TRACT | 1 refills | Status: DC

## 2018-03-22 NOTE — Assessment & Plan Note (Signed)
Under good control on current regimen. Continue current regimen. Continue to monitor. Call with any concerns. Refills given.   

## 2018-03-22 NOTE — Patient Instructions (Addendum)
Preventative Services:  Health Risk Assessment and Personalized Prevention Plan: Done  Bone Mass Measurements: Ordered Today Breast Cancer Screening: N/A CVD Screening: Done today Cervical Cancer Screening: N/A Colon Cancer Screening: N/A Depression Screening: Done today Diabetes Screening: Done today Glaucoma Screening: See your eye doctor Hepatitis B vaccine: N/A Hepatitis C screening: N/A HIV Screening: N/A Flu Vaccine: Done today Lung cancer Screening: N/A Obesity Screening: Done today Pneumonia Vaccines (2): Up to date STI Screening: N/A

## 2018-03-22 NOTE — Progress Notes (Signed)
BP 127/74   Pulse 67   Temp 98.5 F (36.9 C) (Oral)   Ht 5\' 1"  (1.549 m)   Wt 145 lb 14.4 oz (66.2 kg)   LMP 11/28/1971 (Approximate)   SpO2 97%   BMI 27.57 kg/m    Subjective:    Patient ID: Karen Velasquez, female    DOB: 1930-12-05, 84 y.o.   MRN: [id number]  HPI: Karen Velasquez is a 84 y.o. female presenting on 03/22/2018 for comprehensive medical examination. Current medical complaints include:  Has been having a lot of pain in her back and her hip for about a month. Seeing orthopedics. To have MRIs tomorrow.   HYPERTENSION / HYPERLIPIDEMIA Satisfied with current treatment? no Duration of hypertension: chronic BP monitoring frequency: not checking BP medication side effects: no Past BP meds: lisinopril-hctz Duration of hyperlipidemia: chronic Cholesterol medication side effects: no Cholesterol supplements: none Past cholesterol medications: zetia, fenofibrate Medication compliance: excellent compliance Aspirin: yes Recent stressors: no Recurrent headaches: no Visual changes: yes Palpitations: no Dyspnea: no Chest pain: no Lower extremity edema: no Dizzy/lightheaded: no  COPD COPD status: controlled Satisfied with current treatment?: yes Oxygen use: no Dyspnea frequency: none Cough frequency: rarely Rescue inhaler frequency: rarely   Limitation of activity: no Productive cough: no Pneumovax: Up to Date Influenza: Up to Date  Menopausal Symptoms: no  Functional Status Survey: Is the patient deaf or have difficulty hearing?: Yes Does the patient have difficulty seeing, even when wearing glasses/contacts?: No Does the patient have difficulty concentrating, remembering, or making decisions?: No Does the patient have difficulty walking or climbing stairs?: No Does the patient have difficulty dressing or bathing?: No Does the patient have difficulty doing errands alone such as visiting a doctor's office or shopping?: No  Fall Risk  03/22/2018 02/24/2017  02/25/2016 12/29/2014 11/22/2014  Falls in the past year? No No No No No    Depression Screen Depression screen [facility] 2/9 03/22/2018 02/24/2017 02/25/2016 12/29/2014 11/22/2014  Decreased Interest 0 0 0 0 (No Data)  Down, Depressed, Hopeless 0 0 0 0 0  PHQ - 2 Score 0 0 0 0 0  Altered sleeping - 0 - - -  Tired, decreased energy - 1 - - -  Change in appetite - 0 - - -  Feeling bad or failure about yourself  - 0 - - -  Trouble concentrating - 0 - - -  Moving slowly or fidgety/restless - 0 - - -  Suicidal thoughts - 0 - - -  PHQ-9 Score - 1 - - -   Advanced Directives Does patient have a HCPOA?    yes Does patient have a living will or MOST form?  yes  Past Medical History:  Past Medical History:  Diagnosis Date  . Hyperlipidemia   . Hypertension     Surgical History:  Past Surgical History:  Procedure Laterality Date  . ABDOMINAL HYSTERECTOMY    . APPENDECTOMY     2105  . cornea implant     3,4 yrs ago  . PARTIAL HYSTERECTOMY     66yrs of age  . ROTATOR CUFF REPAIR     70yr ago  . TONSILLECTOMY     as a child  . X-STOP IMPLANTATION N/A    46yrs ago    Medications:  Current Outpatient Medications on File Prior to Visit  Medication Sig  . acetaminophen-codeine (TYLENOL #4) 300-60 MG tablet Take 1 tablet by mouth 2 (two) times daily.  . Calcium Carbonate-Vitamin D 600-400 MG-UNIT per  tablet Take 1 tablet by mouth daily.   . cetirizine (ZYRTEC) 10 MG tablet Take 1 tablet (10 mg total) by mouth daily.  Marland Kitchen EXTRA STRENGTH ACETAMINOPHEN PO Take 1 tablet by mouth every 6 (six) hours as needed.  . meclizine (ANTIVERT) 25 MG tablet Take 1 tablet (25 mg total) by mouth 3 (three) times daily as needed for dizziness.  . naftifine (NAFTIN) 1 % cream Apply 1 application topically as needed.  . ondansetron (ZOFRAN) 4 MG tablet Take 1 tablet (4 mg total) by mouth every 8 (eight) hours as needed for nausea or vomiting.  . vitamin C (ASCORBIC ACID) 500 MG tablet Take by mouth.   No current  facility-administered medications on file prior to visit.     Allergies:  Allergies  Allergen Reactions  . Augmentin [Amoxicillin-Pot Clavulanate] Nausea And Vomiting    Social History:  Social History   Socioeconomic History  . Marital status: Widowed    Spouse name: Not on file  . Number of children: 1  . Years of education: Not on file  . Highest education level: Not on file  Occupational History  . Occupation: retired  Scientific laboratory technician  . Financial resource strain: Not on file  . Food insecurity:    Worry: Not on file    Inability: Not on file  . Transportation needs:    Medical: Not on file    Non-medical: Not on file  Tobacco Use  . Smoking status: Former Smoker    Packs/day: 0.50    Types: Cigarettes    Last attempt to quit: 08/25/2016    Years since quitting: 1.5  . Smokeless tobacco: Never Used  Substance and Sexual Activity  . Alcohol use: No    Alcohol/week: 0.0 standard drinks  . Drug use: No  . Sexual activity: Not Currently  Lifestyle  . Physical activity:    Days per week: Not on file    Minutes per session: Not on file  . Stress: Not on file  Relationships  . Social connections:    Talks on phone: Not on file    Gets together: Not on file    Attends religious service: Not on file    Active member of club or organization: Not on file    Attends meetings of clubs or organizations: Not on file    Relationship status: Not on file  . Intimate partner violence:    Fear of current or ex partner: Not on file    Emotionally abused: Not on file    Physically abused: Not on file    Forced sexual activity: Not on file  Other Topics Concern  . Not on file  Social History Narrative  . Not on file   Social History   Tobacco Use  Smoking Status Former Smoker  . Packs/day: 0.50  . Types: Cigarettes  . Last attempt to quit: 08/25/2016  . Years since quitting: 1.5  Smokeless Tobacco Never Used   Social History   Substance and Sexual Activity  Alcohol  Use No  . Alcohol/week: 0.0 standard drinks    Family History:  Family History  Problem Relation Age of Onset  . Diabetes Father   . Cancer Father   . Cancer Sister     Past medical history, surgical history, medications, allergies, family history and social history reviewed with patient today and changes made to appropriate areas of the chart.   Review of Systems  Constitutional: Negative.   HENT: Positive for sore throat. Negative  for congestion, ear discharge, ear pain, hearing loss, nosebleeds, sinus pain and tinnitus.   Eyes: Positive for blurred vision. Negative for double vision, photophobia, pain, discharge and redness.  Respiratory: Negative.  Negative for stridor.   Cardiovascular: Negative.   Gastrointestinal: Positive for heartburn. Negative for abdominal pain, blood in stool, constipation, diarrhea, melena, nausea and vomiting.  Genitourinary: Negative.   Musculoskeletal: Positive for back pain and myalgias. Negative for falls, joint pain and neck pain.  Skin: Negative.   Neurological: Negative.   Endo/Heme/Allergies: Negative.   Psychiatric/Behavioral: Negative.     All other ROS negative except what is listed above and in the HPI.      Objective:    BP 127/74   Pulse 67   Temp 98.5 F (36.9 C) (Oral)   Ht 5\' 1"  (1.549 m)   Wt 145 lb 14.4 oz (66.2 kg)   LMP 11/28/1971 (Approximate)   SpO2 97%   BMI 27.57 kg/m   Wt Readings from Last 3 Encounters:  03/22/18 145 lb 14.4 oz (66.2 kg)  09/15/17 139 lb 9.6 oz (63.3 kg)  08/18/17 141 lb 3.2 oz (64 kg)    Physical Exam  Constitutional: She is oriented to person, place, and time. She appears well-developed and well-nourished. No distress.  HENT:  Head: Normocephalic and atraumatic.  Right Ear: Hearing normal.  Left Ear: Hearing normal.  Nose: Nose normal.  Eyes: Conjunctivae and lids are normal. Right eye exhibits no discharge. Left eye exhibits no discharge. No scleral icterus.  Cardiovascular: Normal  rate, regular rhythm, normal heart sounds and intact distal pulses. Exam reveals no gallop and no friction rub.  No murmur heard. Pulmonary/Chest: Effort normal and breath sounds normal. No stridor. No respiratory distress. She has no wheezes. She has no rales. She exhibits no tenderness.  Musculoskeletal: Normal range of motion.  Neurological: She is alert and oriented to person, place, and time.  Skin: Skin is warm, dry and intact. Capillary refill takes less than 2 seconds. No rash noted. She is not diaphoretic. No erythema. No pallor.  Psychiatric: She has a normal mood and affect. Her speech is normal and behavior is normal. Judgment and thought content normal. Cognition and memory are normal.  Nursing note and vitals reviewed.   6CIT Screen 03/22/2018  What Year? 0 points  What month? 0 points  What time? 0 points  Count back from 20 0 points  Months in reverse 0 points  Repeat phrase 0 points  Total Score 0     Results for orders placed or performed in visit on 09/15/17  Lipid Panel w/o Chol/HDL Ratio  Result Value Ref Range   Cholesterol, Total 147 100 - 199 mg/dL   Triglycerides 56 0 - 149 mg/dL   HDL 63 >39 mg/dL   VLDL Cholesterol Cal 11 5 - 40 mg/dL   LDL Calculated 73 0 - 99 mg/dL  Comprehensive metabolic panel  Result Value Ref Range   Glucose 96 65 - 99 mg/dL   BUN 22 8 - 27 mg/dL   Creatinine, Ser 1.00 0.57 - 1.00 mg/dL   GFR calc non Af Amer 51 (L) >59 mL/min/1.73   GFR calc Af Amer 59 (L) >59 mL/min/1.73   BUN/Creatinine Ratio 22 12 - 28   Sodium 143 134 - 144 mmol/L   Potassium 4.1 3.5 - 5.2 mmol/L   Chloride 104 96 - 106 mmol/L   CO2 25 20 - 29 mmol/L   Calcium 9.7 8.7 - 10.3 mg/dL  Total Protein 6.6 6.0 - 8.5 g/dL   Albumin 4.1 3.5 - 4.7 g/dL   Globulin, Total 2.5 1.5 - 4.5 g/dL   Albumin/Globulin Ratio 1.6 1.2 - 2.2   Bilirubin Total 0.3 0.0 - 1.2 mg/dL   Alkaline Phosphatase 41 39 - 117 IU/L   AST 20 0 - 40 IU/L   ALT 18 0 - 32 IU/L  CBC with  Differential/Platelet  Result Value Ref Range   WBC 4.4 3.4 - 10.8 x10E3/uL   RBC 3.91 3.77 - 5.28 x10E6/uL   Hemoglobin 11.5 11.1 - 15.9 g/dL   Hematocrit 35.1 34.0 - 46.6 %   MCV 90 79 - 97 fL   MCH 29.4 26.6 - 33.0 pg   MCHC 32.8 31.5 - 35.7 g/dL   RDW 15.4 12.3 - 15.4 %   Platelets 190 150 - 379 x10E3/uL   Neutrophils 60 Not Estab. %   Lymphs 28 Not Estab. %   Monocytes 7 Not Estab. %   Eos 4 Not Estab. %   Basos 1 Not Estab. %   Neutrophils Absolute 2.7 1.4 - 7.0 x10E3/uL   Lymphocytes Absolute 1.2 0.7 - 3.1 x10E3/uL   Monocytes Absolute 0.3 0.1 - 0.9 x10E3/uL   EOS (ABSOLUTE) 0.2 0.0 - 0.4 x10E3/uL   Basophils Absolute 0.0 0.0 - 0.2 x10E3/uL   Immature Granulocytes 0 Not Estab. %   Immature Grans (Abs) 0.0 0.0 - 0.1 x10E3/uL  VITAMIN D 25 Hydroxy (Vit-D Deficiency, Fractures)  Result Value Ref Range   Vit D, 25-Hydroxy 33.5 30.0 - 100.0 ng/mL      Assessment & Plan:   Problem List Items Addressed This Visit      Cardiovascular and Mediastinum   Hypertension    Under good control on current regimen. Continue current regimen. Continue to monitor. Call with any concerns. Refills given.        Relevant Medications   ezetimibe (ZETIA) 10 MG tablet   fenofibrate micronized (ANTARA) 130 MG capsule   lisinopril-hydrochlorothiazide (PRINZIDE,ZESTORETIC) 10-12.5 MG tablet     Respiratory   COPD (chronic obstructive pulmonary disease) (HCC)    Under good control on current regimen. Continue current regimen. Continue to monitor. Call with any concerns. Refills given.        Relevant Medications   fluticasone (FLONASE) 50 MCG/ACT nasal spray   Fluticasone-Salmeterol (ADVAIR DISKUS) 250-50 MCG/DOSE AEPB   Other Relevant Orders   CBC with Differential/Platelet   Comprehensive metabolic panel   TSH   UA/M w/rflx Culture, Routine     Genitourinary   CKD (chronic kidney disease) stage 3, GFR 30-59 ml/min (HCC)    Rechecking levels today. Await results.      Relevant  Orders   CBC with Differential/Platelet   Comprehensive metabolic panel   Microalbumin, Urine Waived   TSH   UA/M w/rflx Culture, Routine     Other   Hyperlipidemia    Under good control on current regimen. Continue current regimen. Continue to monitor. Call with any concerns. Refills given.        Relevant Medications   ezetimibe (ZETIA) 10 MG tablet   fenofibrate micronized (ANTARA) 130 MG capsule   lisinopril-hydrochlorothiazide (PRINZIDE,ZESTORETIC) 10-12.5 MG tablet   Other Relevant Orders   CBC with Differential/Platelet   Comprehensive metabolic panel   Lipid Panel w/o Chol/HDL Ratio   TSH   UA/M w/rflx Culture, Routine    Other Visit Diagnoses    Encounter for Medicare annual wellness exam    -  Primary  Preventative care discussed as below.    Need for influenza vaccination       Flu shot given today.   Relevant Orders   Flu vaccine HIGH DOSE PF (Completed)   Postmenopausal estrogen deficiency       DEXA ordered today.   Relevant Orders   DG Bone Density       Preventative Services:  Health Risk Assessment and Personalized Prevention Plan: Done  Bone Mass Measurements: Ordered Today Breast Cancer Screening: N/A CVD Screening: Done today Cervical Cancer Screening: N/A Colon Cancer Screening: N/A Depression Screening: Done today Diabetes Screening: Done today Glaucoma Screening: See your eye doctor Hepatitis B vaccine: N/A Hepatitis C screening: N/A HIV Screening: N/A Flu Vaccine: Done today Lung cancer Screening: N/A Obesity Screening: Done today Pneumonia Vaccines (2): Up to date STI Screening: N/A  Follow up plan: Return in about 6 months (around 09/21/2018) for 6 month follow up.   LABORATORY TESTING:  - Pap smear: not applicable  IMMUNIZATIONS:   - Tdap: Tetanus vaccination status reviewed: Declined. - Influenza: Administered today - Pneumovax: Up to date - Prevnar: Up to date  SCREENING: -Mammogram: Not applicable  - Colonoscopy:  Not applicable  - Bone Density: Ordered today   PATIENT COUNSELING:   Advised to take 1 mg of folate supplement per day if capable of pregnancy.   Sexuality: Discussed sexually transmitted diseases, partner selection, use of condoms, avoidance of unintended pregnancy  and contraceptive alternatives.   Advised to avoid cigarette smoking.  I discussed with the patient that most people either abstain from alcohol or drink within safe limits (<=14/week and <=4 drinks/occasion for males, <=7/weeks and <= 3 drinks/occasion for females) and that the risk for alcohol disorders and other health effects rises proportionally with the number of drinks per week and how often a drinker exceeds daily limits.  Discussed cessation/primary prevention of drug use and availability of treatment for abuse.   Diet: Encouraged to adjust caloric intake to maintain  or achieve ideal body weight, to reduce intake of dietary saturated fat and total fat, to limit sodium intake by avoiding high sodium foods and not adding table salt, and to maintain adequate dietary potassium and calcium preferably from fresh fruits, vegetables, and low-fat dairy products.    stressed the importance of regular exercise  Injury prevention: Discussed safety belts, safety helmets, smoke detector, smoking near bedding or upholstery.   Dental health: Discussed importance of regular tooth brushing, flossing, and dental visits.    NEXT PREVENTATIVE PHYSICAL DUE IN 1 YEAR. Return in about 6 months (around 09/21/2018) for 6 month follow up.

## 2018-03-22 NOTE — Assessment & Plan Note (Signed)
Rechecking levels today. Await results.  

## 2018-03-23 ENCOUNTER — Encounter: Payer: Self-pay | Admitting: Family Medicine

## 2018-03-23 DIAGNOSIS — M545 Low back pain: Secondary | ICD-10-CM | POA: Diagnosis not present

## 2018-03-23 DIAGNOSIS — M5416 Radiculopathy, lumbar region: Secondary | ICD-10-CM | POA: Diagnosis not present

## 2018-03-23 DIAGNOSIS — M25551 Pain in right hip: Secondary | ICD-10-CM | POA: Diagnosis not present

## 2018-03-23 LAB — COMPREHENSIVE METABOLIC PANEL
A/G RATIO: 2 (ref 1.2–2.2)
ALBUMIN: 4.5 g/dL (ref 3.5–4.7)
ALK PHOS: 39 IU/L (ref 39–117)
ALT: 13 IU/L (ref 0–32)
AST: 11 IU/L (ref 0–40)
BILIRUBIN TOTAL: 0.3 mg/dL (ref 0.0–1.2)
BUN / CREAT RATIO: 31 — AB (ref 12–28)
BUN: 33 mg/dL — ABNORMAL HIGH (ref 8–27)
CO2: 25 mmol/L (ref 20–29)
Calcium: 9.7 mg/dL (ref 8.7–10.3)
Chloride: 104 mmol/L (ref 96–106)
Creatinine, Ser: 1.07 mg/dL — ABNORMAL HIGH (ref 0.57–1.00)
GFR calc non Af Amer: 47 mL/min/{1.73_m2} — ABNORMAL LOW (ref >59)
GFR, EST AFRICAN AMERICAN: 55 mL/min/{1.73_m2} — AB (ref >59)
Globulin, Total: 2.2 g/dL (ref 1.5–4.5)
Glucose: 96 mg/dL (ref 65–99)
Potassium: 4.6 mmol/L (ref 3.5–5.2)
Sodium: 145 mmol/L — ABNORMAL HIGH (ref 134–144)
Total Protein: 6.7 g/dL (ref 6.0–8.5)

## 2018-03-23 LAB — CBC WITH DIFFERENTIAL/PLATELET
BASOS ABS: 0.1 10*3/uL (ref 0.0–0.2)
BASOS: 1 %
EOS (ABSOLUTE): 0 10*3/uL (ref 0.0–0.4)
Eos: 1 %
Hematocrit: 34.8 % (ref 34.0–46.6)
Hemoglobin: 11.6 g/dL (ref 11.1–15.9)
Immature Grans (Abs): 0 10*3/uL (ref 0.0–0.1)
Immature Granulocytes: 0 %
LYMPHS ABS: 1.6 10*3/uL (ref 0.7–3.1)
Lymphs: 26 %
MCH: 30 pg (ref 26.6–33.0)
MCHC: 33.3 g/dL (ref 31.5–35.7)
MCV: 90 fL (ref 79–97)
Monocytes Absolute: 0.4 10*3/uL (ref 0.1–0.9)
Monocytes: 7 %
NEUTROS ABS: 4 10*3/uL (ref 1.4–7.0)
Neutrophils: 65 %
PLATELETS: 217 10*3/uL (ref 150–450)
RBC: 3.87 x10E6/uL (ref 3.77–5.28)
RDW: 13.6 % (ref 12.3–15.4)
WBC: 6.1 10*3/uL (ref 3.4–10.8)

## 2018-03-23 LAB — TSH: TSH: 0.717 u[IU]/mL (ref 0.450–4.500)

## 2018-03-23 LAB — LIPID PANEL W/O CHOL/HDL RATIO
Cholesterol, Total: 163 mg/dL (ref 100–199)
HDL: 66 mg/dL (ref >39)
LDL CALC: 83 mg/dL (ref 0–99)
Triglycerides: 68 mg/dL (ref 0–149)
VLDL CHOLESTEROL CAL: 14 mg/dL (ref 5–40)

## 2018-03-26 ENCOUNTER — Telehealth: Payer: Self-pay | Admitting: Unknown Physician Specialty

## 2018-03-26 NOTE — Telephone Encounter (Signed)
Medication removed from patient's med list per patient request.

## 2018-03-26 NOTE — Telephone Encounter (Signed)
Copied from [place] [id number]. Topic: General - Other >> Mar 26, 2018 11:05 AM Carolyn Stare wrote: Pt said she no longer use this med and would like to have it removed from her med list.   diclofenac sodium (VOLTAREN) 1 % GEL

## 2018-03-31 DIAGNOSIS — M5416 Radiculopathy, lumbar region: Secondary | ICD-10-CM | POA: Diagnosis not present

## 2018-04-05 DIAGNOSIS — M5416 Radiculopathy, lumbar region: Secondary | ICD-10-CM | POA: Diagnosis not present

## 2018-04-19 ENCOUNTER — Encounter: Payer: Self-pay | Admitting: Family Medicine

## 2018-04-19 ENCOUNTER — Ambulatory Visit: Payer: Self-pay

## 2018-04-19 ENCOUNTER — Ambulatory Visit (INDEPENDENT_AMBULATORY_CARE_PROVIDER_SITE_OTHER): Payer: Medicare Other | Admitting: Family Medicine

## 2018-04-19 VITALS — BP 111/68 | HR 80 | Temp 98.3°F | Ht 61.0 in | Wt 142.1 lb

## 2018-04-19 DIAGNOSIS — K644 Residual hemorrhoidal skin tags: Secondary | ICD-10-CM

## 2018-04-19 DIAGNOSIS — K921 Melena: Secondary | ICD-10-CM

## 2018-04-19 LAB — CBC WITH DIFFERENTIAL/PLATELET
HEMATOCRIT: 38.6 % (ref 34.0–46.6)
Hemoglobin: 12.9 g/dL (ref 11.1–15.9)
LYMPHS: 24 %
Lymphocytes Absolute: 1.7 10*3/uL (ref 0.7–3.1)
MCH: 30.5 pg (ref 26.6–33.0)
MCHC: 33.4 g/dL (ref 31.5–35.7)
MCV: 91 fL (ref 79–97)
MID (Absolute): 0.7 10*3/uL (ref 0.1–1.6)
MID: 10 %
NEUTROS PCT: 67 %
Neutrophils Absolute: 4.9 10*3/uL (ref 1.4–7.0)
PLATELETS: 253 10*3/uL (ref 150–450)
RBC: 4.23 x10E6/uL (ref 3.77–5.28)
RDW: 14.5 % (ref 12.3–15.4)
WBC: 7.3 10*3/uL (ref 3.4–10.8)

## 2018-04-19 MED ORDER — HYDROCORTISONE 2.5 % RE CREA: 1.0000 "application " | TOPICAL_CREAM | Freq: Two times a day (BID) | RECTAL | 0 refills | Status: DC

## 2018-04-19 NOTE — Progress Notes (Signed)
BP 111/68 (BP Location: Right Arm, Patient Position: Sitting, Cuff Size: Normal)   Pulse 80   Temp 98.3 F (36.8 C)   Ht 5\' 1"  (1.549 m)   Wt 142 lb 2 oz (64.5 kg)   LMP 11/28/1971 (Approximate)   SpO2 97%   BMI 26.85 kg/m    Subjective:    Patient ID: Karen Velasquez, female    DOB: [birth year], 84 y.o.   MRN: [id number]  HPI: Karen Velasquez is a 84 y.o. female  Chief Complaint  Patient presents with  . Melena   Here today with 1 week of frequent BMs (~3 times daily), everytime she eats and dark stools. Notes she was on pain medicine th epast two months which constipated her significantly, so she had started 2 weeks ago on stool softeners to help. This led right up to current issues. Stopped stool softeners and pain medicine at onset of sxs. Denies abdominal pain, SOB, weakness, fatigue, bright red blood, rectal pain, hx of hemorrhoids or GI bleed, indigestion, use of anticoagulant medications. Has been taking a few days of pepto bismol but sxs started a few days before that.   Relevant past medical, surgical, family and social history reviewed and updated as indicated. Interim medical history since our last visit reviewed. Allergies and medications reviewed and updated.  Review of Systems  Per HPI unless specifically indicated above     Objective:    BP 111/68 (BP Location: Right Arm, Patient Position: Sitting, Cuff Size: Normal)   Pulse 80   Temp 98.3 F (36.8 C)   Ht 5\' 1"  (1.549 m)   Wt 142 lb 2 oz (64.5 kg)   LMP 11/28/1971 (Approximate)   SpO2 97%   BMI 26.85 kg/m   Wt Readings from Last 3 Encounters:  04/19/18 142 lb 2 oz (64.5 kg)  03/22/18 145 lb 14.4 oz (66.2 kg)  09/15/17 139 lb 9.6 oz (63.3 kg)    Physical Exam  Constitutional: She is oriented to person, place, and time. She appears well-developed and well-nourished. No distress.  HENT:  Head: Atraumatic.  Eyes: Conjunctivae and EOM are normal.  Neck: Normal range of motion. Neck supple.    Cardiovascular: Normal rate and regular rhythm.  Pulmonary/Chest: Effort normal and breath sounds normal.  Abdominal: Soft. Bowel sounds are normal. She exhibits no distension. There is no tenderness.  Genitourinary:     Genitourinary Comments: Inflamed internal hemorrhoids palpable on digital exam. No visible blood on inspection of anal area. Nontender to palpation   Musculoskeletal: Normal range of motion.  Neurological: She is alert and oriented to person, place, and time.  Skin: Skin is warm and dry.  Psychiatric: She has a normal mood and affect. Her behavior is normal.  Nursing note and vitals reviewed.   Results for orders placed or performed in visit on 04/19/18  CBC With Differential/Platelet  Result Value Ref Range   WBC 7.3 3.4 - 10.8 x10E3/uL   RBC 4.23 3.77 - 5.28 x10E6/uL   Hemoglobin 12.9 11.1 - 15.9 g/dL   Hematocrit 38.6 34.0 - 46.6 %   MCV 91 79 - 97 fL   MCH 30.5 26.6 - 33.0 pg   MCHC 33.4 31.5 - 35.7 g/dL   RDW 14.5 12.3 - 15.4 %   Platelets 253 150 - 450 x10E3/uL   Neutrophils 67 Not Estab. %   Lymphs 24 Not Estab. %   MID 10 Not Estab. %   Neutrophils Absolute 4.9 1.4 - 7.0 x10E3/uL  Lymphocytes Absolute 1.7 0.7 - 3.1 x10E3/uL   MID (Absolute) 0.7 0.1 - 1.6 X10E3/uL      Assessment & Plan:   Problem List Items Addressed This Visit      Digestive   Melena - Primary    CBC stable. Unable to perform POC guiac, will work on getting take home testing sent to her. GI referral placed for further management. Brat diet, avoid pepto and NSAIDs. Aware to go to ER if significantly worsening prior to GI appt. Last colonoscopy unknown per patient      Relevant Orders   Ambulatory referral to Gastroenterology   CBC With Differential/Platelet (Completed)    Other Visit Diagnoses    External hemorrhoid       Likely incidental finding and due to her recent bout of constipation followed by frequent loose stools. Anusol, sitz baths reviewed       Follow up  plan: Return for as scheduled.

## 2018-04-19 NOTE — Telephone Encounter (Signed)
Pt. Reports she started having black stools last week. States she has had problems with constipation due to pain medication. Reports she has stopped taking the pain pills. Took a laxative last week and then started having diarrhea. Has a bowel movement every time she eats . Denies any pain or bright red blood.Appointment made for today. Instructed if symptoms worsen  Before appointment , to call back. Verbalizes understanding.  Reason for Disposition . [1] Abnormal color is unexplained AND [2] persists > 24 hours  Answer Assessment - Initial Assessment Questions 1. COLOR: "What color is it?" "Is that color in part or all of the stool?"     Black 2. ONSET: "When was the unusual color first noted?"     Started last week 3. CAUSE: "Have you eaten any food or taken any medicine of this color?" (See listing in BACKGROUND)     No 4. OTHER SYMPTOMS: "Do you have any other symptoms?" (e.g., diarrhea, jaundice, abdominal pain, fever).     Diarrhea - every time she eats  Protocols used: STOOLS - UNUSUAL COLOR-A-AH

## 2018-04-20 DIAGNOSIS — K921 Melena: Secondary | ICD-10-CM | POA: Insufficient documentation

## 2018-04-20 NOTE — Assessment & Plan Note (Signed)
CBC stable. Unable to perform POC guiac, will work on getting take home testing sent to her. GI referral placed for further management. Brat diet, avoid pepto and NSAIDs. Aware to go to ER if significantly worsening prior to GI appt. Last colonoscopy unknown per patient

## 2018-04-26 DIAGNOSIS — M5416 Radiculopathy, lumbar region: Secondary | ICD-10-CM | POA: Diagnosis not present

## 2018-04-29 ENCOUNTER — Other Ambulatory Visit: Payer: Self-pay

## 2018-07-15 DIAGNOSIS — H5212 Myopia, left eye: Secondary | ICD-10-CM | POA: Diagnosis not present

## 2018-07-27 DIAGNOSIS — H25012 Cortical age-related cataract, left eye: Secondary | ICD-10-CM | POA: Diagnosis not present

## 2018-08-31 ENCOUNTER — Other Ambulatory Visit: Payer: Self-pay | Admitting: Family Medicine

## 2018-09-22 ENCOUNTER — Ambulatory Visit: Payer: Medicare Other | Admitting: Family Medicine

## 2018-09-23 ENCOUNTER — Telehealth: Payer: Self-pay | Admitting: Family Medicine

## 2018-09-23 NOTE — Telephone Encounter (Signed)
Pt called in to cancel appt for Monday. Advised of virtual visit pt is ok with doing, just doesn't want to have to leave her house due to copd and virus. She does not have bp cuff or a way to get her vitals. Please advise.

## 2018-09-23 NOTE — Telephone Encounter (Signed)
Please send BP cuff to [facility]

## 2018-09-23 NOTE — Telephone Encounter (Signed)
I really need a BP on her

## 2018-09-23 NOTE — Telephone Encounter (Signed)
Called pt back she said she will try to get someone to pick up the prescription for her. [facility] [place].

## 2018-09-23 NOTE — Telephone Encounter (Signed)
Scheduled! She said she would rather not have anyone go get it unless she absolutely needs it.

## 2018-09-23 NOTE — Telephone Encounter (Signed)
We will do virtual visit. We can try to get her a BP cuff to her pharmacy for someone to pick up for her if she's OK with that. We'll hold on labs for now.

## 2018-09-24 ENCOUNTER — Other Ambulatory Visit: Payer: Self-pay | Admitting: Unknown Physician Specialty

## 2018-09-24 NOTE — Telephone Encounter (Signed)
Requested medication (s) are due for refill today - yes- expires  Requested medication (s) are on the active medication list -yes  Future visit scheduled -yes  Last refill: 09/15/17  Notes to clinic: Patient requesting refill of expired medication- listed as [place] patient- but seeing Dr Wynetta Emery- sent for review and refill  Requested Prescriptions  Pending Prescriptions Disp Refills   cetirizine (ZYRTEC) 10 MG tablet [Pharmacy Med Name: CETIRIZINE TABS-OTC 10MG ] 30 tablet 11    Sig: TAKE 1 TABLET DAILY     Ear, Nose, and Throat:  Antihistamines Passed - 09/24/2018 10:09 AM      Passed - Valid encounter within last 12 months    Recent Outpatient Visits          5 months ago [place], [place], [place]   6 months ago Encounter for [employer] annual wellness exam   [place], [place], DO   1 year ago Essential hypertension   [place] Kathrine Haddock, NP   1 year ago Chronic obstructive pulmonary disease with acute exacerbation [facility])   [facility] Volney American, PA-C   1 year ago Need for influenza vaccination   [facility] Kathrine Haddock, NP      Future Appointments            In 3 days Wynetta Emery, Barb Merino, DO [facility]            Requested Prescriptions  Pending Prescriptions Disp Refills   cetirizine (ZYRTEC) 10 MG tablet [Pharmacy Med Name: CETIRIZINE TABS-OTC 10MG ] 30 tablet 11    Sig: TAKE 1 TABLET DAILY     Ear, Nose, and Throat:  Antihistamines Passed - 09/24/2018 10:09 AM      Passed - Valid encounter within last 12 months    Recent Outpatient Visits          5 months ago [place], [place], [place]   6 months ago Encounter for [employer] annual wellness exam   [place], [place], DO   1 year ago Essential hypertension   [place] Kathrine Haddock, NP   1 year ago  Chronic obstructive pulmonary disease with acute exacerbation [facility])   [facility] Volney American, PA-C   1 year ago Need for influenza vaccination   [facility] Kathrine Haddock, NP      Future Appointments            In 3 days Wynetta Emery, Barb Merino, DO [employer], [facility]

## 2018-09-27 ENCOUNTER — Telehealth: Payer: Self-pay | Admitting: Unknown Physician Specialty

## 2018-09-27 ENCOUNTER — Other Ambulatory Visit: Payer: Self-pay

## 2018-09-27 ENCOUNTER — Encounter: Payer: Self-pay | Admitting: Family Medicine

## 2018-09-27 ENCOUNTER — Ambulatory Visit (INDEPENDENT_AMBULATORY_CARE_PROVIDER_SITE_OTHER): Payer: Medicare Other | Admitting: Family Medicine

## 2018-09-27 VITALS — BP 115/55 | HR 88 | Temp 98.0°F | Wt 140.0 lb

## 2018-09-27 DIAGNOSIS — J441 Chronic obstructive pulmonary disease with (acute) exacerbation: Secondary | ICD-10-CM

## 2018-09-27 DIAGNOSIS — I129 Hypertensive chronic kidney disease with stage 1 through stage 4 chronic kidney disease, or unspecified chronic kidney disease: Secondary | ICD-10-CM

## 2018-09-27 DIAGNOSIS — E782 Mixed hyperlipidemia: Secondary | ICD-10-CM | POA: Diagnosis not present

## 2018-09-27 MED ORDER — FENOFIBRATE MICRONIZED 130 MG PO CAPS: ORAL_CAPSULE | ORAL | 1 refills | Status: DC

## 2018-09-27 MED ORDER — FLUTICASONE-SALMETEROL 250-50 MCG/DOSE IN AEPB: 1.0000 | INHALATION_SPRAY | Freq: Two times a day (BID) | RESPIRATORY_TRACT | 1 refills | Status: DC

## 2018-09-27 MED ORDER — LISINOPRIL-HYDROCHLOROTHIAZIDE 10-12.5 MG PO TABS: 1.0000 | ORAL_TABLET | Freq: Every day | ORAL | 1 refills | Status: DC

## 2018-09-27 MED ORDER — NAPROXEN 375 MG PO TABS: 375.0000 mg | ORAL_TABLET | Freq: Three times a day (TID) | ORAL | 1 refills | Status: DC

## 2018-09-27 MED ORDER — EZETIMIBE 10 MG PO TABS: 10.0000 mg | ORAL_TABLET | Freq: Every day | ORAL | 1 refills | Status: DC

## 2018-09-27 NOTE — Progress Notes (Signed)
BP (!) 115/55   Pulse 88   Temp 98 F (36.7 C)   Wt 140 lb (63.5 kg)   LMP 11/28/1971 (Approximate)   BMI 26.45 kg/m    Subjective:    Patient ID: Karen Velasquez, female    DOB: 06-13-1930, 85 y.o.   MRN: [id number]  HPI: Karen Velasquez is a 85 y.o. female  Chief Complaint  Patient presents with  . Hypertension  . Hyperlipidemia   HYPERTENSION / HYPERLIPIDEMIA Satisfied with current treatment? yes Duration of hypertension: chronic BP monitoring frequency: not checking BP medication side effects: no Past BP meds: lisinopril-HCTZ Duration of hyperlipidemia: chronic Cholesterol medication side effects: no Cholesterol supplements: none Past cholesterol medications: zetia, fenofibrate Medication compliance: excellent compliance Aspirin: no Recent stressors: no Recurrent headaches: no Visual changes: no Palpitations: no Dyspnea: no Chest pain: no Lower extremity edema: no Dizzy/lightheaded: no  Relevant past medical, surgical, family and social history reviewed and updated as indicated. Interim medical history since our last visit reviewed. Allergies and medications reviewed and updated.  Review of Systems  Constitutional: Negative.   Respiratory: Negative.   Cardiovascular: Negative.   Musculoskeletal: Negative.   Psychiatric/Behavioral: Negative.     Per HPI unless specifically indicated above     Objective:    BP (!) 115/55   Pulse 88   Temp 98 F (36.7 C)   Wt 140 lb (63.5 kg)   LMP 11/28/1971 (Approximate)   BMI 26.45 kg/m   Wt Readings from Last 3 Encounters:  09/27/18 140 lb (63.5 kg)  04/19/18 142 lb 2 oz (64.5 kg)  03/22/18 145 lb 14.4 oz (66.2 kg)    Physical Exam Vitals signs and nursing note reviewed.  Pulmonary:     Effort: Pulmonary effort is normal. No respiratory distress.     Comments: Speaking in full sentences Neurological:     Mental Status: She is alert.  Psychiatric:        Mood and Affect: Mood normal.        Behavior:  Behavior normal.        Thought Content: Thought content normal.        Judgment: Judgment normal.     Results for orders placed or performed in visit on 04/19/18  CBC With Differential/Platelet  Result Value Ref Range   WBC 7.3 3.4 - 10.8 x10E3/uL   RBC 4.23 3.77 - 5.28 x10E6/uL   Hemoglobin 12.9 11.1 - 15.9 g/dL   Hematocrit 38.6 34.0 - 46.6 %   MCV 91 79 - 97 fL   MCH 30.5 26.6 - 33.0 pg   MCHC 33.4 31.5 - 35.7 g/dL   RDW 14.5 12.3 - 15.4 %   Platelets 253 150 - 450 x10E3/uL   Neutrophils 67 Not Estab. %   Lymphs 24 Not Estab. %   MID 10 Not Estab. %   Neutrophils Absolute 4.9 1.4 - 7.0 x10E3/uL   Lymphocytes Absolute 1.7 0.7 - 3.1 x10E3/uL   MID (Absolute) 0.7 0.1 - 1.6 X10E3/uL      Assessment & Plan:   Problem List Items Addressed This Visit      Respiratory   COPD (chronic obstructive pulmonary disease) ([place])    Under good control on current regimen. Continue current regimen. Continue to monitor. Call with any concerns. Refills given.        Relevant Medications   Fluticasone-Salmeterol (WIXELA INHUB) 250-50 MCG/DOSE AEPB     Genitourinary   Benign hypertensive renal disease - Primary    Under good  control on current regimen. Continue current regimen. Continue to monitor. Call with any concerns. Refills given.  Will hold off on labs right now due to the corona virus. Continue to monitor.         Other   Hyperlipidemia    Under good control on current regimen. Continue current regimen. Continue to monitor. Call with any concerns. Refills given.  Will hold off on labs right now due to the corona virus. Continue to monitor.       Relevant Medications   ezetimibe (ZETIA) 10 MG tablet   fenofibrate micronized (ANTARA) 130 MG capsule   lisinopril-hydrochlorothiazide (ZESTORETIC) 10-12.5 MG tablet       Follow up plan: Return in about 6 months (around 03/29/2019) for wellness/follow up.   . This visit was completed via telephone due to the restrictions  of the COVID-19 pandemic. All issues as above were discussed and addressed but no physical exam was performed. If it was felt that the patient should be evaluated in the office, they were directed there. The patient verbally consented to this visit. Patient was unable to complete an audio/visual visit due to Lack of equipment. Due to the catastrophic nature of the COVID-19 pandemic, this visit was done through audio contact only. . Location of the patient: home . Location of the provider: home . Those involved with this call:  . Provider: Park Liter, DO . CMA: Tiffany Reel, CMA . Front Desk/Registration: Don Perking  . Time spent on call: 25 minutes on the phone discussing health concerns. 40 minutes total spent in review of patient's record and preparation of their chart.

## 2018-09-27 NOTE — Assessment & Plan Note (Signed)
Under good control on current regimen. Continue current regimen. Continue to monitor. Call with any concerns. Refills given.   

## 2018-09-27 NOTE — Telephone Encounter (Signed)
I did not send in voltaren gel. It's not on her med list and I did not refill it. I'm not sure what she's talking about. I refilled her naproxen for her.

## 2018-09-27 NOTE — Telephone Encounter (Signed)
Patient just wanted to make sure that it was not sent in, it has been in the past.

## 2018-09-27 NOTE — Assessment & Plan Note (Signed)
Under good control on current regimen. Continue current regimen. Continue to monitor. Call with any concerns. Refills given.  Will hold off on labs right now due to the corona virus. Continue to monitor.

## 2018-09-27 NOTE — Assessment & Plan Note (Deleted)
Under good control on current regimen. Continue current regimen. Continue to monitor. Call with any concerns. Refills given.  Will hold off on labs right now due to the corona virus. Continue to monitor.

## 2018-09-27 NOTE — Telephone Encounter (Signed)
Copied from [place] [id number]. Topic: General - Other >> Sep 27, 2018  8:18 AM Lennox Solders wrote: Reason for CRM: pt is calling and does not want md to order diclofenac sodium gel. Pt talk with dr Wynetta Emery this morning

## 2018-12-31 DIAGNOSIS — H2512 Age-related nuclear cataract, left eye: Secondary | ICD-10-CM | POA: Diagnosis not present

## 2019-01-24 DIAGNOSIS — M5416 Radiculopathy, lumbar region: Secondary | ICD-10-CM | POA: Diagnosis not present

## 2019-01-27 DIAGNOSIS — M5136 Other intervertebral disc degeneration, lumbar region: Secondary | ICD-10-CM | POA: Diagnosis not present

## 2019-01-27 DIAGNOSIS — M5416 Radiculopathy, lumbar region: Secondary | ICD-10-CM | POA: Diagnosis not present

## 2019-01-27 DIAGNOSIS — M48061 Spinal stenosis, lumbar region without neurogenic claudication: Secondary | ICD-10-CM | POA: Diagnosis not present

## 2019-02-18 DIAGNOSIS — Z23 Encounter for immunization: Secondary | ICD-10-CM | POA: Diagnosis not present

## 2019-03-08 ENCOUNTER — Telehealth: Payer: Self-pay | Admitting: Family Medicine

## 2019-03-08 NOTE — Telephone Encounter (Signed)
DECLINED AWV 

## 2019-03-28 ENCOUNTER — Other Ambulatory Visit: Payer: Self-pay

## 2019-03-28 ENCOUNTER — Encounter: Payer: Self-pay | Admitting: Family Medicine

## 2019-03-28 ENCOUNTER — Ambulatory Visit (INDEPENDENT_AMBULATORY_CARE_PROVIDER_SITE_OTHER): Payer: Medicare Other | Admitting: Family Medicine

## 2019-03-28 VITALS — BP 145/77 | HR 84 | Temp 98.1°F | Ht 61.0 in | Wt 147.0 lb

## 2019-03-28 DIAGNOSIS — S41101A Unspecified open wound of right upper arm, initial encounter: Secondary | ICD-10-CM

## 2019-03-28 DIAGNOSIS — E782 Mixed hyperlipidemia: Secondary | ICD-10-CM | POA: Diagnosis not present

## 2019-03-28 DIAGNOSIS — N951 Menopausal and female climacteric states: Secondary | ICD-10-CM

## 2019-03-28 DIAGNOSIS — Z0000 Encounter for general adult medical examination without abnormal findings: Secondary | ICD-10-CM

## 2019-03-28 DIAGNOSIS — Z23 Encounter for immunization: Secondary | ICD-10-CM | POA: Diagnosis not present

## 2019-03-28 DIAGNOSIS — N1831 Chronic kidney disease, stage 3a: Secondary | ICD-10-CM | POA: Diagnosis not present

## 2019-03-28 DIAGNOSIS — I129 Hypertensive chronic kidney disease with stage 1 through stage 4 chronic kidney disease, or unspecified chronic kidney disease: Secondary | ICD-10-CM | POA: Diagnosis not present

## 2019-03-28 DIAGNOSIS — J441 Chronic obstructive pulmonary disease with (acute) exacerbation: Secondary | ICD-10-CM | POA: Diagnosis not present

## 2019-03-28 LAB — UA/M W/RFLX CULTURE, ROUTINE
Bilirubin, UA: NEGATIVE
Glucose, UA: NEGATIVE
Ketones, UA: NEGATIVE
Leukocytes,UA: NEGATIVE
Nitrite, UA: NEGATIVE
Protein,UA: NEGATIVE
RBC, UA: NEGATIVE
Specific Gravity, UA: 1.025 (ref 1.005–1.030)
Urobilinogen, Ur: 0.2 mg/dL (ref 0.2–1.0)
pH, UA: 6 (ref 5.0–7.5)

## 2019-03-28 LAB — MICROALBUMIN, URINE WAIVED
Creatinine, Urine Waived: 100 mg/dL (ref 10–300)
Microalb, Ur Waived: 10 mg/L (ref 0–19)
Microalb/Creat Ratio: <30 mg/g (ref <30)

## 2019-03-28 MED ORDER — ESOMEPRAZOLE MAGNESIUM 40 MG PO CPDR: DELAYED_RELEASE_CAPSULE | ORAL | 1 refills | Status: DC

## 2019-03-28 MED ORDER — FENOFIBRATE MICRONIZED 130 MG PO CAPS: ORAL_CAPSULE | ORAL | 1 refills | Status: DC

## 2019-03-28 MED ORDER — ESTRADIOL 0.5 MG PO TABS: 0.5000 mg | ORAL_TABLET | Freq: Every day | ORAL | 6 refills | Status: DC

## 2019-03-28 MED ORDER — EZETIMIBE 10 MG PO TABS: 10.0000 mg | ORAL_TABLET | Freq: Every day | ORAL | 1 refills | Status: DC

## 2019-03-28 MED ORDER — FLUTICASONE PROPIONATE 50 MCG/ACT NA SUSP: 2.0000 | Freq: Two times a day (BID) | NASAL | 12 refills | Status: DC

## 2019-03-28 MED ORDER — LISINOPRIL-HYDROCHLOROTHIAZIDE 10-12.5 MG PO TABS: 1.0000 | ORAL_TABLET | Freq: Every day | ORAL | 1 refills | Status: DC

## 2019-03-28 MED ORDER — CETIRIZINE HCL 10 MG PO TABS: 10.0000 mg | ORAL_TABLET | Freq: Every day | ORAL | 1 refills | Status: DC

## 2019-03-28 MED ORDER — FLUTICASONE-SALMETEROL 250-50 MCG/DOSE IN AEPB: 1.0000 | INHALATION_SPRAY | Freq: Two times a day (BID) | RESPIRATORY_TRACT | 1 refills | Status: DC

## 2019-03-28 NOTE — Patient Instructions (Signed)
Health Maintenance After Age 83 After age 83, you are at a higher risk for certain long-term diseases and infections as well as injuries from falls. Falls are a major cause of broken bones and head injuries in people who are older than age 83. Getting regular preventive care can help to keep you healthy and well. Preventive care includes getting regular testing and making lifestyle changes as recommended by your health care provider. Talk with your health care provider about:  Which screenings and tests you should have. A screening is a test that checks for a disease when you have no symptoms.  A diet and exercise plan that is right for you. What should I know about screenings and tests to prevent falls? Screening and testing are the best ways to find a health problem early. Early diagnosis and treatment give you the best chance of managing medical conditions that are common after age 83. Certain conditions and lifestyle choices may make you more likely to have a fall. Your health care provider may recommend:  Regular vision checks. Poor vision and conditions such as cataracts can make you more likely to have a fall. If you wear glasses, make sure to get your prescription updated if your vision changes.  Medicine review. Work with your health care provider to regularly review all of the medicines you are taking, including over-the-counter medicines. Ask your health care provider about any side effects that may make you more likely to have a fall. Tell your health care provider if any medicines that you take make you feel dizzy or sleepy.  Osteoporosis screening. Osteoporosis is a condition that causes the bones to get weaker. This can make the bones weak and cause them to break more easily.  Blood pressure screening. Blood pressure changes and medicines to control blood pressure can make you feel dizzy.  Strength and balance checks. Your health care provider may recommend certain tests to check your  strength and balance while standing, walking, or changing positions.  Foot health exam. Foot pain and numbness, as well as not wearing proper footwear, can make you more likely to have a fall.  Depression screening. You may be more likely to have a fall if you have a fear of falling, feel emotionally low, or feel unable to do activities that you used to do.  Alcohol use screening. Using too much alcohol can affect your balance and may make you more likely to have a fall. What actions can I take to lower my risk of falls? General instructions  Talk with your health care provider about your risks for falling. Tell your health care provider if: ? You fall. Be sure to tell your health care provider about all falls, even ones that seem minor. ? You feel dizzy, sleepy, or off-balance.  Take over-the-counter and prescription medicines only as told by your health care provider. These include any supplements.  Eat a healthy diet and maintain a healthy weight. A healthy diet includes low-fat dairy products, low-fat (lean) meats, and fiber from whole grains, beans, and lots of fruits and vegetables. Home safety  Remove any tripping hazards, such as rugs, cords, and clutter.  Install safety equipment such as grab bars in bathrooms and safety rails on stairs.  Keep rooms and walkways well-lit. Activity   Follow a regular exercise program to stay fit. This will help you maintain your balance. Ask your health care provider what types of exercise are appropriate for you.  If you need a cane or   walker, use it as recommended by your health care provider.  Wear supportive shoes that have nonskid soles. Lifestyle  Do not drink alcohol if your health care provider tells you not to drink.  If you drink alcohol, limit how much you have: ? 0-1 drink a day for women. ? 0-2 drinks a day for men.  Be aware of how much alcohol is in your drink. In the [place] one drink equals one typical bottle of beer (12  oz), one-half glass of wine (5 oz), or one shot of hard liquor (1 oz).  Do not use any products that contain nicotine or tobacco, such as cigarettes and e-cigarettes. If you need help quitting, ask your health care provider. Summary  Having a healthy lifestyle and getting preventive care can help to protect your health and wellness after age 46.  Screening and testing are the best way to find a health problem early and help you avoid having a fall. Early diagnosis and treatment give you the best chance for managing medical conditions that are more common for people who are older than age 10.  Falls are a major cause of broken bones and head injuries in people who are older than age 1. Take precautions to prevent a fall at home.  Work with your health care provider to learn what changes you can make to improve your health and wellness and to prevent falls. This information is not intended to replace advice given to you by your health care provider. Make sure you discuss any questions you have with your health care provider. Document Released: 04/08/2017 Document Revised: 09/16/2018 Document Reviewed: 04/08/2017 Elsevier Patient Education  [place] (Tetanus and Diphtheria): What You Need to Know 1. Why get vaccinated? Tetanus  and diphtheria are very serious diseases. They are rare in the [place] today, but people who do become infected often have severe complications. Td vaccine is used to protect adolescents and adults from both of these diseases. Both tetanus and diphtheria are infections caused by bacteria. Diphtheria spreads from person to person through coughing or sneezing. Tetanus-causing bacteria enter the body through cuts, scratches, or wounds. TETANUS (Lockjaw) causes painful muscle tightening and stiffness, usually all over the body.  It can lead to tightening of muscles in the head and neck so you can't open your mouth, swallow, or sometimes even  breathe. Tetanus kills about 1 out of every 10 people who are infected even after receiving the best medical care. DIPHTHERIA can cause a thick coating to form in the back of the throat.  It can lead to breathing problems, paralysis, heart failure, and death. Before vaccines, as many as 200,000 cases of diphtheria and hundreds of cases of tetanus were reported in the [place] each year. Since vaccination began, reports of cases for both diseases have dropped by about 99%. 2. Td vaccine Td vaccine can protect adolescents and adults from tetanus and diphtheria. Td is usually given as a booster dose every 10 years but it can also be given earlier after a severe and dirty wound or burn. Another vaccine, called Tdap, which protects against pertussis in addition to tetanus and diphtheria, is sometimes recommended instead of Td vaccine. Your doctor or the person giving you the vaccine can give you more information. Td may safely be given at the same time as other vaccines. 3. Some people should not get this vaccine  A person who has ever had a life-threatening allergic reaction after a previous dose  of any tetanus or diphtheria containing vaccine, OR has a severe allergy to any part of this vaccine, should not get Td vaccine. Tell the person giving the vaccine about any severe allergies.  Talk to your doctor if you: ? had severe pain or swelling after any vaccine containing diphtheria or tetanus, ? ever had a condition called Guillain Barr Syndrome (GBS), ? aren't feeling well on the day the shot is scheduled. 4. Risks of a vaccine reaction With any medicine, including vaccines, there is a chance of side effects. These are usually mild and go away on their own. Serious reactions are also possible but are rare. Most people who get Td vaccine do not have any problems with it. Mild Problems following Td vaccine: (Did not interfere with activities)  Pain where the shot was given (about 8 people in  10)  Redness or swelling where the shot was given (about 1 person in 4)  Mild fever (rare)  Headache (about 1 person in 4)  Tiredness (about 1 person in 4) Moderate Problems following Td vaccine: (Interfered with activities, but did not require medical attention)  Fever over 102F (rare) Severe Problems following Td vaccine: (Unable to perform usual activities; required medical attention)  Swelling, severe pain, bleeding and/or redness in the arm where the shot was given (rare). Problems that could happen after any vaccine:  People sometimes faint after a medical procedure, including vaccination. Sitting or lying down for about 15 minutes can help prevent fainting, and injuries caused by a fall. Tell your doctor if you feel dizzy, or have vision changes or ringing in the ears.  Some people get severe pain in the shoulder and have difficulty moving the arm where a shot was given. This happens very rarely.  Any medication can cause a severe allergic reaction. Such reactions from a vaccine are very rare, estimated at fewer than 1 in a million doses, and would happen within a few minutes to a few hours after the vaccination. As with any medicine, there is a very remote chance of a vaccine causing a serious injury or death. The safety of vaccines is always being monitored. For more information, visit: http://www.aguilar.org/ 5. What if there is a serious reaction? What should I look for?  Look for anything that concerns you, such as signs of a severe allergic reaction, very high fever, or unusual behavior. Signs of a severe allergic reaction can include hives, swelling of the face and throat, difficulty breathing, a fast heartbeat, dizziness, and weakness. These would usually start a few minutes to a few hours after the vaccination. What should I do?  If you think it is a severe allergic reaction or other emergency that can't wait, call 9-1-1 or get the person to the nearest hospital.  Otherwise, call your doctor.  Afterward, the reaction should be reported to the Vaccine Adverse Event Reporting System (VAERS). Your doctor might file this report, or you can do it yourself through the VAERS web site at www.vaers.SamedayNews.es, or by calling 279-563-1098. VAERS does not give medical advice. 6. The National Vaccine Injury Compensation Program The [employer] Vaccine Injury Compensation Program (VICP) is a federal program that was created to compensate people who may have been injured by certain vaccines. Persons who believe they may have been injured by a vaccine can learn about the program and about filing a claim by calling (218) 671-3856 or visiting the [place] website at GoldCloset.com.ee. There is a time limit to file a claim for compensation. 7. How can  I learn more?  Ask your doctor. He or she can give you the vaccine package insert or suggest other sources of information.  Call your local or state health department.  Contact the Centers for Disease Control and Prevention (CDC): ? Call (517)617-1346 (1-800-CDC-INFO) ? Visit CDC's website at http://hunter.com/ Vaccine Information Statement Td Vaccine (09/18/15) This information is not intended to replace advice given to you by your health care provider. Make sure you discuss any questions you have with your health care provider. Document Released: 03/23/2006 Document Revised: 01/11/2018 Document Reviewed: 01/11/2018 Elsevier Interactive Patient Education  2020 [place]. Influenza (Flu) Vaccine (Inactivated or Recombinant): What You Need to Know 1. Why get vaccinated? Influenza vaccine can prevent influenza (flu). Flu is a contagious disease that spreads around the [place] every year, usually between October and May. Anyone can get the flu, but it is more dangerous for some people. Infants and young children, people 41 years of age and older, pregnant women, and people with certain health conditions or a  weakened immune system are at greatest risk of flu complications. Pneumonia, bronchitis, sinus infections and ear infections are examples of flu-related complications. If you have a medical condition, such as heart disease, cancer or diabetes, flu can make it worse. Flu can cause fever and chills, sore throat, muscle aches, fatigue, cough, headache, and runny or stuffy nose. Some people may have vomiting and diarrhea, though this is more common in children than adults. Each year thousands of people in the [place] die from flu, and many more are hospitalized. Flu vaccine prevents millions of illnesses and flu-related visits to the doctor each year. 2. Influenza vaccine CDC recommends everyone 26 months of age and older get vaccinated every flu season. Children 6 months through 43 years of age may need 2 doses during a single flu season. Everyone else needs only 1 dose each flu season. It takes about 2 weeks for protection to develop after vaccination. There are many flu viruses, and they are always changing. Each year a new flu vaccine is made to protect against three or four viruses that are likely to cause disease in the upcoming flu season. Even when the vaccine doesn't exactly match these viruses, it may still provide some protection. Influenza vaccine does not cause flu. Influenza vaccine may be given at the same time as other vaccines. 3. Talk with your health care provider Tell your vaccine provider if the person getting the vaccine:  Has had an allergic reaction after a previous dose of influenza vaccine, or has any severe, life-threatening allergies.  Has ever had Guillain-Barr Syndrome (also called GBS). In some cases, your health care provider may decide to postpone influenza vaccination to a future visit. People with minor illnesses, such as a cold, may be vaccinated. People who are moderately or severely ill should usually wait until they recover before getting influenza vaccine.  Your health care provider can give you more information. 4. Risks of a vaccine reaction  Soreness, redness, and swelling where shot is given, fever, muscle aches, and headache can happen after influenza vaccine.  There may be a very small increased risk of Guillain-Barr Syndrome (GBS) after inactivated influenza vaccine (the flu shot). Young children who get the flu shot along with pneumococcal vaccine (PCV13), and/or DTaP vaccine at the same time might be slightly more likely to have a seizure caused by fever. Tell your health care provider if a child who is getting flu vaccine has ever had a seizure. People  sometimes faint after medical procedures, including vaccination. Tell your provider if you feel dizzy or have vision changes or ringing in the ears. As with any medicine, there is a very remote chance of a vaccine causing a severe allergic reaction, other serious injury, or death. 5. What if there is a serious problem? An allergic reaction could occur after the vaccinated person leaves the clinic. If you see signs of a severe allergic reaction (hives, swelling of the face and throat, difficulty breathing, a fast heartbeat, dizziness, or weakness), call 9-1-1 and get the person to the nearest hospital. For other signs that concern you, call your health care provider. Adverse reactions should be reported to the Vaccine Adverse Event Reporting System (VAERS). Your health care provider will usually file this report, or you can do it yourself. Visit the VAERS website at www.vaers.SamedayNews.es or call 504-129-9479.VAERS is only for reporting reactions, and VAERS staff do not give medical advice. 6. The National Vaccine Injury Compensation Program The [employer] Vaccine Injury Compensation Program (VICP) is a federal program that was created to compensate people who may have been injured by certain vaccines. Visit the VICP website at GoldCloset.com.ee or call 814-739-6490 to learn about the  program and about filing a claim. There is a time limit to file a claim for compensation. 7. How can I learn more?  Ask your healthcare provider.  Call your local or state health department.  Contact the Centers for Disease Control and Prevention (CDC): ? Call (650)641-0943 (1-800-CDC-INFO) or ? Visit CDC's https://gibson.com/ Vaccine Information Statement (Interim) Inactivated Influenza Vaccine (01/21/2018) This information is not intended to replace advice given to you by your health care provider. Make sure you discuss any questions you have with your health care provider. Document Released: 03/20/2006 Document Revised: 09/14/2018 Document Reviewed: 01/25/2018 Elsevier Patient Education  2020 [employer].

## 2019-03-28 NOTE — Assessment & Plan Note (Signed)
Not under good control. Would like to restart estrace. Aware of risks. Will restart and check in in 1 month. Call with any concerns.

## 2019-03-28 NOTE — Assessment & Plan Note (Signed)
Under fair control on current regimen. Continue current regimen. Continue to monitor. Call with any concerns. Refills given. Labs drawn today. 

## 2019-03-28 NOTE — Progress Notes (Signed)
BP (!) 145/77   Pulse 84   Temp 98.1 F (36.7 C) (Oral)   Ht 5\' 1"  (1.549 m)   Wt 147 lb (66.7 kg)   LMP 11/28/1971 (Approximate)   SpO2 98%   BMI 27.78 kg/m    Subjective:    Patient ID: Karen Velasquez, female    DOB: 1931-04-01, 85 y.o.   MRN: [id number]  HPI: Karen Velasquez is a 85 y.o. female presenting on 03/28/2019 for comprehensive medical examination. Current medical complaints include:  HYPERTENSION / HYPERLIPIDEMIA Satisfied with current treatment? yes Duration of hypertension: chronic BP monitoring frequency: not checking BP medication side effects: no Past BP meds: lisinopril-HCTZ Duration of hyperlipidemia: chronic Cholesterol medication side effects: no Cholesterol supplements: none Past cholesterol medications: zetia Medication compliance: excellent compliance Aspirin: no Recent stressors: yes Recurrent headaches: no Visual changes: no Palpitations: no Dyspnea: no Chest pain: no Lower extremity edema: no Dizzy/lightheaded: no  COPD COPD status: controlled Satisfied with current treatment?: yes Oxygen use: no Dyspnea frequency: never Cough frequency: occasionally Rescue inhaler frequency:  none Limitation of activity: no Productive cough: no Pneumovax: Up to Date Influenza: Up to Date  Menopausal Symptoms: yes  Functional Status Survey: Is the patient deaf or have difficulty hearing?: No Does the patient have difficulty seeing, even when wearing glasses/contacts?: No Does the patient have difficulty concentrating, remembering, or making decisions?: No Does the patient have difficulty walking or climbing stairs?: No Does the patient have difficulty dressing or bathing?: No Does the patient have difficulty doing errands alone such as visiting a doctor's office or shopping?: No  Fall Risk  03/28/2019 09/27/2018 04/29/2018 03/22/2018 02/24/2017  Falls in the past year? 0 0 0 No No  Comment - - Emmi Telephone Survey: data to providers prior to load  - -  Number falls in past yr: 0 0 - - -  Injury with Fall? 0 0 - - -    Depression Screen Depression screen [facility] 2/9 03/28/2019 03/22/2018 02/24/2017 02/25/2016 12/29/2014  Decreased Interest 0 0 0 0 0  Down, Depressed, Hopeless 0 0 0 0 0  PHQ - 2 Score 0 0 0 0 0  Altered sleeping 0 - 0 - -  Tired, decreased energy 0 - 1 - -  Change in appetite 0 - 0 - -  Feeling bad or failure about yourself  0 - 0 - -  Trouble concentrating 0 - 0 - -  Moving slowly or fidgety/restless 0 - 0 - -  Suicidal thoughts 0 - 0 - -  PHQ-9 Score 0 - 1 - -   Advanced Directives Does patient have a HCPOA?    yes Does patient have a living will or MOST form?  yes  Past Medical History:  Past Medical History:  Diagnosis Date  . Hyperlipidemia   . Hypertension    Surgical History:  Past Surgical History:  Procedure Laterality Date  . ABDOMINAL HYSTERECTOMY    . APPENDECTOMY     2105  . cornea implant     3,4 yrs ago  . PARTIAL HYSTERECTOMY     75yrs of age  . ROTATOR CUFF REPAIR     28yr ago  . TONSILLECTOMY     as a child  . X-STOP IMPLANTATION N/A    60yrs ago    Medications:  Current Outpatient Medications on File Prior to Visit  Medication Sig  . Calcium Carbonate-Vitamin D 600-400 MG-UNIT per tablet Take 1 tablet by mouth daily.   Marland Kitchen EXTRA STRENGTH  ACETAMINOPHEN PO Take 1 tablet by mouth every 6 (six) hours as needed.  . hydrocortisone (ANUSOL-HC) 2.5 % rectal cream Place 1 application rectally 2 (two) times daily.  . naproxen (NAPROSYN) 375 MG tablet Take 1 tablet (375 mg total) by mouth 3 (three) times daily with meals.  . vitamin C (ASCORBIC ACID) 500 MG tablet Take by mouth.  . naftifine (NAFTIN) 1 % cream Apply 1 application topically as needed. (Patient not taking: Reported on 04/19/2018)   No current facility-administered medications on file prior to visit.     Allergies:  Allergies  Allergen Reactions  . Augmentin [Amoxicillin-Pot Clavulanate] Nausea And Vomiting    Social  History:  Social History   Socioeconomic History  . Marital status: Widowed    Spouse name: Not on file  . Number of children: 1  . Years of education: Not on file  . Highest education level: Not on file  Occupational History  . Occupation: retired  Scientific laboratory technician  . Financial resource strain: Not on file  . Food insecurity    Worry: Not on file    Inability: Not on file  . Transportation needs    Medical: Not on file    Non-medical: Not on file  Tobacco Use  . Smoking status: Former Smoker    Packs/day: 0.50    Types: Cigarettes    Quit date: 08/25/2016    Years since quitting: 2.5  . Smokeless tobacco: Never Used  Substance and Sexual Activity  . Alcohol use: No    Alcohol/week: 0.0 standard drinks  . Drug use: No  . Sexual activity: Not Currently  Lifestyle  . Physical activity    Days per week: Not on file    Minutes per session: Not on file  . Stress: Not on file  Relationships  . Social Herbalist on phone: Not on file    Gets together: Not on file    Attends religious service: Not on file    Active member of club or organization: Not on file    Attends meetings of clubs or organizations: Not on file    Relationship status: Not on file  . Intimate partner violence    Fear of current or ex partner: Not on file    Emotionally abused: Not on file    Physically abused: Not on file    Forced sexual activity: Not on file  Other Topics Concern  . Not on file  Social History Narrative  . Not on file   Social History   Tobacco Use  Smoking Status Former Smoker  . Packs/day: 0.50  . Types: Cigarettes  . Quit date: 08/25/2016  . Years since quitting: 2.5  Smokeless Tobacco Never Used   Social History   Substance and Sexual Activity  Alcohol Use No  . Alcohol/week: 0.0 standard drinks    Family History:  Family History  Problem Relation Age of Onset  . Diabetes Father   . Cancer Father   . Cancer Sister     Past medical history, surgical  history, medications, allergies, family history and social history reviewed with patient today and changes made to appropriate areas of the chart.   Review of Systems  Constitutional: Positive for diaphoresis. Negative for chills, fever, malaise/fatigue and weight loss.  HENT: Negative.   Eyes: Negative.   Respiratory: Negative.   Cardiovascular: Negative.   Gastrointestinal: Positive for constipation. Negative for abdominal pain, blood in stool, diarrhea, heartburn, melena, nausea and vomiting.  Genitourinary: Negative.   Musculoskeletal: Positive for back pain and myalgias. Negative for falls, joint pain and neck pain.  Skin: Negative.   Neurological: Negative.   Endo/Heme/Allergies: Negative for environmental allergies and polydipsia. Bruises/bleeds easily.  Psychiatric/Behavioral: Negative.     All other ROS negative except what is listed above and in the HPI.      Objective:    BP (!) 145/77   Pulse 84   Temp 98.1 F (36.7 C) (Oral)   Ht 5\' 1"  (1.549 m)   Wt 147 lb (66.7 kg)   LMP 11/28/1971 (Approximate)   SpO2 98%   BMI 27.78 kg/m   Wt Readings from Last 3 Encounters:  03/28/19 147 lb (66.7 kg)  09/27/18 140 lb (63.5 kg)  04/19/18 142 lb 2 oz (64.5 kg)    Physical Exam Vitals signs and nursing note reviewed.  Constitutional:      General: She is not in acute distress.    Appearance: Normal appearance. She is not ill-appearing, toxic-appearing or diaphoretic.  HENT:     Head: Normocephalic and atraumatic.     Right Ear: Tympanic membrane, ear canal and external ear normal. There is no impacted cerumen.     Left Ear: Tympanic membrane, ear canal and external ear normal. There is no impacted cerumen.     Nose: Nose normal. No congestion or rhinorrhea.     Mouth/Throat:     Mouth: Mucous membranes are moist.     Pharynx: Oropharynx is clear. No oropharyngeal exudate or posterior oropharyngeal erythema.  Eyes:     General: No scleral icterus.       Right eye: No  discharge.        Left eye: No discharge.     Extraocular Movements: Extraocular movements intact.     Conjunctiva/sclera: Conjunctivae normal.     Pupils: Pupils are equal, round, and reactive to light.  Neck:     Musculoskeletal: Normal range of motion and neck supple. No neck rigidity or muscular tenderness.     Vascular: No carotid bruit.  Cardiovascular:     Rate and Rhythm: Normal rate and regular rhythm.     Pulses: Normal pulses.     Heart sounds: No murmur. No friction rub. No gallop.   Pulmonary:     Effort: Pulmonary effort is normal. No respiratory distress.     Breath sounds: Normal breath sounds. No stridor. No wheezing, rhonchi or rales.  Chest:     Chest wall: No tenderness.  Abdominal:     General: Abdomen is flat. Bowel sounds are normal. There is no distension.     Palpations: Abdomen is soft. There is no mass.     Tenderness: There is no abdominal tenderness. There is no right CVA tenderness, left CVA tenderness, guarding or rebound.     Hernia: No hernia is present.  Genitourinary:    Comments: Breast and pelvic exams deferred with shared decision making Musculoskeletal:        General: No swelling, tenderness, deformity or signs of injury.     Right lower leg: No edema.     Left lower leg: No edema.  Lymphadenopathy:     Cervical: No cervical adenopathy.  Skin:    General: Skin is warm and dry.     Capillary Refill: Capillary refill takes less than 2 seconds.     Coloration: Skin is not jaundiced or pale.     Findings: No bruising, erythema, lesion or rash.     Comments: Small  open wound R arm consistent with bug bite  Neurological:     General: No focal deficit present.     Mental Status: She is alert and oriented to person, place, and time. Mental status is at baseline.     Cranial Nerves: No cranial nerve deficit.     Sensory: No sensory deficit.     Motor: No weakness.     Coordination: Coordination normal.     Gait: Gait normal.     Deep Tendon  Reflexes: Reflexes normal.  Psychiatric:        Mood and Affect: Mood normal.        Behavior: Behavior normal.        Thought Content: Thought content normal.        Judgment: Judgment normal.     6CIT Screen 03/28/2019 03/22/2018  What Year? 0 points 0 points  What month? 0 points 0 points  What time? 0 points 0 points  Count back from 20 0 points 0 points  Months in reverse 0 points 0 points  Repeat phrase 0 points 0 points  Total Score 0 0    Results for orders placed or performed in visit on 04/19/18  CBC With Differential/Platelet  Result Value Ref Range   WBC 7.3 3.4 - 10.8 x10E3/uL   RBC 4.23 3.77 - 5.28 x10E6/uL   Hemoglobin 12.9 11.1 - 15.9 g/dL   Hematocrit 38.6 34.0 - 46.6 %   MCV 91 79 - 97 fL   MCH 30.5 26.6 - 33.0 pg   MCHC 33.4 31.5 - 35.7 g/dL   RDW 14.5 12.3 - 15.4 %   Platelets 253 150 - 450 x10E3/uL   Neutrophils 67 Not Estab. %   Lymphs 24 Not Estab. %   MID 10 Not Estab. %   Neutrophils Absolute 4.9 1.4 - 7.0 x10E3/uL   Lymphocytes Absolute 1.7 0.7 - 3.1 x10E3/uL   MID (Absolute) 0.7 0.1 - 1.6 X10E3/uL      Assessment & Plan:   Problem List Items Addressed This Visit      Respiratory   COPD (chronic obstructive pulmonary disease) ([place])    Under good control on current regimen. Continue current regimen. Continue to monitor. Call with any concerns. Refills given. Labs drawn today.        Relevant Medications   cetirizine (ZYRTEC) 10 MG tablet   fluticasone (FLONASE) 50 MCG/ACT nasal spray   Fluticasone-Salmeterol (WIXELA INHUB) 250-50 MCG/DOSE AEPB   Other Relevant Orders   CBC with Differential/Platelet   Comprehensive metabolic panel   UA/M w/rflx Culture, Routine     Genitourinary   Benign hypertensive renal disease    Under fair control on current regimen. Continue current regimen. Continue to monitor. Call with any concerns. Refills given. Labs drawn today.        Relevant Orders   CBC with Differential/Platelet    Comprehensive metabolic panel   Microalbumin, Urine Waived   TSH   UA/M w/rflx Culture, Routine   CKD (chronic kidney disease) stage 3, GFR 30-59 ml/min    Rechecking levels today. Await results. Treat as needed. Call with any concerns.       Relevant Orders   CBC with Differential/Platelet   Comprehensive metabolic panel   Microalbumin, Urine Waived   UA/M w/rflx Culture, Routine     Other   Hot flashes, menopausal    Not under good control. Would like to restart estrace. Aware of risks. Will restart and check in in 1 month. Call  with any concerns.       Hyperlipidemia    Under good control on current regimen. Continue current regimen. Continue to monitor. Call with any concerns. Refills given. Labs drawn today.        Relevant Medications   ezetimibe (ZETIA) 10 MG tablet   fenofibrate micronized (ANTARA) 130 MG capsule   lisinopril-hydrochlorothiazide (ZESTORETIC) 10-12.5 MG tablet   Other Relevant Orders   CBC with Differential/Platelet   Comprehensive metabolic panel   Lipid Panel w/o Chol/HDL Ratio   UA/M w/rflx Culture, Routine    Other Visit Diagnoses    Encounter for Medicare annual wellness exam    -  Primary   Preventative care discussed today as below.    Relevant Orders   UA/M w/rflx Culture, Routine   Open wound of right upper arm, initial encounter       Healing well. Due for Td- given today.   Relevant Orders   Td : Tetanus/diphtheria >7yo Preservative  free (Completed)   CBC with Differential/Platelet   Comprehensive metabolic panel   UA/M w/rflx Culture, Routine       Preventative Services:  Health Risk Assessment and Personalized Prevention Plan: Done today Bone Mass Measurements: Declined Breast Cancer Screening: N/A CVD Screening: Done today Cervical Cancer Screening: N/A Colon Cancer Screening: N/A Depression Screening: Done today Diabetes Screening: Done today Glaucoma Screening: See your eye doctor Hepatitis B vaccine: N/A Hepatitis C  screening: N/A HIV Screening: N/A Flu Vaccine: up to date Lung cancer Screening: N/A Obesity Screening: Done today Pneumonia Vaccines (2): up to date STI Screening: N/A  Follow up plan: Return in about 4 weeks (around 04/25/2019) for follow up hot flashes.   LABORATORY TESTING:  - Pap smear: not applicable  IMMUNIZATIONS:   - Tdap: Tetanus vaccination status reviewed: Td vaccination indicated and given today. - Influenza: Administered today - Pneumovax: Up to date - Prevnar: Up to date  SCREENING: -Mammogram: Not applicable  - Colonoscopy: Not applicable  - Bone Density: Not applicable   PATIENT COUNSELING:   Advised to take 1 mg of folate supplement per day if capable of pregnancy.   Sexuality: Discussed sexually transmitted diseases, partner selection, use of condoms, avoidance of unintended pregnancy  and contraceptive alternatives.   Advised to avoid cigarette smoking.  I discussed with the patient that most people either abstain from alcohol or drink within safe limits (<=14/week and <=4 drinks/occasion for males, <=7/weeks and <= 3 drinks/occasion for females) and that the risk for alcohol disorders and other health effects rises proportionally with the number of drinks per week and how often a drinker exceeds daily limits.  Discussed cessation/primary prevention of drug use and availability of treatment for abuse.   Diet: Encouraged to adjust caloric intake to maintain  or achieve ideal body weight, to reduce intake of dietary saturated fat and total fat, to limit sodium intake by avoiding high sodium foods and not adding table salt, and to maintain adequate dietary potassium and calcium preferably from fresh fruits, vegetables, and low-fat dairy products.    stressed the importance of regular exercise  Injury prevention: Discussed safety belts, safety helmets, smoke detector, smoking near bedding or upholstery.   Dental health: Discussed importance of regular tooth  brushing, flossing, and dental visits.    NEXT PREVENTATIVE PHYSICAL DUE IN 1 YEAR. Return in about 4 weeks (around 04/25/2019) for follow up hot flashes.

## 2019-03-28 NOTE — Assessment & Plan Note (Signed)
Under good control on current regimen. Continue current regimen. Continue to monitor. Call with any concerns. Refills given. Labs drawn today.   

## 2019-03-28 NOTE — Assessment & Plan Note (Signed)
Rechecking levels today. Await results. Treat as needed. Call with any concerns.  

## 2019-03-29 ENCOUNTER — Encounter: Payer: Self-pay | Admitting: Family Medicine

## 2019-03-29 LAB — CBC WITH DIFFERENTIAL/PLATELET
Basophils Absolute: 0 10*3/uL (ref 0.0–0.2)
Basos: 1 %
EOS (ABSOLUTE): 0.1 10*3/uL (ref 0.0–0.4)
Eos: 1 %
Hematocrit: 35.7 % (ref 34.0–46.6)
Hemoglobin: 11.8 g/dL (ref 11.1–15.9)
Immature Grans (Abs): 0 10*3/uL (ref 0.0–0.1)
Immature Granulocytes: 0 %
Lymphocytes Absolute: 1.2 10*3/uL (ref 0.7–3.1)
Lymphs: 20 %
MCH: 29.5 pg (ref 26.6–33.0)
MCHC: 33.1 g/dL (ref 31.5–35.7)
MCV: 89 fL (ref 79–97)
Monocytes Absolute: 0.4 10*3/uL (ref 0.1–0.9)
Monocytes: 8 %
Neutrophils Absolute: 3.9 10*3/uL (ref 1.4–7.0)
Neutrophils: 70 %
Platelets: 213 10*3/uL (ref 150–450)
RBC: 4 x10E6/uL (ref 3.77–5.28)
RDW: 13.5 % (ref 11.7–15.4)
WBC: 5.7 10*3/uL (ref 3.4–10.8)

## 2019-03-29 LAB — COMPREHENSIVE METABOLIC PANEL
ALT: 19 IU/L (ref 0–32)
AST: 17 IU/L (ref 0–40)
Albumin/Globulin Ratio: 2 (ref 1.2–2.2)
Albumin: 4.3 g/dL (ref 3.6–4.6)
Alkaline Phosphatase: 45 IU/L (ref 39–117)
BUN/Creatinine Ratio: 27 (ref 12–28)
BUN: 30 mg/dL — ABNORMAL HIGH (ref 8–27)
Bilirubin Total: 0.4 mg/dL (ref 0.0–1.2)
CO2: 23 mmol/L (ref 20–29)
Calcium: 9.6 mg/dL (ref 8.7–10.3)
Chloride: 105 mmol/L (ref 96–106)
Creatinine, Ser: 1.1 mg/dL — ABNORMAL HIGH (ref 0.57–1.00)
GFR calc Af Amer: 53 mL/min/{1.73_m2} — ABNORMAL LOW (ref >59)
GFR calc non Af Amer: 46 mL/min/{1.73_m2} — ABNORMAL LOW (ref >59)
Globulin, Total: 2.2 g/dL (ref 1.5–4.5)
Glucose: 94 mg/dL (ref 65–99)
Potassium: 4.5 mmol/L (ref 3.5–5.2)
Sodium: 142 mmol/L (ref 134–144)
Total Protein: 6.5 g/dL (ref 6.0–8.5)

## 2019-03-29 LAB — LIPID PANEL W/O CHOL/HDL RATIO
Cholesterol, Total: 152 mg/dL (ref 100–199)
HDL: 61 mg/dL (ref >39)
LDL Chol Calc (NIH): 77 mg/dL (ref 0–99)
Triglycerides: 74 mg/dL (ref 0–149)
VLDL Cholesterol Cal: 14 mg/dL (ref 5–40)

## 2019-03-29 LAB — TSH: TSH: 0.556 u[IU]/mL (ref 0.450–4.500)

## 2019-04-20 ENCOUNTER — Other Ambulatory Visit: Payer: Self-pay

## 2019-04-20 DIAGNOSIS — Z20822 Contact with and (suspected) exposure to covid-19: Secondary | ICD-10-CM

## 2019-04-23 ENCOUNTER — Telehealth: Payer: Self-pay

## 2019-04-23 NOTE — Telephone Encounter (Signed)
Pt called for test results. Advised results are not back.

## 2019-04-23 NOTE — Telephone Encounter (Signed)
Pt called for test results. Advised results are not back yet.

## 2019-04-24 NOTE — Telephone Encounter (Signed)
Patient calling for test results. Pt advised that results are still pending at this time.

## 2019-04-25 NOTE — Telephone Encounter (Signed)
Patient  Called again for COVID results ,and was told the results are still pending.

## 2019-04-28 ENCOUNTER — Ambulatory Visit: Payer: Medicare Other | Admitting: Family Medicine

## 2019-04-29 ENCOUNTER — Other Ambulatory Visit: Payer: Self-pay

## 2019-04-29 ENCOUNTER — Encounter: Payer: Self-pay | Admitting: Intensive Care

## 2019-04-29 ENCOUNTER — Ambulatory Visit (INDEPENDENT_AMBULATORY_CARE_PROVIDER_SITE_OTHER): Payer: Medicare Other | Admitting: Nurse Practitioner

## 2019-04-29 ENCOUNTER — Emergency Department
Admission: EM | Admit: 2019-04-29 | Discharge: 2019-04-29 | Disposition: A | Discharge status: Home / Self Care | Payer: Medicare Other | Attending: Student | Admitting: Student

## 2019-04-29 ENCOUNTER — Emergency Department: Payer: Medicare Other

## 2019-04-29 ENCOUNTER — Encounter: Payer: Self-pay | Admitting: Nurse Practitioner

## 2019-04-29 DIAGNOSIS — I129 Hypertensive chronic kidney disease with stage 1 through stage 4 chronic kidney disease, or unspecified chronic kidney disease: Secondary | ICD-10-CM | POA: Diagnosis not present

## 2019-04-29 DIAGNOSIS — U071 COVID-19: Secondary | ICD-10-CM | POA: Insufficient documentation

## 2019-04-29 DIAGNOSIS — J449 Chronic obstructive pulmonary disease, unspecified: Secondary | ICD-10-CM | POA: Insufficient documentation

## 2019-04-29 DIAGNOSIS — R05 Cough: Secondary | ICD-10-CM | POA: Diagnosis not present

## 2019-04-29 DIAGNOSIS — Z20828 Contact with and (suspected) exposure to other viral communicable diseases: Secondary | ICD-10-CM | POA: Insufficient documentation

## 2019-04-29 DIAGNOSIS — R195 Other fecal abnormalities: Secondary | ICD-10-CM

## 2019-04-29 DIAGNOSIS — R52 Pain, unspecified: Secondary | ICD-10-CM

## 2019-04-29 DIAGNOSIS — R059 Cough, unspecified: Secondary | ICD-10-CM

## 2019-04-29 DIAGNOSIS — Z79899 Other long term (current) drug therapy: Secondary | ICD-10-CM | POA: Diagnosis not present

## 2019-04-29 DIAGNOSIS — Z87891 Personal history of nicotine dependence: Secondary | ICD-10-CM | POA: Diagnosis not present

## 2019-04-29 DIAGNOSIS — Z20822 Contact with and (suspected) exposure to covid-19: Secondary | ICD-10-CM | POA: Insufficient documentation

## 2019-04-29 DIAGNOSIS — N183 Chronic kidney disease, stage 3 unspecified: Secondary | ICD-10-CM | POA: Diagnosis not present

## 2019-04-29 HISTORY — DX: Chronic obstructive pulmonary disease, unspecified: J44.9

## 2019-04-29 LAB — CBC
HCT: 34.7 % — ABNORMAL LOW (ref 36.0–46.0)
Hemoglobin: 11.5 g/dL — ABNORMAL LOW (ref 12.0–15.0)
MCH: 28.8 pg (ref 26.0–34.0)
MCHC: 33.1 g/dL (ref 30.0–36.0)
MCV: 87 fL (ref 80.0–100.0)
Platelets: 223 10*3/uL (ref 150–400)
RBC: 3.99 MIL/uL (ref 3.87–5.11)
RDW: 14.3 % (ref 11.5–15.5)
WBC: 3.5 10*3/uL — ABNORMAL LOW (ref 4.0–10.5)
nRBC: 0 % (ref 0.0–0.2)

## 2019-04-29 LAB — COMPREHENSIVE METABOLIC PANEL
ALT: 24 U/L (ref 0–44)
AST: 34 U/L (ref 15–41)
Albumin: 3.6 g/dL (ref 3.5–5.0)
Alkaline Phosphatase: 43 U/L (ref 38–126)
Anion gap: 13 (ref 5–15)
BUN: 23 mg/dL (ref 8–23)
CO2: 22 mmol/L (ref 22–32)
Calcium: 8.9 mg/dL (ref 8.9–10.3)
Chloride: 106 mmol/L (ref 98–111)
Creatinine, Ser: 0.86 mg/dL (ref 0.44–1.00)
GFR calc Af Amer: >60 mL/min (ref >60)
GFR calc non Af Amer: >60 mL/min (ref >60)
Glucose, Bld: 110 mg/dL — ABNORMAL HIGH (ref 70–99)
Potassium: 4 mmol/L (ref 3.5–5.1)
Sodium: 141 mmol/L (ref 135–145)
Total Bilirubin: 0.6 mg/dL (ref 0.3–1.2)
Total Protein: 7.5 g/dL (ref 6.5–8.1)

## 2019-04-29 LAB — LIPASE, BLOOD: Lipase: 33 U/L (ref 11–51)

## 2019-04-29 MED ORDER — BENZONATATE 100 MG PO CAPS: 100.0000 mg | ORAL_CAPSULE | Freq: Three times a day (TID) | ORAL | 0 refills | Status: DC | PRN

## 2019-04-29 MED ORDER — AZITHROMYCIN 250 MG PO TABS: ORAL_TABLET | ORAL | 0 refills | Status: AC

## 2019-04-29 MED ORDER — ALBUTEROL SULFATE HFA 108 (90 BASE) MCG/ACT IN AERS: 4.0000 | INHALATION_SPRAY | Freq: Four times a day (QID) | RESPIRATORY_TRACT | 0 refills | Status: DC | PRN

## 2019-04-29 MED ORDER — SPACER/AERO-HOLDING CHAMBERS DEVI: 0 refills | Status: DC

## 2019-04-29 MED ORDER — PREDNISONE 20 MG PO TABS: 40.0000 mg | ORAL_TABLET | Freq: Every day | ORAL | 0 refills | Status: AC

## 2019-04-29 NOTE — ED Triage Notes (Addendum)
Patient c/o cough, body aches, and diarrhea X1 week. Frequent cough present in triage. Daughter was diagnosed with covid who patient has been around

## 2019-04-29 NOTE — Patient Instructions (Signed)

## 2019-04-29 NOTE — ED Provider Notes (Signed)
[facility] Emergency Department Provider Note  ____________________________________________   First MD Initiated Contact with Patient 04/29/19 1247     (approximate)  I have reviewed the triage vital signs and the nursing notes.  History  Chief Complaint Cough, Generalized Body Aches, and Diarrhea    HPI Karen Velasquez is a 85 y.o. female with history of COPD, CKD, HTN, HLD who presents emergency department for suspected COVID.  Patient reports non-productive cough, body aches, sore throat for approximately 1 week.  She also developed multiple episodes of loose stool today, which have now resolved. No fevers.  Patient has a known COVID exposure, she has been around her daughter who tested positive earlier this month, tested positive on 11/4.   Past Medical Hx Past Medical History:  Diagnosis Date  . COPD (chronic obstructive pulmonary disease) ([place])   . Hyperlipidemia   . Hypertension     Problem List Patient Active Problem List   Diagnosis Date Noted  . Close exposure to COVID-19 virus 04/29/2019  . Degeneration of lumbar intervertebral disc 02/04/2018  . CKD (chronic kidney disease) stage 3, GFR 30-59 ml/min 09/15/2017  . Cervical pain (neck) 09/15/2017  . Advanced care planning/counseling discussion 02/24/2017  . Bilateral leg cramps 02/24/2017  . Word finding difficulty 02/25/2016  . Allergic rhinitis 09/20/2015  . COPD (chronic obstructive pulmonary disease) ([place]) 08/23/2015  . Benign hypertensive renal disease 11/22/2014  . GERD (gastroesophageal reflux disease) 11/22/2014  . Hyperlipidemia 11/22/2014  . Osteoarthritis 11/22/2014  . Nicotine dependence 11/22/2014  . Hx of spinal stenosis 11/22/2014    Past Surgical Hx Past Surgical History:  Procedure Laterality Date  . ABDOMINAL HYSTERECTOMY    . APPENDECTOMY     2105  . cornea implant     3,4 yrs ago  . PARTIAL HYSTERECTOMY     51yrs of age  . ROTATOR CUFF REPAIR     73yr ago   . TONSILLECTOMY     as a child  . X-STOP IMPLANTATION N/A    89yrs ago    Medications Prior to Admission medications   Medication Sig Start Date End Date Taking? Authorizing Provider  Calcium Carbonate-Vitamin D 600-400 MG-UNIT per tablet Take 1 tablet by mouth daily.     [provider]  cetirizine (ZYRTEC) 10 MG tablet Take 1 tablet (10 mg total) by mouth daily. 03/28/19   Johnson, Megan P, DO  esomeprazole (NEXIUM) 40 MG capsule TAKE 1 CAPSULE DAILY AT 12 NOON 03/28/19   Johnson, Megan P, DO  estradiol (ESTRACE) 0.5 MG tablet Take 1 tablet (0.5 mg total) by mouth daily. 03/28/19   Johnson, Megan P, DO  EXTRA STRENGTH ACETAMINOPHEN PO Take 1 tablet by mouth every 6 (six) hours as needed.    [provider]  ezetimibe (ZETIA) 10 MG tablet Take 1 tablet (10 mg total) by mouth daily. 03/28/19   Johnson, Megan P, DO  fenofibrate micronized (ANTARA) 130 MG capsule TAKE 1 CAPSULE DAILY BEFORE BREAKFAST 03/28/19   Johnson, Megan P, DO  fluticasone (FLONASE) 50 MCG/ACT nasal spray Place 2 sprays into both nostrils 2 (two) times daily. 03/28/19   Johnson, Megan P, DO  Fluticasone-Salmeterol (WIXELA INHUB) 250-50 MCG/DOSE AEPB Inhale 1 puff into the lungs 2 (two) times daily. 03/28/19   Johnson, Megan P, DO  hydrocortisone (ANUSOL-HC) 2.5 % rectal cream Place 1 application rectally 2 (two) times daily. 04/19/18   Volney American, PA-C  lisinopril-hydrochlorothiazide (ZESTORETIC) 10-12.5 MG tablet Take 1 tablet by mouth daily.  03/28/19   Johnson, Megan P, DO  naftifine (NAFTIN) 1 % cream Apply 1 application topically as needed. 02/24/17   Kathrine Haddock, NP  naproxen (NAPROSYN) 375 MG tablet Take 1 tablet (375 mg total) by mouth 3 (three) times daily with meals. 09/27/18   Johnson, Megan P, DO  vitamin C (ASCORBIC ACID) 500 MG tablet Take by mouth.    [provider]    Allergies Augmentin [amoxicillin-pot clavulanate]  Family Hx Family History  Problem Relation  Age of Onset  . Diabetes Father   . Cancer Father   . Cancer Sister     Social Hx Social History   Tobacco Use  . Smoking status: Former Smoker    Packs/day: 0.50    Types: Cigarettes    Quit date: 08/25/2016    Years since quitting: 2.6  . Smokeless tobacco: Never Used  Substance Use Topics  . Alcohol use: No    Alcohol/week: 0.0 standard drinks  . Drug use: No     Review of Systems  Constitutional: Negative for fever, chills. + body aches Eyes: Negative for visual changes. ENT: + for sore throat. Cardiovascular: Negative for chest pain. Respiratory: + for shortness of breath and cough. Gastrointestinal: Negative for nausea, vomiting. + diarrhea Genitourinary: Negative for dysuria. Musculoskeletal: Negative for leg swelling. Skin: Negative for rash. Neurological: Negative for for headaches.   Physical Exam  Vital Signs: ED Triage Vitals  Enc Vitals Group     BP 04/29/19 1101 138/63     Pulse Rate 04/29/19 1101 97     Resp 04/29/19 1101 16     Temp 04/29/19 1101 98.5 F (36.9 C)     Temp Source 04/29/19 1101 Oral     SpO2 04/29/19 1101 95 %     Weight 04/29/19 1101 145 lb (65.8 kg)     Height 04/29/19 1101 5\' 2"  (1.575 m)     Head Circumference --      Peak Flow --      Pain Score 04/29/19 1121 5     Pain Loc --      Pain Edu? --      Excl. in [place]? --     Constitutional: Alert and oriented.  Head: Normocephalic. Atraumatic. Eyes: Conjunctivae clear. Sclera anicteric. Nose: No congestion. No rhinorrhea. Mouth/Throat: Wearing mask.  Neck: No stridor.   Cardiovascular: Normal rate, regular rhythm. Extremities well perfused. Respiratory: Normal respiratory effort. Normal RR. Frequent dry cough during exam. Able to speak in full sentences.  Ambulatory without becoming dyspneic. Gastrointestinal: Soft. Non-tender. Non-distended.  Musculoskeletal: No lower extremity edema. No deformities. Neurologic:  Normal speech and language. No gross focal neurologic  deficits are appreciated.  Skin: Skin is warm, dry and intact. No rash noted. Psychiatric: Mood and affect are appropriate for situation.  EKG  N/A    Radiology  XR: IMPRESSION:  Mild bilateral ill-defined opacities are noted concerning for  multifocal pneumonia, which may be viral in etiology.    Procedures  Procedure(s) performed (including critical care):  Procedures   Initial Impression / Assessment and Plan / ED Course  85 y.o. female who presents to the ED with cough, body aches, sore throat, diarrhea, suspected COVID with known exposure earlier this month.   On exam, she has a frequent, dry cough, but is able to speak in full sentences, no accessory muscle use, and ambulatory without becoming dyspneic.  HDS.  Presentation highly concerning for COVID. XR consistent. Other labs w/o actionable derangements. Ambulatory pulse ox  actually improved with walking from 93-95% to 99%.   As such, patient is appropriate for discharge.  Given her concomitant COPD, will prescribe steroids, azithromycin, albuterol with spacer, and Tessalon Perles for cough.  Discussed plan of care with the patient's daughter via phone, who voices understanding and is comfortable with the plan.  She understands that results will take 1 to 2 days.  Given return precautions.   Final Clinical Impression(s) / ED Diagnosis  Final diagnoses:  Suspected COVID-19 virus infection  Cough  Body aches  Loose stools       Note:  This document was prepared using Dragon voice recognition software and may include unintentional dictation errors.   Lilia Pro., MD 04/29/19 1415

## 2019-04-29 NOTE — Discharge Instructions (Addendum)
Thank you for letting us take care of you in the emergency department.   At this time, your coronavirus swab results are pending. You should hear your results in 1-2 days if they are positive.   In the meantime, it is important to take precautions in case you are positive. This includes quarantining, wearing a mask, and social distancing.   Please return to the ER for any new or worsening symptoms, such as difficulty breathing, vomiting and diarrhea, or chest pain.   New medicines we have prescribed for you: - Prednisone - a steroid - Azithromycin - an antibiotic  - Albuterol inhaler - take as needed for SOB and wheezing - A spacer to help with the inhaler - Tessalon - cough medication

## 2019-04-29 NOTE — Progress Notes (Signed)
LMP 11/28/1971 (Approximate)    Subjective:    Patient ID: Karen Velasquez, female    DOB: 10/12/1930, 85 y.o.   MRN: [id number]  HPI: Karen Velasquez is a 85 y.o. female  Chief Complaint  Patient presents with  . URI    pt states she was exposed to [place] on 04/18/19. States she has started having diarrhea, vomiting, sore throat, body aches, and a cough    . This visit was completed via telephone due to the restrictions of the COVID-19 pandemic. All issues as above were discussed and addressed but no physical exam was performed. If it was felt that the patient should be evaluated in the office, they were directed there. The patient verbally consented to this visit. Patient was unable to complete an audio/visual visit due to Lack of equipment. Due to the catastrophic nature of the COVID-19 pandemic, this visit was done through audio contact only. . Location of the patient: home . Location of the provider: home . Those involved with this call:  . Provider: Marnee Guarneri, DNP . CMA: Yvonna Alanis, CMA . Front Desk/Registration: Jill Side  . Time spent on call: 15 minutes on the phone discussing health concerns. 10 minutes total spent in review of patient's record and preparation of their chart.  . I verified patient identity using two factors (patient name and date of birth). Patient consents verbally to being seen via telemedicine visit today.    COVID EXPOSURE She was exposed to Covid on 04/18/2019, her daughter exposed her (daughter is positive).  Patient reports she aches all over and has intermittent diarrhea + has "terrible cough" and is throwing up.  Symptoms started on the 11th.  Does have underlying COPD and uses inhaler, reports right now she can not use inhalers as she can not take deep breath in due to the cough and some SOB which recently started.  On chart review testing performed 04/20/2019, still showing active and not resulted, but patient reports she had nothing placed  in her nose ---- they took her information but her daughter reports that she thinks patient drove off and did not get test due to long wait times.  Denies fever, CP, or diaphoresis.  Does report some loss of taste. Current risk factors: COPD, smoker, CKD, HLD, advanced age Fever: no Cough: yes Shortness of breath: yes Wheezing: yes Chest pain: no Chest tightness: no Chest congestion: no Nasal congestion: no Runny nose: no Post nasal drip: no Sneezing: no Sore throat: no Swollen glands: no Sinus pressure: no Headache: no Face pain: no Toothache: no Ear pain: none Ear pressure: none Eyes red/itching:no Eye drainage/crusting: no  Vomiting: yes Rash: no Fatigue: yes Sick contacts: yes Strep contacts: no  Context: worse Recurrent sinusitis: no Relief with OTC cold/cough medications: no  Treatments attempted: cough syrup   Relevant past medical, surgical, family and social history reviewed and updated as indicated. Interim medical history since our last visit reviewed. Allergies and medications reviewed and updated.  Review of Systems  Constitutional: Positive for fatigue. Negative for activity change, appetite change, chills, diaphoresis and fever.  HENT: Negative for congestion, ear discharge, ear pain, facial swelling, postnasal drip, rhinorrhea, sinus pressure, sinus pain, sneezing, sore throat and voice change.   Eyes: Negative for pain and visual disturbance.  Respiratory: Positive for cough, chest tightness, shortness of breath and wheezing.   Cardiovascular: Negative for chest pain, palpitations and leg swelling.  Gastrointestinal: Positive for diarrhea, nausea and vomiting. Negative for abdominal distention, abdominal pain  and constipation.  Endocrine: Negative.   Musculoskeletal: Positive for myalgias.  Neurological: Negative for dizziness, numbness and headaches.  Psychiatric/Behavioral: Negative.     Per HPI unless specifically indicated above     Objective:     LMP 11/28/1971 (Approximate)   Wt Readings from Last 3 Encounters:  03/28/19 147 lb (66.7 kg)  09/27/18 140 lb (63.5 kg)  04/19/18 142 lb 2 oz (64.5 kg)    Physical Exam   Unable to perform exam due to telephone, however patient with productive, coarse cough noted during exam and some SOB with talking.  Results for orders placed or performed in visit on 03/28/19  CBC with Differential/Platelet  Result Value Ref Range   WBC 5.7 3.4 - 10.8 x10E3/uL   RBC 4.00 3.77 - 5.28 x10E6/uL   Hemoglobin 11.8 11.1 - 15.9 g/dL   Hematocrit 35.7 34.0 - 46.6 %   MCV 89 79 - 97 fL   MCH 29.5 26.6 - 33.0 pg   MCHC 33.1 31.5 - 35.7 g/dL   RDW 13.5 11.7 - 15.4 %   Platelets 213 150 - 450 x10E3/uL   Neutrophils 70 Not Estab. %   Lymphs 20 Not Estab. %   Monocytes 8 Not Estab. %   Eos 1 Not Estab. %   Basos 1 Not Estab. %   Neutrophils Absolute 3.9 1.4 - 7.0 x10E3/uL   Lymphocytes Absolute 1.2 0.7 - 3.1 x10E3/uL   Monocytes Absolute 0.4 0.1 - 0.9 x10E3/uL   EOS (ABSOLUTE) 0.1 0.0 - 0.4 x10E3/uL   Basophils Absolute 0.0 0.0 - 0.2 x10E3/uL   Immature Granulocytes 0 Not Estab. %   Immature Grans (Abs) 0.0 0.0 - 0.1 x10E3/uL  Comprehensive metabolic panel  Result Value Ref Range   Glucose 94 65 - 99 mg/dL   BUN 30 (H) 8 - 27 mg/dL   Creatinine, Ser 1.10 (H) 0.57 - 1.00 mg/dL   GFR calc non Af Amer 46 (L) >59 mL/min/1.73   GFR calc Af Amer 53 (L) >59 mL/min/1.73   BUN/Creatinine Ratio 27 12 - 28   Sodium 142 134 - 144 mmol/L   Potassium 4.5 3.5 - 5.2 mmol/L   Chloride 105 96 - 106 mmol/L   CO2 23 20 - 29 mmol/L   Calcium 9.6 8.7 - 10.3 mg/dL   Total Protein 6.5 6.0 - 8.5 g/dL   Albumin 4.3 3.6 - 4.6 g/dL   Globulin, Total 2.2 1.5 - 4.5 g/dL   Albumin/Globulin Ratio 2.0 1.2 - 2.2   Bilirubin Total 0.4 0.0 - 1.2 mg/dL   Alkaline Phosphatase 45 39 - 117 IU/L   AST 17 0 - 40 IU/L   ALT 19 0 - 32 IU/L  Lipid Panel w/o Chol/HDL Ratio  Result Value Ref Range   Cholesterol, Total 152 100 -  199 mg/dL   Triglycerides 74 0 - 149 mg/dL   HDL 61 >39 mg/dL   VLDL Cholesterol Cal 14 5 - 40 mg/dL   LDL Chol Calc (NIH) 77 0 - 99 mg/dL  Microalbumin, Urine Waived  Result Value Ref Range   Microalb, Ur Waived 10 0 - 19 mg/L   Creatinine, Urine Waived 100 10 - 300 mg/dL   Microalb/Creat Ratio <30 <30 mg/g  TSH  Result Value Ref Range   TSH 0.556 0.450 - 4.500 uIU/mL  UA/M w/rflx Culture, Routine   Specimen: Urine   URINE  Result Value Ref Range   Specific Gravity, UA 1.025 1.005 - 1.030   pH, UA  6.0 5.0 - 7.5   Color, UA Yellow Yellow   Appearance Ur Clear Clear   Leukocytes,UA Negative Negative   Protein,UA Negative Negative/Trace   Glucose, UA Negative Negative   Ketones, UA Negative Negative   RBC, UA Negative Negative   Bilirubin, UA Negative Negative   Urobilinogen, Ur 0.2 0.2 - 1.0 mg/dL   Nitrite, UA Negative Negative      Assessment & Plan:   Problem List Items Addressed This Visit      Other   Close exposure to COVID-19 virus    High suspicion for Covid in this patient with direct exposure from daughter who tested positive.  Patient with multiple risk factors for decompensation to include advanced age, COPD, CKD, smoker, HLD.  Have recommended to her daughter and her that they take her into nearest ER or UC today for further evaluation due to ongoing symptoms with some worsening SOB and cough.  High risk for viral PNA and would benefit from imaging and labs.  Patient and daughter were able to verbalize this plan back to provider and will be going for further evaluation immediately.           I discussed the assessment and treatment plan with the patient. The patient was provided an opportunity to ask questions and all were answered. The patient agreed with the plan and demonstrated an understanding of the instructions.   The patient was advised to call back or seek an in-person evaluation if the symptoms worsen or if the condition fails to improve as  anticipated.   I provided 15 minutes of time during this encounter.  Follow up plan: Return in about 4 weeks (around 05/27/2019) for Covid follow-up, sooner if no admission and patient sent home with treatment.

## 2019-04-29 NOTE — ED Notes (Signed)
See triage note  Presents with body aches diarrhea and fever  States her sx's started about 2 weeks ago  States she was tested as an outpt   But never did get results  States she has been around her daughter that was positive for COVID

## 2019-04-29 NOTE — Assessment & Plan Note (Signed)
High suspicion for Covid in this patient with direct exposure from daughter who tested positive.  Patient with multiple risk factors for decompensation to include advanced age, COPD, CKD, smoker, HLD.  Have recommended to her daughter and her that they take her into nearest ER or UC today for further evaluation due to ongoing symptoms with some worsening SOB and cough.  High risk for viral PNA and would benefit from imaging and labs.  Patient and daughter were able to verbalize this plan back to provider and will be going for further evaluation immediately.

## 2019-05-02 ENCOUNTER — Telehealth: Payer: Self-pay | Admitting: Emergency Medicine

## 2019-05-02 LAB — NOVEL CORONAVIRUS, NAA (HOSP ORDER, SEND-OUT TO REF LAB; TAT 18-24 HRS): SARS-CoV-2, NAA: DETECTED — AB

## 2019-05-02 NOTE — Telephone Encounter (Addendum)
Called patient to inform of positive covid 19 result.  Left message.  Patient called me back and I gave her results, quarantine and isolation guidelines.

## 2019-05-12 ENCOUNTER — Other Ambulatory Visit: Payer: Self-pay

## 2019-05-12 ENCOUNTER — Encounter: Payer: Self-pay | Admitting: Nurse Practitioner

## 2019-05-12 ENCOUNTER — Ambulatory Visit (INDEPENDENT_AMBULATORY_CARE_PROVIDER_SITE_OTHER): Payer: Medicare Other | Admitting: Nurse Practitioner

## 2019-05-12 DIAGNOSIS — U071 COVID-19: Secondary | ICD-10-CM | POA: Insufficient documentation

## 2019-05-12 DIAGNOSIS — J1289 Other viral pneumonia: Secondary | ICD-10-CM | POA: Diagnosis not present

## 2019-05-12 DIAGNOSIS — J1282 Pneumonia due to coronavirus disease 2019: Secondary | ICD-10-CM | POA: Insufficient documentation

## 2019-05-12 NOTE — Progress Notes (Signed)
LMP 11/28/1971 (Approximate)    Subjective:    Patient ID: Karen Velasquez, female    DOB: 07-25-1930, 85 y.o.   MRN: [id number]  HPI: Karen Velasquez is a 85 y.o. female  Chief Complaint  Patient presents with  . Follow-up    COVID follow up    . This visit was completed via telephone due to the restrictions of the COVID-19 pandemic. All issues as above were discussed and addressed but no physical exam was performed. If it was felt that the patient should be evaluated in the office, they were directed there. The patient verbally consented to this visit. Patient was unable to complete an audio/visual visit due to Lack of equipment. Due to the catastrophic nature of the COVID-19 pandemic, this visit was done through audio contact only. . Location of the patient: home . Location of the provider: work . Those involved with this call:  . Provider: Marnee Guarneri, DNP . CMA: Yvonna Alanis, CMA . Front Desk/Registration: Jill Side  . Time spent on call: 15 minutes on the phone discussing health concerns. 10 minutes total spent in review of patient's record and preparation of their chart.  . I verified patient identity using two factors (patient name and date of birth). Patient consents verbally to being seen via telemedicine visit today.   COVID POSITIVE Reports cough has improved, but is very tired and has myalgias.  Tested positive originally on 04/29/2019 and has been self quarantining.  She was seen in ER on 04/29/2019, as her daughter had tested positive and patient had a cough, imaging at that time did note "Mild bilateral ill-defined opacities are noted concerning for multifocal pneumonia, which may be viral in etiology".  She was given Zpack, Prednisone, Tessalon, and Albuterol.  She has now completed Zpack and Prednisone, reports overall improvement in cough.  Discussed at length plan of care with her.  She is aware of need to repeat CXR at end of month to reassess.   Fever: no  Cough: improved Shortness of breath: no Wheezing: no Chest pain: no Chest tightness: no Chest congestion: no Nasal congestion: no Runny nose: no Post nasal drip: no Sneezing: no Sore throat: no Swollen glands: no Sinus pressure: no Headache: no Face pain: no Toothache: no Ear pain: no none Ear pressure: none Eyes red/itching:no Eye drainage/crusting: no  Vomiting: no Rash: no Fatigue:yes Sick contacts: daughter Context: better  Relevant past medical, surgical, family and social history reviewed and updated as indicated. Interim medical history since our last visit reviewed. Allergies and medications reviewed and updated.  Review of Systems  Constitutional: Positive for fatigue. Negative for activity change, appetite change, chills, diaphoresis and fever.  HENT: Negative for congestion, ear discharge, ear pain, facial swelling, postnasal drip, rhinorrhea, sinus pressure, sinus pain, sneezing, sore throat and voice change.   Eyes: Negative for pain and visual disturbance.  Respiratory: Negative for cough, chest tightness, shortness of breath and wheezing.   Cardiovascular: Negative for chest pain, palpitations and leg swelling.  Gastrointestinal: Negative for abdominal distention, abdominal pain, constipation, diarrhea, nausea and vomiting.  Endocrine: Negative.   Musculoskeletal: Positive for myalgias.  Neurological: Negative for dizziness, numbness and headaches.  Psychiatric/Behavioral: Negative.     Per HPI unless specifically indicated above     Objective:    LMP 11/28/1971 (Approximate)   Wt Readings from Last 3 Encounters:  04/29/19 140 lb (63.5 kg)  03/28/19 147 lb (66.7 kg)  09/27/18 140 lb (63.5 kg)    Physical Exam  Unable to perform due to virtual visit only, although on this visit no coughing present during exam period.  Results for orders placed or performed during the hospital encounter of 04/29/19  Novel Coronavirus, NAA (Hosp order, Send-out  to Ref Lab; TAT 18-24 hrs   Specimen: Nasopharyngeal Swab; Respiratory  Result Value Ref Range   SARS-CoV-2, NAA DETECTED (A) NOT DETECTED   Coronavirus Source NASOPHARYNGEAL   Lipase, blood  Result Value Ref Range   Lipase 33 11 - 51 U/L  Comprehensive metabolic panel  Result Value Ref Range   Sodium 141 135 - 145 mmol/L   Potassium 4.0 3.5 - 5.1 mmol/L   Chloride 106 98 - 111 mmol/L   CO2 22 22 - 32 mmol/L   Glucose, Bld 110 (H) 70 - 99 mg/dL   BUN 23 8 - 23 mg/dL   Creatinine, Ser 0.86 0.44 - 1.00 mg/dL   Calcium 8.9 8.9 - 10.3 mg/dL   Total Protein 7.5 6.5 - 8.1 g/dL   Albumin 3.6 3.5 - 5.0 g/dL   AST 34 15 - 41 U/L   ALT 24 0 - 44 U/L   Alkaline Phosphatase 43 38 - 126 U/L   Total Bilirubin 0.6 0.3 - 1.2 mg/dL   GFR calc non Af Amer >60 >60 mL/min   GFR calc Af Amer >60 >60 mL/min   Anion gap 13 5 - 15  CBC  Result Value Ref Range   WBC 3.5 (L) 4.0 - 10.5 K/uL   RBC 3.99 3.87 - 5.11 MIL/uL   Hemoglobin 11.5 (L) 12.0 - 15.0 g/dL   HCT 34.7 (L) 36.0 - 46.0 %   MCV 87.0 80.0 - 100.0 fL   MCH 28.8 26.0 - 34.0 pg   MCHC 33.1 30.0 - 36.0 g/dL   RDW 14.3 11.5 - 15.5 %   Platelets 223 150 - 400 K/uL   nRBC 0.0 0.0 - 0.2 %      Assessment & Plan:   Problem List Items Addressed This Visit      Respiratory   Pneumonia due to COVID-19 virus    Acute and improving with outpatient care.  Has completed Zpack and Prednisone course.  Tomorrow will be day 14 of quarantine.  She is aware of quarantine instructions.  Continue to wear mask when out in public and good hand washing.  Will have her return on 06/06/19 for office visit with lung check and repeat CXR.           I discussed the assessment and treatment plan with the patient. The patient was provided an opportunity to ask questions and all were answered. The patient agreed with the plan and demonstrated an understanding of the instructions.   The patient was advised to call back or seek an in-person evaluation if the  symptoms worsen or if the condition fails to improve as anticipated.   I provided 15 minutes of time during this encounter.  Follow up plan: Return in about 25 days (around 06/06/2019) for Covid f/u in office for lung check.

## 2019-05-12 NOTE — Patient Instructions (Signed)

## 2019-05-12 NOTE — Assessment & Plan Note (Signed)
Acute and improving with outpatient care.  Has completed Zpack and Prednisone course.  Tomorrow will be day 14 of quarantine.  She is aware of quarantine instructions.  Continue to wear mask when out in public and good hand washing.  Will have her return on 06/06/19 for office visit with lung check and repeat CXR.

## 2019-05-19 DIAGNOSIS — H2512 Age-related nuclear cataract, left eye: Secondary | ICD-10-CM | POA: Diagnosis not present

## 2019-05-19 DIAGNOSIS — H353130 Nonexudative age-related macular degeneration, bilateral, stage unspecified: Secondary | ICD-10-CM | POA: Diagnosis not present

## 2019-05-27 ENCOUNTER — Ambulatory Visit: Payer: Medicare Other | Admitting: Nurse Practitioner

## 2019-06-06 ENCOUNTER — Ambulatory Visit: Payer: Medicare Other | Admitting: Nurse Practitioner

## 2019-06-16 ENCOUNTER — Ambulatory Visit: Payer: Medicare Other | Admitting: Nurse Practitioner

## 2019-07-01 ENCOUNTER — Encounter: Payer: Self-pay | Admitting: Nurse Practitioner

## 2019-07-01 ENCOUNTER — Other Ambulatory Visit: Payer: Self-pay

## 2019-07-01 ENCOUNTER — Ambulatory Visit (INDEPENDENT_AMBULATORY_CARE_PROVIDER_SITE_OTHER): Payer: Medicare Other | Admitting: Nurse Practitioner

## 2019-07-01 VITALS — BP 122/68 | HR 91 | Temp 98.1°F

## 2019-07-01 DIAGNOSIS — J1282 Pneumonia due to coronavirus disease 2019: Secondary | ICD-10-CM

## 2019-07-01 DIAGNOSIS — Z87891 Personal history of nicotine dependence: Secondary | ICD-10-CM

## 2019-07-01 DIAGNOSIS — J410 Simple chronic bronchitis: Secondary | ICD-10-CM | POA: Diagnosis not present

## 2019-07-01 DIAGNOSIS — U071 COVID-19: Secondary | ICD-10-CM

## 2019-07-01 NOTE — Assessment & Plan Note (Signed)
Improving at this time.  Will repeat CXR and continue to monitor.  She has received her initial Covid vaccine per her report, via health department.

## 2019-07-01 NOTE — Progress Notes (Signed)
BP 122/68 (BP Location: Left Arm)   Pulse 91   Temp 98.1 F (36.7 C) (Oral)   LMP 11/28/1971 (Approximate)   SpO2 97%    Subjective:    Patient ID: Karen Velasquez, female    DOB: 1931-02-06, 86 y.o.   MRN: [id number]  HPI: Karen Velasquez is a 86 y.o. female  Chief Complaint  Patient presents with  . Follow-up    lung check   COVID POSITIVE -- PNEUMONIA Tested positive originally on 04/29/2019 and has been self quarantining.  She was seen in ER on 04/29/2019, as her daughter had tested positive and patient had a cough, imaging at that time did note "Mild bilateral ill-defined opacities are noted concerning for multifocal pneumonia, which may be viral in etiology".  She was given Zpack, Prednisone, Tessalon, and Albuterol.  At this time reports overall feeling better.  CBC in ER noted WBC 3.5 and H/H 11.5/34.7. Fever: no Cough: none Shortness of breath: no Wheezing: no Chest pain: no Chest tightness: no Chest congestion: no Nasal congestion: no Runny nose: no Post nasal drip: no Sneezing: no Sore throat: no Swollen glands: no Sinus pressure: no Headache: no Face pain: no Toothache: no Ear pain: no none Ear pressure: none Eyes red/itching:no Eye drainage/crusting: no  Vomiting: no  COPD Continues to use Wixela daily and Albuterol as needed.  Was a past smoker for 60 some years and quit 2 1/2 years ago.   COPD status: stable Satisfied with current treatment?: yes Oxygen use: no Dyspnea frequency: none Cough frequency: none Rescue inhaler frequency: has not used in long while Limitation of activity: no Productive cough: none Last Spirometry: 2017 -- FEV1 77% Pneumovax: Up to Date Influenza: Up to Date   Relevant past medical, surgical, family and social history reviewed and updated as indicated. Interim medical history since our last visit reviewed. Allergies and medications reviewed and updated.  Review of Systems  Constitutional: Negative for activity  change, appetite change, diaphoresis, fatigue and fever.  HENT: Negative.   Respiratory: Negative for cough, chest tightness and shortness of breath.   Cardiovascular: Negative for chest pain, palpitations and leg swelling.  Gastrointestinal: Negative.   Neurological: Negative.   Psychiatric/Behavioral: Negative.     Per HPI unless specifically indicated above     Objective:    BP 122/68 (BP Location: Left Arm)   Pulse 91   Temp 98.1 F (36.7 C) (Oral)   LMP 11/28/1971 (Approximate)   SpO2 97%   Wt Readings from Last 3 Encounters:  04/29/19 140 lb (63.5 kg)  03/28/19 147 lb (66.7 kg)  09/27/18 140 lb (63.5 kg)    Physical Exam Vitals and nursing note reviewed.  Constitutional:      General: She is awake. She is not in acute distress.    Appearance: She is well-developed and overweight. She is not ill-appearing.  HENT:     Head: Normocephalic.     Right Ear: Hearing normal.     Left Ear: Hearing normal.  Eyes:     General: Lids are normal.        Right eye: No discharge.        Left eye: No discharge.     Conjunctiva/sclera: Conjunctivae normal.     Pupils: Pupils are equal, round, and reactive to light.  Neck:     Vascular: No carotid bruit.  Cardiovascular:     Rate and Rhythm: Normal rate and regular rhythm.     Heart sounds: Normal heart sounds. No  murmur. No gallop.   Pulmonary:     Effort: Pulmonary effort is normal. No accessory muscle usage or respiratory distress.     Breath sounds: Normal breath sounds.     Comments: Clear throughout, no adventitious sounds. Abdominal:     General: Bowel sounds are normal.     Palpations: Abdomen is soft.     Tenderness: There is no abdominal tenderness.  Musculoskeletal:     Cervical back: Normal range of motion and neck supple.     Right lower leg: No edema.     Left lower leg: No edema.  Skin:    General: Skin is warm and dry.  Neurological:     Mental Status: She is alert and oriented to person, place, and  time.  Psychiatric:        Attention and Perception: Attention normal.        Mood and Affect: Mood normal.        Behavior: Behavior normal. Behavior is cooperative.        Thought Content: Thought content normal.        Judgment: Judgment normal.     Results for orders placed or performed during the hospital encounter of 04/29/19  Novel Coronavirus, NAA (Hosp order, Send-out to Ref Lab; TAT 18-24 hrs   Specimen: Nasopharyngeal Swab; Respiratory  Result Value Ref Range   SARS-CoV-2, NAA DETECTED (A) NOT DETECTED   Coronavirus Source NASOPHARYNGEAL   Lipase, blood  Result Value Ref Range   Lipase 33 11 - 51 U/L  Comprehensive metabolic panel  Result Value Ref Range   Sodium 141 135 - 145 mmol/L   Potassium 4.0 3.5 - 5.1 mmol/L   Chloride 106 98 - 111 mmol/L   CO2 22 22 - 32 mmol/L   Glucose, Bld 110 (H) 70 - 99 mg/dL   BUN 23 8 - 23 mg/dL   Creatinine, Ser 0.86 0.44 - 1.00 mg/dL   Calcium 8.9 8.9 - 10.3 mg/dL   Total Protein 7.5 6.5 - 8.1 g/dL   Albumin 3.6 3.5 - 5.0 g/dL   AST 34 15 - 41 U/L   ALT 24 0 - 44 U/L   Alkaline Phosphatase 43 38 - 126 U/L   Total Bilirubin 0.6 0.3 - 1.2 mg/dL   GFR calc non Af Amer >60 >60 mL/min   GFR calc Af Amer >60 >60 mL/min   Anion gap 13 5 - 15  CBC  Result Value Ref Range   WBC 3.5 (L) 4.0 - 10.5 K/uL   RBC 3.99 3.87 - 5.11 MIL/uL   Hemoglobin 11.5 (L) 12.0 - 15.0 g/dL   HCT 34.7 (L) 36.0 - 46.0 %   MCV 87.0 80.0 - 100.0 fL   MCH 28.8 26.0 - 34.0 pg   MCHC 33.1 30.0 - 36.0 g/dL   RDW 14.3 11.5 - 15.5 %   Platelets 223 150 - 400 K/uL   nRBC 0.0 0.0 - 0.2 %      Assessment & Plan:   Problem List Items Addressed This Visit      Respiratory   COPD (chronic obstructive pulmonary disease) ([place]) - Primary    Chronic, ongoing with good control.  Continue current medication regimen and adjust as needed.  Refills as needed.  Labs today.      Pneumonia due to COVID-19 virus    Improving at this time.  Will repeat CXR and continue  to monitor.  She has received her initial Covid vaccine per her report,  via health department.      Relevant Orders   CBC with Differential/Platelet   Comprehensive metabolic panel     Other   History of smoking    Recommend continued cessation.          Follow up plan: Return in about 6 months (around 12/29/2019) for COPD, HTN/HLD.

## 2019-07-01 NOTE — Patient Instructions (Signed)
[address] [place], [place] [address] ---- [place] for chest xray repeat   Community-Acquired Pneumonia, Adult Pneumonia is an infection of the lungs. It causes swelling in the airways of the lungs. Mucus and fluid may also build up inside the airways. One type of pneumonia can happen while a person is in a hospital. A different type can happen when a person is not in a hospital (community-acquired pneumonia).  What are the causes?  This condition is caused by germs (viruses, bacteria, or fungi). Some types of germs can be passed from one person to another. This can happen when you breathe in droplets from the cough or sneeze of an infected person. What increases the risk? You are more likely to develop this condition if you:  Have a long-term (chronic) disease, such as: ? Chronic obstructive pulmonary disease (COPD). ? Asthma. ? Cystic fibrosis. ? Congestive heart failure. ? Diabetes. ? Kidney disease.  Have HIV.  Have sickle cell disease.  Have had your spleen removed.  Do not take good care of your teeth and mouth (poor dental hygiene).  Have a medical condition that increases the risk of breathing in droplets from your own mouth and nose.  Have a weakened body defense system (immune system).  Are a smoker.  Travel to areas where the germs that cause this illness are common.  Are around certain animals or the places they live. What are the signs or symptoms?  A dry cough.  A wet (productive) cough.  Fever.  Sweating.  Chest pain. This often happens when breathing deeply or coughing.  Fast breathing or trouble breathing.  Shortness of breath.  Shaking chills.  Feeling tired (fatigue).  Muscle aches. How is this treated? Treatment for this condition depends on many things. Most adults can be treated at home. In some cases, treatment must happen in a hospital. Treatment may include:  Medicines given by mouth or through an IV tube.  Being given extra  oxygen.  Respiratory therapy. In rare cases, treatment for very bad pneumonia may include:  Using a machine to help you breathe.  Having a procedure to remove fluid from around your lungs. Follow these instructions at home: Medicines  Take over-the-counter and prescription medicines only as told by your doctor. ? Only take cough medicine if you are losing sleep.  If you were prescribed an antibiotic medicine, take it as told by your doctor. Do not stop taking the antibiotic even if you start to feel better. General instructions   Sleep with your head and neck raised (elevated). You can do this by sleeping in a recliner or by putting a few pillows under your head.  Rest as needed. Get at least 8 hours of sleep each night.  Drink enough water to keep your pee (urine) pale yellow.  Eat a healthy diet that includes plenty of vegetables, fruits, whole grains, low-fat dairy products, and lean protein.  Do not use any products that contain nicotine or tobacco. These include cigarettes, e-cigarettes, and chewing tobacco. If you need help quitting, ask your doctor.  Keep all follow-up visits as told by your doctor. This is important. How is this prevented? A shot (vaccine) can help prevent pneumonia. Shots are often suggested for:  People older than 84 years of age.  People older than 84 years of age who: ? Are having cancer treatment. ? Have long-term (chronic) lung disease. ? Have problems with their body's defense system. You may also prevent pneumonia if you take  these actions:  Get the flu (influenza) shot every year.  Go to the dentist as often as told.  Wash your hands often. If you cannot use soap and water, use hand sanitizer. Contact a doctor if:  You have a fever.  You lose sleep because your cough medicine does not help. Get help right away if:  You are short of breath and it gets worse.  You have more chest pain.  Your sickness gets worse. This is very  serious if: ? You are an older adult. ? Your body's defense system is weak.  You cough up blood. Summary  Pneumonia is an infection of the lungs.  Most adults can be treated at home. Some will need treatment in a hospital.  Drink enough water to keep your pee pale yellow.  Get at least 8 hours of sleep each night. This information is not intended to replace advice given to you by your health care provider. Make sure you discuss any questions you have with your health care provider. Document Revised: 09/15/2018 Document Reviewed: 01/21/2018 Elsevier Patient Education  [place].

## 2019-07-01 NOTE — Assessment & Plan Note (Signed)
Chronic, ongoing with good control.  Continue current medication regimen and adjust as needed.  Refills as needed.  Labs today.

## 2019-07-01 NOTE — Assessment & Plan Note (Signed)
Recommend continued cessation.  

## 2019-07-02 LAB — CBC WITH DIFFERENTIAL/PLATELET
Basophils Absolute: 0.1 10*3/uL (ref 0.0–0.2)
Basos: 1 %
EOS (ABSOLUTE): 0.1 10*3/uL (ref 0.0–0.4)
Eos: 2 %
Hematocrit: 33.9 % — ABNORMAL LOW (ref 34.0–46.6)
Hemoglobin: 11.2 g/dL (ref 11.1–15.9)
Immature Grans (Abs): 0 10*3/uL (ref 0.0–0.1)
Immature Granulocytes: 1 %
Lymphocytes Absolute: 1.3 10*3/uL (ref 0.7–3.1)
Lymphs: 23 %
MCH: 29.6 pg (ref 26.6–33.0)
MCHC: 33 g/dL (ref 31.5–35.7)
MCV: 89 fL (ref 79–97)
Monocytes Absolute: 0.4 10*3/uL (ref 0.1–0.9)
Monocytes: 7 %
Neutrophils Absolute: 3.6 10*3/uL (ref 1.4–7.0)
Neutrophils: 66 %
Platelets: 249 10*3/uL (ref 150–450)
RBC: 3.79 x10E6/uL (ref 3.77–5.28)
RDW: 14.5 % (ref 11.7–15.4)
WBC: 5.4 10*3/uL (ref 3.4–10.8)

## 2019-07-02 LAB — COMPREHENSIVE METABOLIC PANEL
ALT: 14 IU/L (ref 0–32)
AST: 20 IU/L (ref 0–40)
Albumin/Globulin Ratio: 1.8 (ref 1.2–2.2)
Albumin: 4.3 g/dL (ref 3.6–4.6)
Alkaline Phosphatase: 46 IU/L (ref 39–117)
BUN/Creatinine Ratio: 28 (ref 12–28)
BUN: 30 mg/dL — ABNORMAL HIGH (ref 8–27)
Bilirubin Total: 0.3 mg/dL (ref 0.0–1.2)
CO2: 24 mmol/L (ref 20–29)
Calcium: 9.7 mg/dL (ref 8.7–10.3)
Chloride: 105 mmol/L (ref 96–106)
Creatinine, Ser: 1.07 mg/dL — ABNORMAL HIGH (ref 0.57–1.00)
GFR calc Af Amer: 54 mL/min/{1.73_m2} — ABNORMAL LOW (ref >59)
GFR calc non Af Amer: 47 mL/min/{1.73_m2} — ABNORMAL LOW (ref >59)
Globulin, Total: 2.4 g/dL (ref 1.5–4.5)
Glucose: 98 mg/dL (ref 65–99)
Potassium: 4.8 mmol/L (ref 3.5–5.2)
Sodium: 142 mmol/L (ref 134–144)
Total Protein: 6.7 g/dL (ref 6.0–8.5)

## 2019-07-03 NOTE — Progress Notes (Signed)
Please let patient know that overall labs are remaining with baseline kidney function, normal liver function, and improvement in blood counts from previous.  Have a great day!!

## 2019-07-05 ENCOUNTER — Ambulatory Visit
Admission: RE | Admit: 2019-07-05 | Discharge: 2019-07-05 | Disposition: A | Discharge status: Home / Self Care | Payer: Medicare Other | Source: Ambulatory Visit | Attending: Nurse Practitioner | Admitting: Nurse Practitioner

## 2019-07-05 ENCOUNTER — Other Ambulatory Visit: Payer: Self-pay

## 2019-07-05 ENCOUNTER — Ambulatory Visit
Admission: RE | Admit: 2019-07-05 | Discharge: 2019-07-05 | Disposition: A | Discharge status: Home / Self Care | Payer: Medicare Other | Attending: Nurse Practitioner | Admitting: Nurse Practitioner

## 2019-07-05 DIAGNOSIS — J189 Pneumonia, unspecified organism: Secondary | ICD-10-CM | POA: Diagnosis not present

## 2019-07-05 DIAGNOSIS — U071 COVID-19: Secondary | ICD-10-CM

## 2019-07-05 DIAGNOSIS — J1282 Pneumonia due to coronavirus disease 2019: Secondary | ICD-10-CM | POA: Diagnosis not present

## 2019-07-05 NOTE — Addendum Note (Signed)
Addended by: Marnee Guarneri T on: 07/05/2019 01:11 PM   Modules accepted: Orders

## 2019-07-06 NOTE — Progress Notes (Signed)
Please let Karen Velasquez know her pneumonia is improving, previous pneumonia is looking better on imaging.  This is great news.  If she needs anything let me know.  Have a wonderful day!!

## 2019-08-12 ENCOUNTER — Encounter: Payer: Self-pay | Admitting: Nurse Practitioner

## 2019-09-03 ENCOUNTER — Other Ambulatory Visit: Payer: Self-pay | Admitting: Family Medicine

## 2019-09-03 NOTE — Telephone Encounter (Signed)
Requested Prescriptions  Pending Prescriptions Disp Refills  . 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

## 2019-09-15 ENCOUNTER — Other Ambulatory Visit: Payer: Self-pay

## 2019-09-15 ENCOUNTER — Ambulatory Visit (INDEPENDENT_AMBULATORY_CARE_PROVIDER_SITE_OTHER): Payer: Medicare Other | Admitting: Dermatology

## 2019-09-15 DIAGNOSIS — R52 Pain, unspecified: Secondary | ICD-10-CM

## 2019-09-15 DIAGNOSIS — Z8616 Personal history of COVID-19: Secondary | ICD-10-CM | POA: Diagnosis not present

## 2019-09-15 DIAGNOSIS — L578 Other skin changes due to chronic exposure to nonionizing radiation: Secondary | ICD-10-CM | POA: Diagnosis not present

## 2019-09-15 DIAGNOSIS — L820 Inflamed seborrheic keratosis: Secondary | ICD-10-CM

## 2019-09-15 DIAGNOSIS — L821 Other seborrheic keratosis: Secondary | ICD-10-CM | POA: Diagnosis not present

## 2019-09-15 DIAGNOSIS — R079 Chest pain, unspecified: Secondary | ICD-10-CM | POA: Diagnosis not present

## 2019-09-15 NOTE — Progress Notes (Addendum)
   New Patient Visit  Subjective  Karen Velasquez is a 86 y.o. female who presents for the following: Rash (patient got a sunburn about 3-4 weeks ago, and about a week after the sunburn it started hurting. Patient states that the skin itself doesn't hurt but deeper into the tissue hurts. Condition is improving especially today but she was concerned and still wanted it checked. ).  Objective  Well appearing patient in no apparent distress; mood and affect are within normal limits.  A focused examination was performed including the chest. Relevant physical exam findings are noted in the Assessment and Plan.  Objective  Chest: Diffuse scaly erythematous macules with underlying dyspigmentation.   Objective  Left chest x 6 (6): Erythematous keratotic or waxy stuck-on papule or plaque.   Objective  Right Breast: Chest clear other than ISK's and actinic damage  Assessment & Plan    Actinic skin damage Chest Photoprotection and frequent self skin exams advised.  Sunscreen SPF of 30 or greater and sunscreen clothing  Sunburn -  seems resolved today - area still tender to the touch may be Sk's that are irritated in the area.   Inflamed seborrheic keratosis (6) Left chest x 6 LN2 x6 - (destruction with Liquid nitrogen x6 performed)  Pain chest  Signs and symptoms not consistent with heart disease (no SOB, fatigue, nausea, sweating, or fatigue per patient), but patient should be evaluated by her PCP.   May be also associated with lungs symptoms from recent COVID - patient to f/u with PCP.  Seborrheic Keratoses - Stuck-on, waxy, tan-brown papules and plaques  - Discussed benign etiology and prognosis. - Observe - Call for any changes   Return in about 6 weeks (around 10/27/2019).   Luther Redo, CMA, am acting as scribe for Sarina Ser, MD . Documentation: I have reviewed the above documentation for accuracy and completeness, and I agree with the above.  Sarina Ser,  MD

## 2019-09-16 ENCOUNTER — Encounter: Payer: Self-pay | Admitting: Dermatology

## 2019-10-29 ENCOUNTER — Other Ambulatory Visit: Payer: Self-pay | Admitting: Family Medicine

## 2019-10-29 NOTE — Telephone Encounter (Signed)
.   Requested Prescriptions  Pending Prescriptions Disp Refills  . 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

## 2019-11-15 ENCOUNTER — Other Ambulatory Visit: Payer: Self-pay | Admitting: Family Medicine

## 2019-11-15 NOTE — Telephone Encounter (Signed)
Requested Prescriptions  Pending Prescriptions Disp Refills  . fenofibrate micronized (ANTARA) 130 MG capsule [Pharmacy Med Name: FENOFIBRATE CAPS '130MG'$ ] 90 capsule 1    Sig: TAKE 1 CAPSULE DAILY BEFORE BREAKFAST     Cardiovascular:  Antilipid - Fibric Acid Derivatives Failed - 11/15/2019  9:14 AM      Failed - LDL in normal range and within 360 days    LDL Chol Calc (NIH)  Date Value Ref Range Status  03/28/2019 77 0 - 99 mg/dL Final         Failed - Cr in normal range and within 180 days    Creatinine, Ser  Date Value Ref Range Status  07/01/2019 1.07 (H) 0.57 - 1.00 mg/dL Final         Failed - eGFR in normal range and within 180 days    GFR calc Af Amer  Date Value Ref Range Status  07/01/2019 54 (L) >59 mL/min/1.73 Final   GFR calc non Af Amer  Date Value Ref Range Status  07/01/2019 47 (L) >59 mL/min/1.73 Final         Passed - Total Cholesterol in normal range and within 360 days    Cholesterol, Total  Date Value Ref Range Status  03/28/2019 152 100 - 199 mg/dL Final   Cholesterol Piccolo, Waived  Date Value Ref Range Status  12/08/2014 153 <200 mg/dL Final    Comment:                            Desirable                <200                         Borderline High      200- 239                         High                     >239          Passed - HDL in normal range and within 360 days    HDL  Date Value Ref Range Status  03/28/2019 61 >39 mg/dL Final         Passed - Triglycerides in normal range and within 360 days    Triglycerides  Date Value Ref Range Status  03/28/2019 74 0 - 149 mg/dL Final   Triglycerides Piccolo,Waived  Date Value Ref Range Status  12/08/2014 74 <150 mg/dL Final    Comment:                            Normal                   <150                         Borderline High     150 - 199                         High                200 - 499  Very High                >499          Passed - ALT in normal  range and within 180 days    ALT  Date Value Ref Range Status  07/01/2019 14 0 - 32 IU/L Final         Passed - AST in normal range and within 180 days    AST  Date Value Ref Range Status  07/01/2019 20 0 - 40 IU/L Final         Passed - Valid encounter within last 12 months    Recent Outpatient Visits          4 months ago Simple chronic bronchitis ([place])   [place], [place] T, NP   6 months ago Pneumonia due to COVID-19 virus   [employer], [place] T, NP   6 months ago Close exposure to COVID-19 virus   [employer], Barbaraann Faster, NP   7 months ago 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

## 2019-11-17 ENCOUNTER — Other Ambulatory Visit: Payer: Self-pay

## 2019-11-17 ENCOUNTER — Ambulatory Visit (INDEPENDENT_AMBULATORY_CARE_PROVIDER_SITE_OTHER): Payer: Medicare Other | Admitting: Dermatology

## 2019-11-17 DIAGNOSIS — L820 Inflamed seborrheic keratosis: Secondary | ICD-10-CM | POA: Diagnosis not present

## 2019-11-17 NOTE — Progress Notes (Signed)
   Follow-Up Visit   Subjective  Karen Velasquez is a 86 y.o. female who presents for the following: Seborrheic Keratosis (6wk f/u ISKs chest, still has pain/irritation mainly at night ).   The following portions of the chart were reviewed this encounter and updated as appropriate:  Tobacco  Allergies  Meds  Problems  Med Hx  Surg Hx  Fam Hx      Review of Systems:  No other skin or systemic complaints except as noted in HPI or Assessment and Plan.  Objective  Well appearing patient in no apparent distress; mood and affect are within normal limits.  A focused examination was performed including chest. Relevant physical exam findings are noted in the Assessment and Plan.  Objective  Chest - Medial [facility]): Residual stuck on waxy pap chest   Assessment & Plan  Inflamed seborrheic keratosis Chest - Medial (Center) Improved, but some mild persistence 6 wks s/p Ln2 txt, pt declines further txt  Pain of skin of chest - Discussed Sarna lotion for pain/irritation on chest Discussed Voltaren cream for pain/irritation on chest  Return if symptoms worsen or fail to improve.   I, Othelia Pulling, RMA, am acting as scribe for Sarina Ser, MD . Documentation: I have reviewed the above documentation for accuracy and completeness, and I agree with the above.  Sarina Ser, MD

## 2019-11-17 NOTE — Patient Instructions (Addendum)
For the pain/irritation on chest may use: Sarna Lotion Voltaren gel  Recommend sunscreen with SPF of 15 or greater

## 2019-11-21 ENCOUNTER — Encounter: Payer: Self-pay | Admitting: Dermatology

## 2019-11-28 ENCOUNTER — Ambulatory Visit: Payer: Medicare Other | Admitting: Podiatry

## 2019-11-30 ENCOUNTER — Other Ambulatory Visit: Payer: Self-pay | Admitting: Family Medicine

## 2019-12-28 ENCOUNTER — Ambulatory Visit: Payer: Medicare Other | Admitting: Nurse Practitioner

## 2020-01-03 ENCOUNTER — Ambulatory Visit: Payer: Medicare Other | Admitting: Nurse Practitioner

## 2020-01-17 ENCOUNTER — Ambulatory Visit: Payer: Medicare Other | Admitting: Nurse Practitioner

## 2020-01-20 ENCOUNTER — Other Ambulatory Visit: Payer: Self-pay

## 2020-01-20 ENCOUNTER — Encounter: Payer: Self-pay | Admitting: Family Medicine

## 2020-01-20 ENCOUNTER — Ambulatory Visit (INDEPENDENT_AMBULATORY_CARE_PROVIDER_SITE_OTHER): Payer: Medicare Other | Admitting: Family Medicine

## 2020-01-20 VITALS — BP 139/80 | HR 86 | Temp 98.6°F | Wt 145.8 lb

## 2020-01-20 DIAGNOSIS — N1831 Chronic kidney disease, stage 3a: Secondary | ICD-10-CM

## 2020-01-20 DIAGNOSIS — I129 Hypertensive chronic kidney disease with stage 1 through stage 4 chronic kidney disease, or unspecified chronic kidney disease: Secondary | ICD-10-CM | POA: Diagnosis not present

## 2020-01-20 DIAGNOSIS — J410 Simple chronic bronchitis: Secondary | ICD-10-CM | POA: Diagnosis not present

## 2020-01-20 DIAGNOSIS — E782 Mixed hyperlipidemia: Secondary | ICD-10-CM | POA: Diagnosis not present

## 2020-01-20 LAB — MICROALBUMIN, URINE WAIVED
Creatinine, Urine Waived: 100 mg/dL (ref 10–300)
Microalb, Ur Waived: 10 mg/L (ref 0–19)
Microalb/Creat Ratio: <30 mg/g (ref <30)

## 2020-01-20 MED ORDER — FLUTICASONE-SALMETEROL 250-50 MCG/DOSE IN AEPB: 1.0000 | INHALATION_SPRAY | Freq: Two times a day (BID) | RESPIRATORY_TRACT | 1 refills | Status: DC

## 2020-01-20 MED ORDER — ESOMEPRAZOLE MAGNESIUM 40 MG PO CPDR: 40.0000 mg | DELAYED_RELEASE_CAPSULE | Freq: Every day | ORAL | 1 refills | Status: DC

## 2020-01-20 MED ORDER — TRIAMCINOLONE ACETONIDE 0.5 % EX OINT: 1.0000 "application " | TOPICAL_OINTMENT | Freq: Two times a day (BID) | CUTANEOUS | 0 refills | Status: DC

## 2020-01-20 MED ORDER — NAPROXEN 375 MG PO TABS: 375.0000 mg | ORAL_TABLET | Freq: Three times a day (TID) | ORAL | 1 refills | Status: DC

## 2020-01-20 MED ORDER — FLUTICASONE PROPIONATE 50 MCG/ACT NA SUSP: 2.0000 | Freq: Two times a day (BID) | NASAL | 4 refills | Status: DC

## 2020-01-20 MED ORDER — ESTRADIOL 0.5 MG PO TABS: 0.2500 mg | ORAL_TABLET | Freq: Every day | ORAL | 1 refills | Status: DC

## 2020-01-20 NOTE — Progress Notes (Signed)
BP 139/80 (BP Location: Left Arm, Patient Position: Sitting, Cuff Size: Normal)   Pulse 86   Temp 98.6 F (37 C) (Oral)   Wt 145 lb 12.8 oz (66.1 kg)   LMP 11/28/1971 (Approximate)   SpO2 98%   BMI 26.67 kg/m    Subjective:    Patient ID: Karen Velasquez, female    DOB: September 04, 1930, 86 y.o.   MRN: [id number]  HPI: Karen Velasquez is a 86 y.o. female  Chief Complaint  Patient presents with  . COPD  . Hypertension  . Hyperlipidemia   HYPERTENSION / HYPERLIPIDEMIA Satisfied with current treatment? yes Duration of hypertension: chronic BP monitoring frequency: not checking BP medication side effects: no Past BP meds: lisinopril-HCTZ Duration of hyperlipidemia: chronic Cholesterol medication side effects: no Cholesterol supplements: none Past cholesterol medications: fenofibrate, zetia Medication compliance: excellent compliance Aspirin: no Recent stressors: no Recurrent headaches: no Visual changes: no Palpitations: no Dyspnea: no Chest pain: no Lower extremity edema: no Dizzy/lightheaded: no   COPD COPD status: controlled Satisfied with current treatment?: yes Oxygen use: no Dyspnea frequency: occasionally Cough frequency: occasionally Rescue inhaler frequency:  rarely Limitation of activity: no Productive cough: none Pneumovax: Up to Date Influenza: Up to Date  Has been on estradiol for years, as she's tried to come off of it, she had severe hot flashes, but that was last a couple of years ago. She is otherwise feeling well with no other concerns or complaints at this time.   Relevant past medical, surgical, family and social history reviewed and updated as indicated. Interim medical history since our last visit reviewed. Allergies and medications reviewed and updated.  Review of Systems  Constitutional: Negative.   Respiratory: Negative.   Cardiovascular: Negative.   Gastrointestinal: Negative.   Musculoskeletal: Negative.   Skin: Positive for rash.  Negative for color change, pallor and wound.  Psychiatric/Behavioral: Negative.     Per HPI unless specifically indicated above     Objective:    BP 139/80 (BP Location: Left Arm, Patient Position: Sitting, Cuff Size: Normal)   Pulse 86   Temp 98.6 F (37 C) (Oral)   Wt 145 lb 12.8 oz (66.1 kg)   LMP 11/28/1971 (Approximate)   SpO2 98%   BMI 26.67 kg/m   Wt Readings from Last 3 Encounters:  01/20/20 145 lb 12.8 oz (66.1 kg)  04/29/19 140 lb (63.5 kg)  03/28/19 147 lb (66.7 kg)    Physical Exam Vitals and nursing note reviewed.  Constitutional:      General: She is not in acute distress.    Appearance: Normal appearance. She is not ill-appearing, toxic-appearing or diaphoretic.  HENT:     Head: Normocephalic and atraumatic.     Right Ear: External ear normal.     Left Ear: External ear normal.     Nose: Nose normal.     Mouth/Throat:     Mouth: Mucous membranes are moist.     Pharynx: Oropharynx is clear.  Eyes:     General: No scleral icterus.       Right eye: No discharge.        Left eye: No discharge.     Extraocular Movements: Extraocular movements intact.     Conjunctiva/sclera: Conjunctivae normal.     Pupils: Pupils are equal, round, and reactive to light.  Cardiovascular:     Rate and Rhythm: Normal rate and regular rhythm.     Pulses: Normal pulses.     Heart sounds: Normal heart sounds.  No murmur heard.  No friction rub. No gallop.   Pulmonary:     Effort: Pulmonary effort is normal. No respiratory distress.     Breath sounds: Normal breath sounds. No stridor. No wheezing, rhonchi or rales.  Chest:     Chest wall: No tenderness.  Musculoskeletal:        General: Normal range of motion.     Cervical back: Normal range of motion and neck supple.  Skin:    General: Skin is warm and dry.     Capillary Refill: Capillary refill takes less than 2 seconds.     Coloration: Skin is not jaundiced or pale.     Findings: No bruising, erythema, lesion or rash.    Neurological:     General: No focal deficit present.     Mental Status: She is alert and oriented to person, place, and time. Mental status is at baseline.  Psychiatric:        Mood and Affect: Mood normal.        Behavior: Behavior normal.        Thought Content: Thought content normal.        Judgment: Judgment normal.     Results for orders placed or performed in visit on 01/20/20  Comprehensive metabolic panel  Result Value Ref Range   Glucose 97 65 - 99 mg/dL   BUN 26 8 - 27 mg/dL   Creatinine, Ser 1.05 (H) 0.57 - 1.00 mg/dL   GFR calc non Af Amer 48 (L) >59 mL/min/1.73   GFR calc Af Amer 55 (L) >59 mL/min/1.73   BUN/Creatinine Ratio 25 12 - 28   Sodium 142 134 - 144 mmol/L   Potassium 4.7 3.5 - 5.2 mmol/L   Chloride 105 96 - 106 mmol/L   CO2 25 20 - 29 mmol/L   Calcium 9.6 8.7 - 10.3 mg/dL   Total Protein 6.8 6.0 - 8.5 g/dL   Albumin 4.5 3.6 - 4.6 g/dL   Globulin, Total 2.3 1.5 - 4.5 g/dL   Albumin/Globulin Ratio 2.0 1.2 - 2.2   Bilirubin Total 0.4 0.0 - 1.2 mg/dL   Alkaline Phosphatase 44 (L) 48 - 121 IU/L   AST 20 0 - 40 IU/L   ALT 17 0 - 32 IU/L  Lipid Panel w/o Chol/HDL Ratio  Result Value Ref Range   Cholesterol, Total 153 100 - 199 mg/dL   Triglycerides 77 0 - 149 mg/dL   HDL 61 >39 mg/dL   VLDL Cholesterol Cal 15 5 - 40 mg/dL   LDL Chol Calc (NIH) 77 0 - 99 mg/dL  Microalbumin, Urine Waived  Result Value Ref Range   Microalb, Ur Waived 10 0 - 19 mg/L   Creatinine, Urine Waived 100 10 - 300 mg/dL   Microalb/Creat Ratio <30 <30 mg/g      Assessment & Plan:   Problem List Items Addressed This Visit      Respiratory   COPD (chronic obstructive pulmonary disease) ([place])    Under good control on current regimen. Continue current regimen. Continue to monitor. Call with any concerns. Refills given. Labs drawn today.       Relevant Medications   Fluticasone-Salmeterol (WIXELA INHUB) 250-50 MCG/DOSE AEPB   fluticasone (FLONASE) 50 MCG/ACT nasal spray      Genitourinary   Benign hypertensive renal disease    Under good control on current regimen. Continue current regimen. Continue to monitor. Call with any concerns. Refills given. Labs drawn today.  Relevant Orders   Comprehensive metabolic panel (Completed)   Microalbumin, Urine Waived (Completed)   CKD (chronic kidney disease) stage 3, GFR 30-59 ml/min    Rechecking labs today. Await results.       Relevant Orders   Comprehensive metabolic panel (Completed)     Other   Hyperlipidemia - Primary    Under good control on current regimen. Continue current regimen. Continue to monitor. Call with any concerns. Refills given. Labs drawn today.       Relevant Orders   Comprehensive metabolic panel (Completed)   Lipid Panel w/o Chol/HDL Ratio (Completed)       Follow up plan: Return in about 6 months (around 07/22/2020) for Physical.

## 2020-01-21 ENCOUNTER — Encounter: Payer: Self-pay | Admitting: Family Medicine

## 2020-01-21 LAB — COMPREHENSIVE METABOLIC PANEL
ALT: 17 IU/L (ref 0–32)
AST: 20 IU/L (ref 0–40)
Albumin/Globulin Ratio: 2 (ref 1.2–2.2)
Albumin: 4.5 g/dL (ref 3.6–4.6)
Alkaline Phosphatase: 44 IU/L — ABNORMAL LOW (ref 48–121)
BUN/Creatinine Ratio: 25 (ref 12–28)
BUN: 26 mg/dL (ref 8–27)
Bilirubin Total: 0.4 mg/dL (ref 0.0–1.2)
CO2: 25 mmol/L (ref 20–29)
Calcium: 9.6 mg/dL (ref 8.7–10.3)
Chloride: 105 mmol/L (ref 96–106)
Creatinine, Ser: 1.05 mg/dL — ABNORMAL HIGH (ref 0.57–1.00)
GFR calc Af Amer: 55 mL/min/{1.73_m2} — ABNORMAL LOW (ref >59)
GFR calc non Af Amer: 48 mL/min/{1.73_m2} — ABNORMAL LOW (ref >59)
Globulin, Total: 2.3 g/dL (ref 1.5–4.5)
Glucose: 97 mg/dL (ref 65–99)
Potassium: 4.7 mmol/L (ref 3.5–5.2)
Sodium: 142 mmol/L (ref 134–144)
Total Protein: 6.8 g/dL (ref 6.0–8.5)

## 2020-01-21 LAB — LIPID PANEL W/O CHOL/HDL RATIO
Cholesterol, Total: 153 mg/dL (ref 100–199)
HDL: 61 mg/dL (ref >39)
LDL Chol Calc (NIH): 77 mg/dL (ref 0–99)
Triglycerides: 77 mg/dL (ref 0–149)
VLDL Cholesterol Cal: 15 mg/dL (ref 5–40)

## 2020-01-21 NOTE — Assessment & Plan Note (Signed)
Under good control on current regimen. Continue current regimen. Continue to monitor. Call with any concerns. Refills given. Labs drawn today.   

## 2020-01-21 NOTE — Assessment & Plan Note (Signed)
Rechecking labs today. Await results.  

## 2020-01-24 ENCOUNTER — Encounter: Payer: Self-pay | Admitting: Family Medicine

## 2020-03-03 IMAGING — CR DG CHEST 1V
1 series · 1 of 1 positions shown · non-contrast
Comparison: None.

CLINICAL DATA: Cough.

EXAM:
CHEST  1 VIEW

[x chest ap]
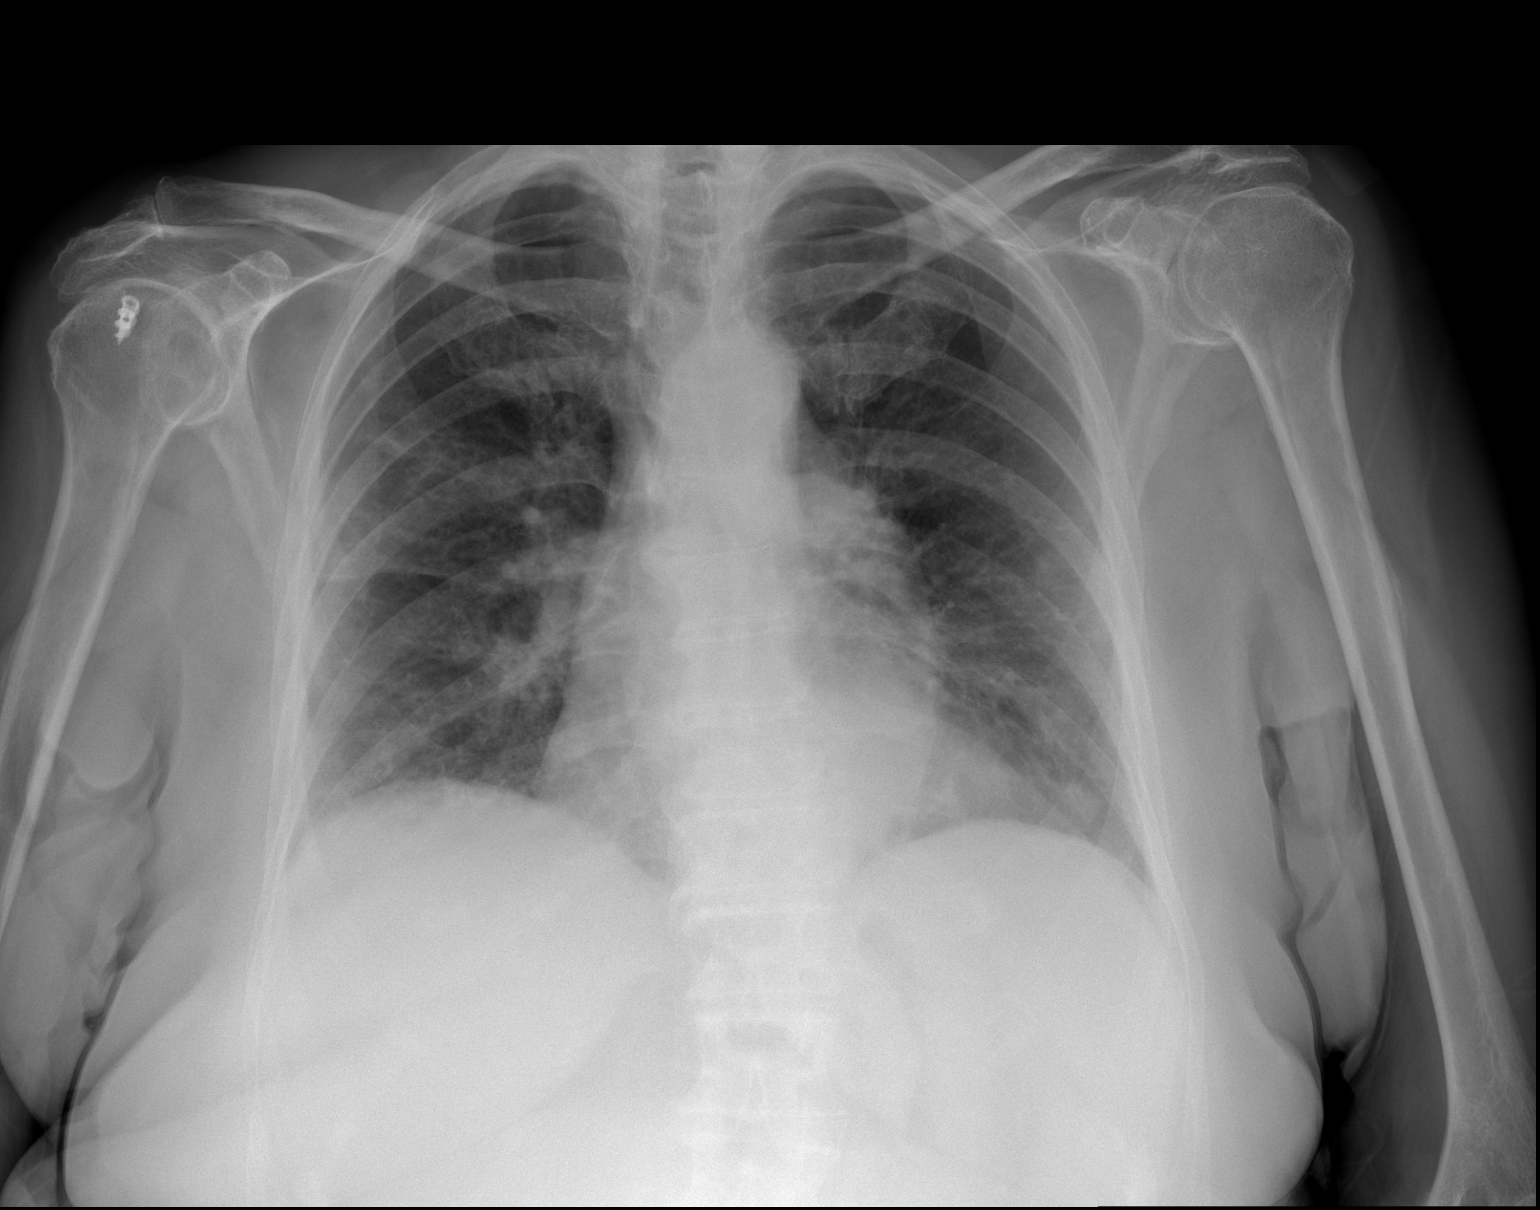

[1 of 1 positions shown; findings below may reference images not displayed]

FINDINGS: The heart size and mediastinal contours are within normal limits. No
pneumothorax or pleural effusion is noted. Mild bilateral
ill-defined opacities are noted in both lung bases in the right
upper lobe concerning for multifocal pneumonia. The visualized
skeletal structures are unremarkable.
IMPRESSION: Mild bilateral ill-defined opacities are noted concerning for
multifocal pneumonia, which may be viral in etiology.

## 2020-03-19 ENCOUNTER — Ambulatory Visit: Payer: Medicare Other

## 2020-03-21 DIAGNOSIS — Z23 Encounter for immunization: Secondary | ICD-10-CM | POA: Diagnosis not present

## 2020-03-29 ENCOUNTER — Telehealth: Payer: Self-pay | Admitting: Family Medicine

## 2020-03-29 NOTE — Telephone Encounter (Signed)
Copied from [place] [id number]. Topic: Medicare AWV >> Mar 29, 2020  1:37 PM Cher Nakai R wrote: Reason for CRM:   Left message for patient to call back and schedule the Medicare Annual Wellness Visit (AWV) virtually.  Last AWV 03/28/2019  Please schedule at anytime with [facility] Health Advisor.  45 minute appointment  Any questions, please call me at [phone]

## 2020-03-30 ENCOUNTER — Telehealth: Payer: Self-pay

## 2020-03-30 NOTE — Telephone Encounter (Signed)
Brooke sent [place] information before. I'm not sure where it went

## 2020-03-30 NOTE — Telephone Encounter (Signed)
Copied from [place] [id number]. Topic: General - Inquiry >> Mar 30, 2020 10:46 AM Greggory Keen D wrote: Reason for CRM:  . Pt's daughter called wanting to know how to get her mom a Life Alert necklace.  CB# [phone]

## 2020-03-30 NOTE — Telephone Encounter (Signed)
Routing to provider. Where can I find the information on this?

## 2020-04-02 NOTE — Telephone Encounter (Signed)
Received information from [place] for the Med Alert. Called and let patient's daughter know. She requested to have information mailed to her at [address] [place] [address].   Information printed and mailed as requested.

## 2020-05-11 DIAGNOSIS — H01009 Unspecified blepharitis unspecified eye, unspecified eyelid: Secondary | ICD-10-CM | POA: Diagnosis not present

## 2020-05-11 DIAGNOSIS — H18512 Endothelial corneal dystrophy, left eye: Secondary | ICD-10-CM | POA: Diagnosis not present

## 2020-05-11 DIAGNOSIS — H01004 Unspecified blepharitis left upper eyelid: Secondary | ICD-10-CM | POA: Diagnosis not present

## 2020-05-16 ENCOUNTER — Other Ambulatory Visit: Payer: Self-pay | Admitting: Internal Medicine

## 2020-05-17 ENCOUNTER — Telehealth: Payer: Self-pay

## 2020-05-17 NOTE — Telephone Encounter (Signed)
Routing to provider  

## 2020-05-17 NOTE — Telephone Encounter (Signed)
She can get that over the counter- which is why they aren't covering it.

## 2020-05-17 NOTE — Telephone Encounter (Signed)
Copied from [place] [id number]. Topic: Quick Communication - Rx Refill/Question >> May 17, 2020  9:36 AM Hinda Lenis D wrote: PT requesting an alternative medication since insurance will not cover /  cetirizine (ZYRTEC) 10 MG tablet [[id number]] / please advise

## 2020-05-17 NOTE — Telephone Encounter (Signed)
Patient notified

## 2020-06-11 ENCOUNTER — Other Ambulatory Visit: Payer: Self-pay | Admitting: Family Medicine

## 2020-06-11 NOTE — Telephone Encounter (Signed)
Approved per protocol.  Requested Prescriptions  Pending Prescriptions Disp Refills  . lisinopril-hydrochlorothiazide (ZESTORETIC) 10-12.5 MG tablet [Pharmacy Med Name: LISINOPRIL/HCTZ TABS 10/12.5MG ] 90 tablet 0    Sig: TAKE 1 TABLET DAILY     Cardiovascular:  ACEI + Diuretic Combos Failed - 06/11/2020  1:21 PM      Failed - Cr in normal range and within 180 days    Creatinine, Ser  Date Value Ref Range Status  01/20/2020 1.05 (H) 0.57 - 1.00 mg/dL Final         Passed - Na in normal range and within 180 days    Sodium  Date Value Ref Range Status  01/20/2020 142 134 - 144 mmol/L Final         Passed - K in normal range and within 180 days    Potassium  Date Value Ref Range Status  01/20/2020 4.7 3.5 - 5.2 mmol/L Final         Passed - Ca in normal range and within 180 days    Calcium  Date Value Ref Range Status  01/20/2020 9.6 8.7 - 10.3 mg/dL Final         Passed - Patient is not pregnant      Passed - Last BP in normal range    BP Readings from Last 1 Encounters:  01/20/20 139/80         Passed - Valid encounter within last 6 months    Recent Outpatient Visits          4 months ago Mixed hyperlipidemia   [facility] [place], Megan P, DO   11 months ago Simple chronic bronchitis (HCC)   [facility] [place], [place] T, NP   1 year ago Pneumonia due to COVID-19 virus   [facility] [place], Dorie Rank, NP   1 year ago Close exposure to COVID-19 virus   [facility], [place] T, NP   1 year ago Encounter for [employer] annual wellness exam   [facility] [place], Oralia Rud, DO      Future Appointments            In 1 month [place], Oralia Rud, DO [employer], [facility]

## 2020-06-13 ENCOUNTER — Telehealth: Payer: Self-pay | Admitting: Family Medicine

## 2020-06-13 NOTE — Telephone Encounter (Signed)
Copied from CRM [id number]. Topic: Medicare AWV >> Jun 13, 2020 11:48 AM Claudette Laws R wrote: Reason for CRM:  Patient declined the Medicare Wellness Visit with [facility]  - stated that she does not need to do AWVS again - Do not call back

## 2020-07-23 ENCOUNTER — Ambulatory Visit: Payer: Medicare Other | Admitting: Family Medicine

## 2020-07-26 ENCOUNTER — Ambulatory Visit: Payer: Medicare Other | Admitting: Family Medicine

## 2020-07-31 ENCOUNTER — Other Ambulatory Visit: Payer: Self-pay

## 2020-07-31 ENCOUNTER — Encounter: Payer: Self-pay | Admitting: Family Medicine

## 2020-07-31 ENCOUNTER — Ambulatory Visit (INDEPENDENT_AMBULATORY_CARE_PROVIDER_SITE_OTHER): Payer: Medicare Other | Admitting: Family Medicine

## 2020-07-31 VITALS — BP 134/74 | HR 93 | Temp 98.4°F | Ht 61.0 in | Wt 146.8 lb

## 2020-07-31 DIAGNOSIS — J410 Simple chronic bronchitis: Secondary | ICD-10-CM | POA: Diagnosis not present

## 2020-07-31 DIAGNOSIS — Z780 Asymptomatic menopausal state: Secondary | ICD-10-CM

## 2020-07-31 DIAGNOSIS — I129 Hypertensive chronic kidney disease with stage 1 through stage 4 chronic kidney disease, or unspecified chronic kidney disease: Secondary | ICD-10-CM

## 2020-07-31 DIAGNOSIS — E782 Mixed hyperlipidemia: Secondary | ICD-10-CM

## 2020-07-31 DIAGNOSIS — K219 Gastro-esophageal reflux disease without esophagitis: Secondary | ICD-10-CM | POA: Diagnosis not present

## 2020-07-31 DIAGNOSIS — Z0000 Encounter for general adult medical examination without abnormal findings: Secondary | ICD-10-CM | POA: Diagnosis not present

## 2020-07-31 DIAGNOSIS — N1831 Chronic kidney disease, stage 3a: Secondary | ICD-10-CM | POA: Diagnosis not present

## 2020-07-31 LAB — URINALYSIS, ROUTINE W REFLEX MICROSCOPIC
Bilirubin, UA: NEGATIVE
Glucose, UA: NEGATIVE
Leukocytes,UA: NEGATIVE
Nitrite, UA: NEGATIVE
Protein,UA: NEGATIVE
RBC, UA: NEGATIVE
Specific Gravity, UA: 1.025 (ref 1.005–1.030)
Urobilinogen, Ur: 0.2 mg/dL (ref 0.2–1.0)
pH, UA: 5 (ref 5.0–7.5)

## 2020-07-31 LAB — MICROALBUMIN, URINE WAIVED
Creatinine, Urine Waived: 300 mg/dL (ref 10–300)
Microalb, Ur Waived: 30 mg/L — ABNORMAL HIGH (ref 0–19)
Microalb/Creat Ratio: <30 mg/g (ref <30)

## 2020-07-31 MED ORDER — LISINOPRIL-HYDROCHLOROTHIAZIDE 10-12.5 MG PO TABS: 1.0000 | ORAL_TABLET | Freq: Every day | ORAL | 1 refills | Status: DC

## 2020-07-31 MED ORDER — ALBUTEROL SULFATE HFA 108 (90 BASE) MCG/ACT IN AERS: 4.0000 | INHALATION_SPRAY | Freq: Four times a day (QID) | RESPIRATORY_TRACT | 1 refills | Status: DC | PRN

## 2020-07-31 MED ORDER — FLUTICASONE-SALMETEROL 250-50 MCG/DOSE IN AEPB: 1.0000 | INHALATION_SPRAY | Freq: Two times a day (BID) | RESPIRATORY_TRACT | 1 refills | Status: DC

## 2020-07-31 MED ORDER — ESTRADIOL 0.5 MG PO TABS: 0.2500 mg | ORAL_TABLET | Freq: Every day | ORAL | 1 refills | Status: DC

## 2020-07-31 MED ORDER — FENOFIBRATE MICRONIZED 130 MG PO CAPS: 130.0000 mg | ORAL_CAPSULE | Freq: Every day | ORAL | 1 refills | Status: DC

## 2020-07-31 MED ORDER — ESOMEPRAZOLE MAGNESIUM 40 MG PO CPDR: 40.0000 mg | DELAYED_RELEASE_CAPSULE | Freq: Every day | ORAL | 1 refills | Status: DC

## 2020-07-31 NOTE — Assessment & Plan Note (Signed)
Under good control on current regimen. Continue current regimen. Continue to monitor. Call with any concerns. Refills given. Labs drawn today.   

## 2020-07-31 NOTE — Assessment & Plan Note (Signed)
Rechecking labs today. Await results. Treat as needed.  °

## 2020-07-31 NOTE — Progress Notes (Signed)
BP 134/74   Pulse 93   Temp 98.4 F (36.9 C)   Ht 5\' 1"  (1.549 m)   Wt 146 lb 12.8 oz (66.6 kg)   LMP 11/28/1971 (Approximate)   SpO2 98%   BMI 27.74 kg/m    Subjective:    Patient ID: Karen Velasquez, female    DOB: Mar 03, 1931, 87 y.o.   MRN: [id number]  HPI: Karen Velasquez is a 87 y.o. female presenting on 07/31/2020 for comprehensive medical examination. Current medical complaints include:  HYPERTENSION / HYPERLIPIDEMIA Satisfied with current treatment? no Duration of hypertension: chronic BP monitoring frequency: not checking BP medication side effects: no Past BP meds: lisinopril, HCTZ Duration of hyperlipidemia: chronic Cholesterol medication side effects: no Cholesterol supplements: none Past cholesterol medications: fenofibrate, zetia Medication compliance: excellent compliance Aspirin: no Recent stressors: no Recurrent headaches: no Visual changes: no Palpitations: no Dyspnea: no Chest pain: no Lower extremity edema: no Dizzy/lightheaded: no  COPD COPD status: controlled Satisfied with current treatment?: yes Oxygen use: no Dyspnea frequency:  Cough frequency:  Rescue inhaler frequency:   Limitation of activity: no Pneumovax: Up to Date Influenza: Up to Date  Menopausal Symptoms: yes- did not do well when she tried to come off the estradiol. Had severe hot flashes that are keeping her awake all night.   Functional Status Survey: Is the patient deaf or have difficulty hearing?: Yes Does the patient have difficulty seeing, even when wearing glasses/contacts?: Yes Does the patient have difficulty concentrating, remembering, or making decisions?: Yes Does the patient have difficulty walking or climbing stairs?: Yes Does the patient have difficulty dressing or bathing?: No Does the patient have difficulty doing errands alone such as visiting a doctor's office or shopping?: No  Fall Risk  07/31/2020 07/31/2020 07/31/2020 03/28/2019 09/27/2018  Falls in the  past year? 1 0 0 0 0  Comment - - - - -  Number falls in past yr: 1 0 0 0 0  Injury with Fall? 0 0 0 0 0  Risk for fall due to : History of fall(s) Impaired balance/gait No Fall Risks - -  Follow up - Falls evaluation completed Falls evaluation completed - -    Depression Screen Depression screen [facility] 2/9 07/31/2020 03/28/2019 03/22/2018 02/24/2017 02/25/2016  Decreased Interest 0 0 0 0 0  Down, Depressed, Hopeless 0 0 0 0 0  PHQ - 2 Score 0 0 0 0 0  Altered sleeping - 0 - 0 -  Tired, decreased energy - 0 - 1 -  Change in appetite - 0 - 0 -  Feeling bad or failure about yourself  - 0 - 0 -  Trouble concentrating - 0 - 0 -  Moving slowly or fidgety/restless - 0 - 0 -  Suicidal thoughts - 0 - 0 -  PHQ-9 Score - 0 - 1 -    Advanced Directives Does patient have a HCPOA?    no Does patient have a living will or MOST form?  no  Past Medical History:  Past Medical History:  Diagnosis Date  . COPD (chronic obstructive pulmonary disease) ([place])   . Hyperlipidemia   . Hypertension     Surgical History:  Past Surgical History:  Procedure Laterality Date  . ABDOMINAL HYSTERECTOMY    . APPENDECTOMY     2105  . cornea implant     3,4 yrs ago  . PARTIAL HYSTERECTOMY     21yrs of age  . ROTATOR CUFF REPAIR     64yr  ago  . TONSILLECTOMY     as a child  . X-STOP IMPLANTATION N/A    44yrs ago    Medications:  Current Outpatient Medications on File Prior to Visit  Medication Sig  . Calcium Carbonate-Vitamin D 600-400 MG-UNIT per tablet Take 1 tablet by mouth daily.   . cetirizine (ZYRTEC) 10 MG tablet TAKE 1 TABLET DAILY  . ezetimibe (ZETIA) 10 MG tablet TAKE 1 TABLET DAILY  . fluticasone (FLONASE) 50 MCG/ACT nasal spray Place 2 sprays into both nostrils 2 (two) times daily.  . naftifine (NAFTIN) 1 % cream Apply 1 application topically as needed.  Marland Kitchen Spacer/Aero-Holding Chambers DEVI Use the spacer with the albuterol inhaler to help deliver the puffs.  . triamcinolone ointment  (KENALOG) 0.5 % Apply 1 application topically 2 (two) times daily.  . vitamin C (ASCORBIC ACID) 500 MG tablet Take by mouth.  . diclofenac Sodium (VOLTAREN) 1 % GEL Voltaren 1 % topical gel  APPLY 2 GRAMS TO THE AFFECTED AREA(S) BY TOPICAL ROUTE 4 TIMES PER DAY (Patient not taking: Reported on 07/31/2020)  . EXTRA STRENGTH ACETAMINOPHEN PO Take 1 tablet by mouth every 6 (six) hours as needed. (Patient not taking: Reported on 07/31/2020)  . hydrocortisone (ANUSOL-HC) 2.5 % rectal cream Place 1 application rectally 2 (two) times daily. (Patient not taking: Reported on 07/31/2020)  . naproxen (NAPROSYN) 375 MG tablet Take 1 tablet (375 mg total) by mouth 3 (three) times daily with meals. (Patient not taking: Reported on 07/31/2020)  . prednisoLONE acetate (PRED FORTE) 1 % ophthalmic suspension SMARTSIG:In Eye(s)   No current facility-administered medications on file prior to visit.    Allergies:  No Known Allergies  Social History:  Social History   Socioeconomic History  . Marital status: Widowed    Spouse name: Not on file  . Number of children: 1  . Years of education: Not on file  . Highest education level: Not on file  Occupational History  . Occupation: retired  Tobacco Use  . Smoking status: Former Smoker    Packs/day: 0.50    Types: Cigarettes    Quit date: 08/25/2016    Years since quitting: 3.9  . Smokeless tobacco: Never Used  Vaping Use  . Vaping Use: Never used  Substance and Sexual Activity  . Alcohol use: No    Alcohol/week: 0.0 standard drinks  . Drug use: No  . Sexual activity: Not Currently  Other Topics Concern  . Not on file  Social History Narrative  . Not on file   Social Determinants of Health   Financial Resource Strain: Not on file  Food Insecurity: Not on file  Transportation Needs: Not on file  Physical Activity: Not on file  Stress: Not on file  Social Connections: Not on file  Intimate Partner Violence: Not on file   Social History    Tobacco Use  Smoking Status Former Smoker  . Packs/day: 0.50  . Types: Cigarettes  . Quit date: 08/25/2016  . Years since quitting: 3.9  Smokeless Tobacco Never Used   Social History   Substance and Sexual Activity  Alcohol Use No  . Alcohol/week: 0.0 standard drinks    Family History:  Family History  Problem Relation Age of Onset  . Diabetes Father   . Cancer Father   . Cancer Sister     Past medical history, surgical history, medications, allergies, family history and social history reviewed with patient today and changes made to appropriate areas of the chart.  Review of Systems  Constitutional: Positive for diaphoresis. Negative for chills, fever, malaise/fatigue and weight loss.  HENT: Positive for hearing loss. Negative for congestion, ear discharge, ear pain, nosebleeds, sinus pain, sore throat and tinnitus.   Eyes: Positive for blurred vision. Negative for double vision, photophobia, pain, discharge and redness.  Respiratory: Positive for cough. Negative for hemoptysis, sputum production, shortness of breath, wheezing and stridor.   Cardiovascular: Negative.   Gastrointestinal: Positive for constipation. Negative for abdominal pain, blood in stool, diarrhea, heartburn, melena, nausea and vomiting.  Genitourinary: Negative.   Musculoskeletal: Positive for back pain and myalgias. Negative for falls, joint pain and neck pain.  Skin: Negative.   Neurological: Negative.   Endo/Heme/Allergies: Negative.   Psychiatric/Behavioral: Negative.     All other ROS negative except what is listed above and in the HPI.      Objective:    BP 134/74   Pulse 93   Temp 98.4 F (36.9 C)   Ht 5\' 1"  (1.549 m)   Wt 146 lb 12.8 oz (66.6 kg)   LMP 11/28/1971 (Approximate)   SpO2 98%   BMI 27.74 kg/m   Wt Readings from Last 3 Encounters:  07/31/20 146 lb 12.8 oz (66.6 kg)  01/20/20 145 lb 12.8 oz (66.1 kg)  04/29/19 140 lb (63.5 kg)    Physical Exam Vitals and nursing  note reviewed.  Constitutional:      General: She is not in acute distress.    Appearance: Normal appearance. She is not ill-appearing, toxic-appearing or diaphoretic.  HENT:     Head: Normocephalic and atraumatic.     Right Ear: External ear normal.     Left Ear: External ear normal.     Nose: Nose normal.     Mouth/Throat:     Mouth: Mucous membranes are moist.     Pharynx: Oropharynx is clear.  Eyes:     General: No scleral icterus.       Right eye: No discharge.        Left eye: No discharge.     Extraocular Movements: Extraocular movements intact.     Conjunctiva/sclera: Conjunctivae normal.     Pupils: Pupils are equal, round, and reactive to light.  Cardiovascular:     Rate and Rhythm: Normal rate and regular rhythm.     Pulses: Normal pulses.     Heart sounds: Normal heart sounds. No murmur heard. No friction rub. No gallop.   Pulmonary:     Effort: Pulmonary effort is normal. No respiratory distress.     Breath sounds: Normal breath sounds. No stridor. No wheezing, rhonchi or rales.  Chest:     Chest wall: No tenderness.  Musculoskeletal:        General: Normal range of motion.     Cervical back: Normal range of motion and neck supple.  Skin:    General: Skin is warm and dry.     Capillary Refill: Capillary refill takes less than 2 seconds.     Coloration: Skin is not jaundiced or pale.     Findings: No bruising, erythema, lesion or rash.  Neurological:     General: No focal deficit present.     Mental Status: She is alert and oriented to person, place, and time. Mental status is at baseline.  Psychiatric:        Mood and Affect: Mood normal.        Behavior: Behavior normal.        Thought Content: Thought content normal.  Judgment: Judgment normal.     6CIT Screen 07/31/2020 03/28/2019 03/22/2018  What Year? 0 points 0 points 0 points  What month? 0 points 0 points 0 points  What time? 0 points 0 points 0 points  Count back from 20 0 points 0 points  0 points  Months in reverse 0 points 0 points 0 points  Repeat phrase 0 points 0 points 0 points  Total Score 0 0 0     Results for orders placed or performed in visit on 01/20/20  Comprehensive metabolic panel  Result Value Ref Range   Glucose 97 65 - 99 mg/dL   BUN 26 8 - 27 mg/dL   Creatinine, Ser 1.05 (H) 0.57 - 1.00 mg/dL   GFR calc non Af Amer 48 (L) >59 mL/min/1.73   GFR calc Af Amer 55 (L) >59 mL/min/1.73   BUN/Creatinine Ratio 25 12 - 28   Sodium 142 134 - 144 mmol/L   Potassium 4.7 3.5 - 5.2 mmol/L   Chloride 105 96 - 106 mmol/L   CO2 25 20 - 29 mmol/L   Calcium 9.6 8.7 - 10.3 mg/dL   Total Protein 6.8 6.0 - 8.5 g/dL   Albumin 4.5 3.6 - 4.6 g/dL   Globulin, Total 2.3 1.5 - 4.5 g/dL   Albumin/Globulin Ratio 2.0 1.2 - 2.2   Bilirubin Total 0.4 0.0 - 1.2 mg/dL   Alkaline Phosphatase 44 (L) 48 - 121 IU/L   AST 20 0 - 40 IU/L   ALT 17 0 - 32 IU/L  Lipid Panel w/o Chol/HDL Ratio  Result Value Ref Range   Cholesterol, Total 153 100 - 199 mg/dL   Triglycerides 77 0 - 149 mg/dL   HDL 61 >39 mg/dL   VLDL Cholesterol Cal 15 5 - 40 mg/dL   LDL Chol Calc (NIH) 77 0 - 99 mg/dL  Microalbumin, Urine Waived  Result Value Ref Range   Microalb, Ur Waived 10 0 - 19 mg/L   Creatinine, Urine Waived 100 10 - 300 mg/dL   Microalb/Creat Ratio <30 <30 mg/g      Assessment & Plan:   Problem List Items Addressed This Visit      Respiratory   COPD (chronic obstructive pulmonary disease) ([place])    Under good control on current regimen. Continue current regimen. Continue to monitor. Call with any concerns. Refills given. Labs drawn today.        Relevant Medications   Fluticasone-Salmeterol (WIXELA INHUB) 250-50 MCG/DOSE AEPB   albuterol (VENTOLIN HFA) 108 (90 Base) MCG/ACT inhaler   Other Relevant Orders   CBC with Differential/Platelet   Comprehensive metabolic panel     Digestive   GERD (gastroesophageal reflux disease)    Under good control on current regimen. Continue  current regimen. Continue to monitor. Call with any concerns. Refills given. Labs drawn today.        Relevant Medications   esomeprazole (NEXIUM) 40 MG capsule   Other Relevant Orders   CBC with Differential/Platelet   Comprehensive metabolic panel     Genitourinary   Benign hypertensive renal disease    Under good control on current regimen. Continue current regimen. Continue to monitor. Call with any concerns. Refills given. Labs drawn today.        Relevant Orders   CBC with Differential/Platelet   Comprehensive metabolic panel   Microalbumin, Urine Waived   TSH   CKD (chronic kidney disease) stage 3, GFR 30-59 ml/min (HCC)    Rechecking labs today. Await results. Treat  as needed.       Relevant Orders   CBC with Differential/Platelet   Comprehensive metabolic panel   Microalbumin, Urine Waived   Urinalysis, Routine w reflex microscopic     Other   Hyperlipidemia    Under good control on current regimen. Continue current regimen. Continue to monitor. Call with any concerns. Refills given. Labs drawn today.        Relevant Medications   lisinopril-hydrochlorothiazide (ZESTORETIC) 10-12.5 MG tablet   fenofibrate micronized (ANTARA) 130 MG capsule   Other Relevant Orders   CBC with Differential/Platelet   Comprehensive metabolic panel   Lipid Panel w/o Chol/HDL Ratio    Other Visit Diagnoses    Wellness examination    -  Primary   Preventative care discussed today as below.    Postmenopausal estrogen deficiency       DEXA ordered today.   Relevant Orders   DG Bone Density       Preventative Services:  Health Risk Assessment and Personalized Prevention Plan: Done Bone Mass Measurements: Ordered today Breast Cancer Screening: N/A CVD Screening: Done today Cervical Cancer Screening: N/A Colon Cancer Screening: N/A Depression Screening: Done today Diabetes Screening: Done today Glaucoma Screening: See your eye doctor Hepatitis B vaccine: N/A Hepatitis C  screening: Up to date HIV Screening: Up to date Flu Vaccine: Up to date Lung cancer Screening: N/A Obesity Screening: Done today Pneumonia Vaccines (2): Up to date STI Screening: N/A  Follow up plan: Return in about 6 months (around 01/28/2021).   LABORATORY TESTING:  - Pap smear: not applicable  IMMUNIZATIONS:   - Tdap: Tetanus vaccination status reviewed: last tetanus booster within 10 years. - Influenza: Up to date - Pneumovax: Up to date - Prevnar: Up to date  SCREENING: -Mammogram: Not applicable  - Colonoscopy: Not applicable  - Bone Density: Ordered today   PATIENT COUNSELING:   Advised to take 1 mg of folate supplement per day if capable of pregnancy.   Sexuality: Discussed sexually transmitted diseases, partner selection, use of condoms, avoidance of unintended pregnancy  and contraceptive alternatives.   Advised to avoid cigarette smoking.  I discussed with the patient that most people either abstain from alcohol or drink within safe limits (<=14/week and <=4 drinks/occasion for males, <=7/weeks and <= 3 drinks/occasion for females) and that the risk for alcohol disorders and other health effects rises proportionally with the number of drinks per week and how often a drinker exceeds daily limits.  Discussed cessation/primary prevention of drug use and availability of treatment for abuse.   Diet: Encouraged to adjust caloric intake to maintain  or achieve ideal body weight, to reduce intake of dietary saturated fat and total fat, to limit sodium intake by avoiding high sodium foods and not adding table salt, and to maintain adequate dietary potassium and calcium preferably from fresh fruits, vegetables, and low-fat dairy products.    stressed the importance of regular exercise  Injury prevention: Discussed safety belts, safety helmets, smoke detector, smoking near bedding or upholstery.   Dental health: Discussed importance of regular tooth brushing, flossing, and  dental visits.    NEXT PREVENTATIVE PHYSICAL DUE IN 1 YEAR. Return in about 6 months (around 01/28/2021).

## 2020-07-31 NOTE — Patient Instructions (Addendum)
Preventative Services:  Health Risk Assessment and Personalized Prevention Plan: Done Bone Mass Measurements: Ordered today Breast Cancer Screening: N/A CVD Screening: Done today Cervical Cancer Screening: N/A Colon Cancer Screening: N/A Depression Screening: Done today Diabetes Screening: Done today Glaucoma Screening: See your eye doctor Hepatitis B vaccine: N/A Hepatitis C screening: Up to date HIV Screening: Up to date Flu Vaccine: Up to date Lung cancer Screening: N/A Obesity Screening: Done today Pneumonia Vaccines (2): Up to date STI Screening: N/A  Call to schedule your Bone Density: [facility]  Address: [address], [place], [place] [address]  Phone: [phone]   Health Maintenance After Age 75 After age 43, you are at a higher risk for certain long-term diseases and infections as well as injuries from falls. Falls are a major cause of broken bones and head injuries in people who are older than age 54. Getting regular preventive care can help to keep you healthy and well. Preventive care includes getting regular testing and making lifestyle changes as recommended by your health care provider. Talk with your health care provider about:  Which screenings and tests you should have. A screening is a test that checks for a disease when you have no symptoms.  A diet and exercise plan that is right for you. What should I know about screenings and tests to prevent falls? Screening and testing are the best ways to find a health problem early. Early diagnosis and treatment give you the best chance of managing medical conditions that are common after age 28. Certain conditions and lifestyle choices may make you more likely to have a fall. Your health care provider may recommend:  Regular vision checks. Poor vision and conditions such as cataracts can make you more likely to have a fall. If you wear glasses, make sure to get your  prescription updated if your vision changes.  Medicine review. Work with your health care provider to regularly review all of the medicines you are taking, including over-the-counter medicines. Ask your health care provider about any side effects that may make you more likely to have a fall. Tell your health care provider if any medicines that you take make you feel dizzy or sleepy.  Osteoporosis screening. Osteoporosis is a condition that causes the bones to get weaker. This can make the bones weak and cause them to break more easily.  Blood pressure screening. Blood pressure changes and medicines to control blood pressure can make you feel dizzy.  Strength and balance checks. Your health care provider may recommend certain tests to check your strength and balance while standing, walking, or changing positions.  Foot health exam. Foot pain and numbness, as well as not wearing proper footwear, can make you more likely to have a fall.  Depression screening. You may be more likely to have a fall if you have a fear of falling, feel emotionally low, or feel unable to do activities that you used to do.  Alcohol use screening. Using too much alcohol can affect your balance and may make you more likely to have a fall. What actions can I take to lower my risk of falls? General instructions  Talk with your health care provider about your risks for falling. Tell your health care provider if: ? You fall. Be sure to tell your health care provider about all falls, even ones that seem minor. ? You feel dizzy, sleepy, or off-balance.  Take over-the-counter and prescription medicines only as told by  your health care provider. These include any supplements.  Eat a healthy diet and maintain a healthy weight. A healthy diet includes low-fat dairy products, low-fat (lean) meats, and fiber from whole grains, beans, and lots of fruits and vegetables. Home safety  Remove any tripping hazards, such as rugs,  cords, and clutter.  Install safety equipment such as grab bars in bathrooms and safety rails on stairs.  Keep rooms and walkways well-lit. Activity  Follow a regular exercise program to stay fit. This will help you maintain your balance. Ask your health care provider what types of exercise are appropriate for you.  If you need a cane or walker, use it as recommended by your health care provider.  Wear supportive shoes that have nonskid soles.   Lifestyle  Do not drink alcohol if your health care provider tells you not to drink.  If you drink alcohol, limit how much you have: ? 0-1 drink a day for women. ? 0-2 drinks a day for men.  Be aware of how much alcohol is in your drink. In the [place] one drink equals one typical bottle of beer (12 oz), one-half glass of wine (5 oz), or one shot of hard liquor (1 oz).  Do not use any products that contain nicotine or tobacco, such as cigarettes and e-cigarettes. If you need help quitting, ask your health care provider. Summary  Having a healthy lifestyle and getting preventive care can help to protect your health and wellness after age 48.  Screening and testing are the best way to find a health problem early and help you avoid having a fall. Early diagnosis and treatment give you the best chance for managing medical conditions that are more common for people who are older than age 71.  Falls are a major cause of broken bones and head injuries in people who are older than age 29. Take precautions to prevent a fall at home.  Work with your health care provider to learn what changes you can make to improve your health and wellness and to prevent falls. This information is not intended to replace advice given to you by your health care provider. Make sure you discuss any questions you have with your health care provider. Document Revised: 09/16/2018 Document Reviewed: 04/08/2017 Elsevier Patient Education  2021 [employer].

## 2020-08-01 LAB — COMPREHENSIVE METABOLIC PANEL
ALT: 15 IU/L (ref 0–32)
AST: 18 IU/L (ref 0–40)
Albumin/Globulin Ratio: 2.1 (ref 1.2–2.2)
Albumin: 4.5 g/dL (ref 3.6–4.6)
Alkaline Phosphatase: 40 IU/L — ABNORMAL LOW (ref 44–121)
BUN/Creatinine Ratio: 31 — ABNORMAL HIGH (ref 12–28)
BUN: 33 mg/dL — ABNORMAL HIGH (ref 8–27)
Bilirubin Total: 0.3 mg/dL (ref 0.0–1.2)
CO2: 22 mmol/L (ref 20–29)
Calcium: 9.6 mg/dL (ref 8.7–10.3)
Chloride: 106 mmol/L (ref 96–106)
Creatinine, Ser: 1.06 mg/dL — ABNORMAL HIGH (ref 0.57–1.00)
GFR calc Af Amer: 55 mL/min/{1.73_m2} — ABNORMAL LOW (ref >59)
GFR calc non Af Amer: 47 mL/min/{1.73_m2} — ABNORMAL LOW (ref >59)
Globulin, Total: 2.1 g/dL (ref 1.5–4.5)
Glucose: 102 mg/dL — ABNORMAL HIGH (ref 65–99)
Potassium: 4.6 mmol/L (ref 3.5–5.2)
Sodium: 142 mmol/L (ref 134–144)
Total Protein: 6.6 g/dL (ref 6.0–8.5)

## 2020-08-01 LAB — CBC WITH DIFFERENTIAL/PLATELET
Basophils Absolute: 0 10*3/uL (ref 0.0–0.2)
Basos: 1 %
EOS (ABSOLUTE): 0.1 10*3/uL (ref 0.0–0.4)
Eos: 2 %
Hematocrit: 34 % (ref 34.0–46.6)
Hemoglobin: 11.1 g/dL (ref 11.1–15.9)
Immature Grans (Abs): 0 10*3/uL (ref 0.0–0.1)
Immature Granulocytes: 0 %
Lymphocytes Absolute: 1.2 10*3/uL (ref 0.7–3.1)
Lymphs: 22 %
MCH: 29.3 pg (ref 26.6–33.0)
MCHC: 32.6 g/dL (ref 31.5–35.7)
MCV: 90 fL (ref 79–97)
Monocytes Absolute: 0.4 10*3/uL (ref 0.1–0.9)
Monocytes: 7 %
Neutrophils Absolute: 3.5 10*3/uL (ref 1.4–7.0)
Neutrophils: 68 %
Platelets: 208 10*3/uL (ref 150–450)
RBC: 3.79 x10E6/uL (ref 3.77–5.28)
RDW: 13.5 % (ref 11.7–15.4)
WBC: 5.2 10*3/uL (ref 3.4–10.8)

## 2020-08-01 LAB — LIPID PANEL W/O CHOL/HDL RATIO
Cholesterol, Total: 151 mg/dL (ref 100–199)
HDL: 62 mg/dL (ref >39)
LDL Chol Calc (NIH): 77 mg/dL (ref 0–99)
Triglycerides: 60 mg/dL (ref 0–149)
VLDL Cholesterol Cal: 12 mg/dL (ref 5–40)

## 2020-08-01 LAB — TSH: TSH: 0.88 u[IU]/mL (ref 0.450–4.500)

## 2020-08-02 ENCOUNTER — Other Ambulatory Visit: Payer: Self-pay | Admitting: Internal Medicine

## 2020-08-03 ENCOUNTER — Encounter: Payer: Self-pay | Admitting: Family Medicine

## 2020-08-14 ENCOUNTER — Ambulatory Visit
Admission: RE | Admit: 2020-08-14 | Discharge: 2020-08-14 | Disposition: A | Discharge status: Home / Self Care | Payer: Medicare Other | Source: Ambulatory Visit | Attending: Family Medicine | Admitting: Family Medicine

## 2020-08-14 ENCOUNTER — Other Ambulatory Visit: Payer: Self-pay

## 2020-08-14 DIAGNOSIS — Z780 Asymptomatic menopausal state: Secondary | ICD-10-CM | POA: Insufficient documentation

## 2020-08-16 ENCOUNTER — Telehealth: Payer: Self-pay

## 2020-08-16 NOTE — Telephone Encounter (Signed)
Copied from [place] [id number]. Topic: General - Inquiry >> Aug 16, 2020 10:43 AM Greggory Keen D wrote: Reason for CRM: Pt called saying she never got her rx of the [facility].  She is wanting to know if the office can check on that for her.  She said [facility] and they told her she needs to call the office for a new rx.  CB#  [phone]

## 2020-08-16 NOTE — Telephone Encounter (Signed)
RX called in verbally to [facility] for the patient.   Called and let her know this was done for her.

## 2020-08-17 ENCOUNTER — Encounter: Payer: Self-pay | Admitting: Family Medicine

## 2020-08-17 DIAGNOSIS — M8580 Other specified disorders of bone density and structure, unspecified site: Secondary | ICD-10-CM | POA: Insufficient documentation

## 2020-09-12 ENCOUNTER — Other Ambulatory Visit: Payer: Self-pay

## 2020-09-12 ENCOUNTER — Ambulatory Visit (INDEPENDENT_AMBULATORY_CARE_PROVIDER_SITE_OTHER): Payer: Medicare Other | Admitting: Podiatry

## 2020-09-12 ENCOUNTER — Encounter: Payer: Self-pay | Admitting: Podiatry

## 2020-09-12 ENCOUNTER — Ambulatory Visit (INDEPENDENT_AMBULATORY_CARE_PROVIDER_SITE_OTHER): Payer: Medicare Other

## 2020-09-12 DIAGNOSIS — M2042 Other hammer toe(s) (acquired), left foot: Secondary | ICD-10-CM | POA: Diagnosis not present

## 2020-09-12 DIAGNOSIS — B351 Tinea unguium: Secondary | ICD-10-CM

## 2020-09-12 DIAGNOSIS — D2372 Other benign neoplasm of skin of left lower limb, including hip: Secondary | ICD-10-CM

## 2020-09-12 DIAGNOSIS — M79676 Pain in unspecified toe(s): Secondary | ICD-10-CM | POA: Diagnosis not present

## 2020-09-12 DIAGNOSIS — M778 Other enthesopathies, not elsewhere classified: Secondary | ICD-10-CM

## 2020-09-12 NOTE — Progress Notes (Signed)
Subjective:  Patient ID: Karen Velasquez, female    DOB: Jul 10, 1930,  MRN: [id number] HPI Chief Complaint  Patient presents with  . Toe Pain    2nd toe left - aching x 1 year, callused area between the toes rubbing the big toe, also tip of toe is callused, toe slightly curled, toenails thick and discolored  . New Patient (Initial Visit)    87 y.o. female presents with the above complaint.   ROS: Denies fever chills nausea vomiting muscle aches pains calf pain back pain chest pain shortness of breath.  Past Medical History:  Diagnosis Date  . COPD (chronic obstructive pulmonary disease) ([place])   . Hyperlipidemia   . Hypertension    Past Surgical History:  Procedure Laterality Date  . ABDOMINAL HYSTERECTOMY    . APPENDECTOMY     2105  . cornea implant     3,4 yrs ago  . PARTIAL HYSTERECTOMY     60yrs of age  . ROTATOR CUFF REPAIR     29yr ago  . TONSILLECTOMY     as a child  . X-STOP IMPLANTATION N/A    41yrs ago    Current Outpatient Medications:  .  albuterol (VENTOLIN HFA) 108 (90 Base) MCG/ACT inhaler, Inhale 4 puffs into the lungs every 6 (six) hours as needed for wheezing or shortness of breath., Disp: 18 g, Rfl: 1 .  Calcium Carbonate-Vitamin D 600-400 MG-UNIT per tablet, Take 1 tablet by mouth daily. , Disp: , Rfl:  .  cetirizine (ZYRTEC) 10 MG tablet, TAKE 1 TABLET DAILY, Disp: 90 tablet, Rfl: 3 .  diclofenac Sodium (VOLTAREN) 1 % GEL, Voltaren 1 % topical gel  APPLY 2 GRAMS TO THE AFFECTED AREA(S) BY TOPICAL ROUTE 4 TIMES PER DAY (Patient not taking: Reported on 07/31/2020), Disp: , Rfl:  .  esomeprazole (NEXIUM) 40 MG capsule, Take 1 capsule (40 mg total) by mouth daily at 12 noon., Disp: 90 capsule, Rfl: 1 .  estradiol (ESTRACE) 0.5 MG tablet, Take 0.5 tablets (0.25 mg total) by mouth daily., Disp: 45 tablet, Rfl: 1 .  EXTRA STRENGTH ACETAMINOPHEN PO, Take 1 tablet by mouth every 6 (six) hours as needed. (Patient not taking: Reported on 07/31/2020), Disp: , Rfl:  .   ezetimibe (ZETIA) 10 MG tablet, TAKE 1 TABLET DAILY, Disp: 90 tablet, Rfl: 3 .  fenofibrate micronized (ANTARA) 130 MG capsule, Take 1 capsule (130 mg total) by mouth daily before breakfast., Disp: 90 capsule, Rfl: 1 .  fluticasone (FLONASE) 50 MCG/ACT nasal spray, Place 2 sprays into both nostrils 2 (two) times daily., Disp: 48 g, Rfl: 4 .  Fluticasone-Salmeterol (WIXELA INHUB) 250-50 MCG/DOSE AEPB, Inhale 1 puff into the lungs 2 (two) times daily., Disp: 180 each, Rfl: 1 .  hydrocortisone (ANUSOL-HC) 2.5 % rectal cream, Place 1 application rectally 2 (two) times daily. (Patient not taking: Reported on 07/31/2020), Disp: 60 g, Rfl: 0 .  lisinopril-hydrochlorothiazide (ZESTORETIC) 10-12.5 MG tablet, Take 1 tablet by mouth daily., Disp: 90 tablet, Rfl: 1 .  naftifine (NAFTIN) 1 % cream, Apply 1 application topically as needed., Disp: 30 g, Rfl: 12 .  naproxen (NAPROSYN) 375 MG tablet, Take 1 tablet (375 mg total) by mouth 3 (three) times daily with meals. (Patient not taking: Reported on 07/31/2020), Disp: 270 tablet, Rfl: 1 .  prednisoLONE acetate (PRED FORTE) 1 % ophthalmic suspension, SMARTSIG:In Eye(s), Disp: , Rfl:  .  Spacer/Aero-Holding Chambers DEVI, Use the spacer with the albuterol inhaler to help deliver the puffs., Disp: 1  Device, Rfl: 0 .  triamcinolone ointment (KENALOG) 0.5 %, Apply 1 application topically 2 (two) times daily., Disp: 90 g, Rfl: 0 .  vitamin C (ASCORBIC ACID) 500 MG tablet, Take by mouth., Disp: , Rfl:   No Known Allergies Review of Systems Objective:  There were no vitals filed for this visit.  General: Well developed, nourished, in no acute distress, alert and oriented x3   Dermatological: Skin is warm, dry and supple bilateral. Nails x 10 are thick yellow dystrophic and clinically mycotic painful palpation as well as the debridement; remaining integument appears unremarkable at this time. There are no open sores, no preulcerative lesions, no rash or signs of  infection present.  Porokeratotic lesion to the medial aspect of the second digit and the lateral aspect of the first.  These are benign skin lesions.  No open lesions or wounds  Vascular: Dorsalis Pedis artery and Posterior Tibial artery pedal pulses are 2/4 bilateral with immedate capillary fill time. Pedal hair growth present. No varicosities and no lower extremity edema present bilateral.   Neruologic: Grossly intact via light touch bilateral. Vibratory intact via tuning fork bilateral. Protective threshold with Semmes Wienstein monofilament intact to all pedal sites bilateral. Patellar and Achilles deep tendon reflexes 2+ bilateral. No Babinski or clonus noted bilateral.   Musculoskeletal: No gross boney pedal deformities bilateral. No pain, crepitus, or limitation noted with foot and ankle range of motion bilateral. Muscular strength 5/5 in all groups tested bilateral.  Mild hammertoe deformity is noted 2 through 5 of her left foot.  Gait: Unassisted, Nonantalgic.    Radiographs:  Osteopenia with osteoarthritis to all of the toes 2 through 5.  Osteoarthritis midfoot.  Assessment & Plan:   Assessment: Discussed etiology pathology conservative versus surgical therapies at this point time benign skin lesion osteoarthritis and pain in limb secondary to onychomycosis.  Plan: Debrided benign skin lesion second digit left debrided toenails 1 through 5 bilateral.  We will follow-up with her for routine debridement.     Sudais Banghart T. [place], [place]

## 2020-09-14 ENCOUNTER — Other Ambulatory Visit: Payer: Self-pay | Admitting: Family Medicine

## 2020-09-14 NOTE — Telephone Encounter (Signed)
Future visit in 4 months 

## 2020-11-08 ENCOUNTER — Ambulatory Visit (INDEPENDENT_AMBULATORY_CARE_PROVIDER_SITE_OTHER): Payer: Medicare Other | Admitting: Family Medicine

## 2020-11-08 ENCOUNTER — Ambulatory Visit: Payer: Self-pay | Admitting: *Deleted

## 2020-11-08 ENCOUNTER — Ambulatory Visit
Admission: RE | Admit: 2020-11-08 | Discharge: 2020-11-08 | Disposition: A | Discharge status: Home / Self Care | Payer: Medicare Other | Source: Ambulatory Visit | Attending: Family Medicine | Admitting: Family Medicine

## 2020-11-08 ENCOUNTER — Telehealth: Payer: PRIVATE HEALTH INSURANCE | Admitting: Family Medicine

## 2020-11-08 ENCOUNTER — Encounter: Payer: Self-pay | Admitting: Family Medicine

## 2020-11-08 ENCOUNTER — Other Ambulatory Visit: Payer: Self-pay

## 2020-11-08 ENCOUNTER — Other Ambulatory Visit: Payer: Self-pay | Admitting: Family Medicine

## 2020-11-08 VITALS — Temp 98.0°F | Wt 146.0 lb

## 2020-11-08 DIAGNOSIS — R269 Unspecified abnormalities of gait and mobility: Secondary | ICD-10-CM | POA: Insufficient documentation

## 2020-11-08 DIAGNOSIS — R42 Dizziness and giddiness: Secondary | ICD-10-CM | POA: Insufficient documentation

## 2020-11-08 DIAGNOSIS — R29810 Facial weakness: Secondary | ICD-10-CM | POA: Diagnosis not present

## 2020-11-08 DIAGNOSIS — R5383 Other fatigue: Secondary | ICD-10-CM | POA: Diagnosis not present

## 2020-11-08 MED ORDER — MECLIZINE HCL 25 MG PO TABS: 25.0000 mg | ORAL_TABLET | Freq: Three times a day (TID) | ORAL | 0 refills | Status: AC | PRN

## 2020-11-08 NOTE — Telephone Encounter (Signed)
Needs to be in office

## 2020-11-08 NOTE — Telephone Encounter (Addendum)
Pt called in c/o sudden dizziness that started at 7-7:30 this morning while getting dressed to go play bridge.   She got up at 5:00 and had breakfast and her usual morning.   She has had this happen before and was given medication for it.   She could not remember how long ago it has been but it's "been a long time ago".   "At my age time isn't very easy to remember". She denies any other symptoms:  Nausea, headache, visual changes, weakness or numbness tingling on one side of her body in an arm or leg, speech is clear and appropriate, she is ambulating around the house with her walker which she usually uses anyway or a cane but doesn't feel safe enough to drive anywhere, denies any facial droopiness or numbness/tingling.  "I just need medication called in for dizziness like they did before".  She does not have a computer so I set her up a virtual visit at 10:00 this morning with Dr. Park Liter since she doesn't have a way into the office.   Protocol is to be seen within 24 hrs.  I sent my notes to [facility] for Dr. Park Liter.  I called the office and let them know I had made this appt for 10:00 this morning.   They are going to check with Dr. Wynetta Emery and see if she prefers to see her in the office and contact the pt if necessary.   Reason for Disposition . [1] MODERATE dizziness (e.g., interferes with normal activities) AND [2] has NOT been evaluated by physician for this  (Exception: dizziness caused by heat exposure, sudden standing, or poor fluid intake)  Answer Assessment - Initial Assessment Questions 1. DESCRIPTION: "Describe your dizziness."     I was fine this morning.   All of a sudden I got dizzy.   I've had this happen a long time ago.   I was just walking through the house and it hit.   I was getting ready to go play bridge.   I'm a little dizzy now.   I would not go out because it's still there a little.   They don't know why it happened before it's been a long  time ago.   I don't feel safe walking. 2. LIGHTHEADED: "Do you feel lightheaded?" (e.g., somewhat faint, woozy, weak upon standing)     Not lightheaded.   3. VERTIGO: "Do you feel like either you or the room is spinning or tilting?" (i.e. vertigo)     No 4. SEVERITY: "How bad is it?"  "Do you feel like you are going to faint?" "Can you stand and walk?"   - MILD: Feels slightly dizzy, but walking normally.   - MODERATE: Feels unsteady when walking, but not falling; interferes with normal activities (e.g., school, work).   - SEVERE: Unable to walk without falling, or requires assistance to walk without falling; feels like passing out now.      No visual changes.     Moderate   I use a walker anyway when I'm walking.   I don't have to use it all the time.   I use a cane too.     I'm standing here leaning against the wall right now and feel fine except a little dizziness. I got up and had my usual coffee and peanut butter crackers.   I was dressing when it hit.   5. ONSET:  "When did the dizziness begin?"  This morning about 7-7:30.     I got up at 5:00.   I'm an early bird. 6. AGGRAVATING FACTORS: "Does anything make it worse?" (e.g., standing, change in head position)     No 7. HEART RATE: "Can you tell me your heart rate?" "How many beats in 15 seconds?"  (Note: not all patients can do this)       Not asked Denies chest pain or shortness of breath. I've had this dizziness before but can't remember when it was.  At my age I can't remember time  That well.   8. CAUSE: "What do you think is causing the dizziness?"     No  I've been fine 9. RECURRENT SYMPTOM: "Have you had dizziness before?" If Yes, ask: "When was the last time?" "What happened that time?"     Yes a long time ago.   Had medication for it. I've been fine. 10. OTHER SYMPTOMS: "Do you have any other symptoms?" (e.g., fever, chest pain, vomiting, diarrhea, bleeding)       None 11. PREGNANCY: "Is there any chance you are  pregnant?" "When was your last menstrual period?"       N/A  Protocols used: DIZZINESS [facility]

## 2020-11-08 NOTE — Progress Notes (Signed)
Temp 98 F (36.7 C)   Wt 146 lb (66.2 kg)   LMP 11/28/1971 (Approximate)   SpO2 98%   BMI 27.59 kg/m    Subjective:    Patient ID: Karen Velasquez, female    DOB: Dec 02, 1930, 87 y.o.   MRN: [id number]  HPI: Karen Velasquez is a 87 y.o. female  Chief Complaint  Patient presents with  . Dizziness    Patient states she started feeling dizzy this morning    DIZZINESS Duration: this AM Description of symptoms: lightheaded Duration of episode: constant Dizziness frequency: constant Provoking factors: none Aggravating factors:  none Triggered by rolling over in bed: no Triggered by bending over: no Aggravated by head movement: no Aggravated by exertion, coughing, loud noises: no Recent head injury: no Recent or current viral symptoms: no History of vasovagal episodes: no Nausea: yes Vomiting: no Tinnitus: yes- chronic Hearing loss: no Aural fullness: no Headache: no Photophobia/phonophobia: no Unsteady gait: yes- chronic, worse today Postural instability: yes- chronic Diplopia, dysarthria, dysphagia or weakness: no Related to exertion: no Pallor: no Diaphoresis: no Dyspnea: no Chest pain: no  Relevant past medical, surgical, family and social history reviewed and updated as indicated. Interim medical history since our last visit reviewed. Allergies and medications reviewed and updated.  Review of Systems  Constitutional: Negative.   Respiratory: Negative.   Cardiovascular: Negative.   Gastrointestinal: Negative.   Musculoskeletal: Positive for gait problem. Negative for arthralgias, back pain, joint swelling, myalgias, neck pain and neck stiffness.  Neurological: Positive for dizziness, weakness and light-headedness. Negative for tremors, seizures, syncope, facial asymmetry, speech difficulty, numbness and headaches.  Psychiatric/Behavioral: Negative.     Per HPI unless specifically indicated above     Objective:    Temp 98 F (36.7 C)   Wt 146 lb (66.2  kg)   LMP 11/28/1971 (Approximate)   SpO2 98%   BMI 27.59 kg/m   Wt Readings from Last 3 Encounters:  11/08/20 146 lb (66.2 kg)  07/31/20 146 lb 12.8 oz (66.6 kg)  01/20/20 145 lb 12.8 oz (66.1 kg)    Physical Exam Vitals and nursing note reviewed.  Constitutional:      General: She is not in acute distress.    Appearance: Normal appearance. She is not ill-appearing, toxic-appearing or diaphoretic.  HENT:     Head: Normocephalic and atraumatic.     Right Ear: External ear normal.     Left Ear: External ear normal.     Nose: Nose normal.     Mouth/Throat:     Mouth: Mucous membranes are moist.     Pharynx: Oropharynx is clear.  Eyes:     General: No scleral icterus.       Right eye: No discharge.        Left eye: No discharge.     Extraocular Movements: Extraocular movements intact.     Right eye: Normal extraocular motion and no nystagmus.     Left eye: Normal extraocular motion and no nystagmus.     Conjunctiva/sclera: Conjunctivae normal.     Pupils: Pupils are equal, round, and reactive to light.  Cardiovascular:     Rate and Rhythm: Normal rate and regular rhythm.     Pulses: Normal pulses.     Heart sounds: Normal heart sounds. No murmur heard. No friction rub. No gallop.   Pulmonary:     Effort: Pulmonary effort is normal. No respiratory distress.     Breath sounds: Normal breath sounds. No stridor. No  wheezing, rhonchi or rales.  Chest:     Chest wall: No tenderness.  Musculoskeletal:        General: Normal range of motion.     Cervical back: Normal range of motion and neck supple.  Skin:    General: Skin is warm and dry.     Capillary Refill: Capillary refill takes less than 2 seconds.     Coloration: Skin is not jaundiced or pale.     Findings: No bruising, erythema, lesion or rash.  Neurological:     Mental Status: She is alert and oriented to person, place, and time. Mental status is at baseline.     Cranial Nerves: No cranial nerve deficit.     Motor:  No weakness.     Gait: Gait abnormal.     Comments: Mild facial lag on R side, unclear if true facial droop  Psychiatric:        Mood and Affect: Mood normal.        Behavior: Behavior normal.        Thought Content: Thought content normal.        Judgment: Judgment normal.     Results for orders placed or performed in visit on 07/31/20  CBC with Differential/Platelet  Result Value Ref Range   WBC 5.2 3.4 - 10.8 x10E3/uL   RBC 3.79 3.77 - 5.28 x10E6/uL   Hemoglobin 11.1 11.1 - 15.9 g/dL   Hematocrit 34.0 34.0 - 46.6 %   MCV 90 79 - 97 fL   MCH 29.3 26.6 - 33.0 pg   MCHC 32.6 31.5 - 35.7 g/dL   RDW 13.5 11.7 - 15.4 %   Platelets 208 150 - 450 x10E3/uL   Neutrophils 68 Not Estab. %   Lymphs 22 Not Estab. %   Monocytes 7 Not Estab. %   Eos 2 Not Estab. %   Basos 1 Not Estab. %   Neutrophils Absolute 3.5 1.4 - 7.0 x10E3/uL   Lymphocytes Absolute 1.2 0.7 - 3.1 x10E3/uL   Monocytes Absolute 0.4 0.1 - 0.9 x10E3/uL   EOS (ABSOLUTE) 0.1 0.0 - 0.4 x10E3/uL   Basophils Absolute 0.0 0.0 - 0.2 x10E3/uL   Immature Granulocytes 0 Not Estab. %   Immature Grans (Abs) 0.0 0.0 - 0.1 x10E3/uL  Comprehensive metabolic panel  Result Value Ref Range   Glucose 102 (H) 65 - 99 mg/dL   BUN 33 (H) 8 - 27 mg/dL   Creatinine, Ser 1.06 (H) 0.57 - 1.00 mg/dL   GFR calc non Af Amer 47 (L) >59 mL/min/1.73   GFR calc Af Amer 55 (L) >59 mL/min/1.73   BUN/Creatinine Ratio 31 (H) 12 - 28   Sodium 142 134 - 144 mmol/L   Potassium 4.6 3.5 - 5.2 mmol/L   Chloride 106 96 - 106 mmol/L   CO2 22 20 - 29 mmol/L   Calcium 9.6 8.7 - 10.3 mg/dL   Total Protein 6.6 6.0 - 8.5 g/dL   Albumin 4.5 3.6 - 4.6 g/dL   Globulin, Total 2.1 1.5 - 4.5 g/dL   Albumin/Globulin Ratio 2.1 1.2 - 2.2   Bilirubin Total 0.3 0.0 - 1.2 mg/dL   Alkaline Phosphatase 40 (L) 44 - 121 IU/L   AST 18 0 - 40 IU/L   ALT 15 0 - 32 IU/L  Lipid Panel w/o Chol/HDL Ratio  Result Value Ref Range   Cholesterol, Total 151 100 - 199 mg/dL    Triglycerides 60 0 - 149 mg/dL   HDL 62 >39  mg/dL   VLDL Cholesterol Cal 12 5 - 40 mg/dL   LDL Chol Calc (NIH) 77 0 - 99 mg/dL  Microalbumin, Urine Waived  Result Value Ref Range   Microalb, Ur Waived 30 (H) 0 - 19 mg/L   Creatinine, Urine Waived 300 10 - 300 mg/dL   Microalb/Creat Ratio <30 <30 mg/g  TSH  Result Value Ref Range   TSH 0.880 0.450 - 4.500 uIU/mL  Urinalysis, Routine w reflex microscopic  Result Value Ref Range   Specific Gravity, UA 1.025 1.005 - 1.030   pH, UA 5.0 5.0 - 7.5   Color, UA Yellow Yellow   Appearance Ur Clear Clear   Leukocytes,UA Negative Negative   Protein,UA Negative Negative/Trace   Glucose, UA Negative Negative   Ketones, UA Trace (A) Negative   RBC, UA Negative Negative   Bilirubin, UA Negative Negative   Urobilinogen, Ur 0.2 0.2 - 1.0 mg/dL   Nitrite, UA Negative Negative      Assessment & Plan:   Problem List Items Addressed This Visit   None   Visit Diagnoses    Dizziness    -  Primary   Normal EKG. Normal orthostatics. Mild facial lag on R. Worsened gait. Will check labs. Check CT head to r/o stroke. Await results.    Relevant Orders   EKG 12-Lead (Completed)   Comprehensive metabolic panel   CBC with Differential/Platelet   TSH   Magnesium   CT Head Wo Contrast   Fatigue, unspecified type       Checking labs today. Await results. Treat as needed.    Relevant Orders   TSH   Gait abnormality       Chronic- but worse today. Will check labs and CT head to r/o stroke.    Relevant Orders   CT Head Wo Contrast       Follow up plan: Return in about 2 weeks (around 11/22/2020) for Recheck on dizziness.  >30 minutes spent with patient today.

## 2020-11-08 NOTE — Patient Instructions (Signed)
Dizziness Dizziness is a common problem. It is a feeling of unsteadiness or light-headedness. You may feel like you are about to faint. Dizziness can lead to injury if you stumble or fall. Anyone can become dizzy, but dizziness is more common in older adults. This condition can be caused by a number of things, including medicines, dehydration, or illness. Follow these instructions at home: Eating and drinking  Drink enough fluid to keep your urine clear or pale yellow. This helps to keep you from becoming dehydrated. Try to drink more clear fluids, such as water.  Do not drink alcohol.  Limit your caffeine intake if told to do so by your health care provider. Check ingredients and nutrition facts to see if a food or beverage contains caffeine.  Limit your salt (sodium) intake if told to do so by your health care provider. Check ingredients and nutrition facts to see if a food or beverage contains sodium. Activity  Avoid making quick movements. ? Rise slowly from chairs and steady yourself until you feel okay. ? In the morning, first sit up on the side of the bed. When you feel okay, stand slowly while you hold onto something until you know that your balance is fine.  If you need to stand in one place for a long time, move your legs often. Tighten and relax the muscles in your legs while you are standing.  Do not drive or use heavy machinery if you feel dizzy.  Avoid bending down if you feel dizzy. Place items in your home so that they are easy for you to reach without leaning over. Lifestyle  Do not use any products that contain nicotine or tobacco, such as cigarettes and e-cigarettes. If you need help quitting, ask your health care provider.  Try to reduce your stress level by using methods such as yoga or meditation. Talk with your health care provider if you need help to manage your stress. General instructions  Watch your dizziness for any changes.  Take over-the-counter and  prescription medicines only as told by your health care provider. Talk with your health care provider if you think that your dizziness is caused by a medicine that you are taking.  Tell a friend or a family member that you are feeling dizzy. If he or she notices any changes in your behavior, have this person call your health care provider.  Keep all follow-up visits as told by your health care provider. This is important. Contact a health care provider if:  Your dizziness does not go away.  Your dizziness or light-headedness gets worse.  You feel nauseous.  You have reduced hearing.  You have new symptoms.  You are unsteady on your feet or you feel like the room is spinning. Get help right away if:  You vomit or have diarrhea and are unable to eat or drink anything.  You have problems talking, walking, swallowing, or using your arms, hands, or legs.  You feel generally weak.  You are not thinking clearly or you have trouble forming sentences. It may take a friend or family member to notice this.  You have chest pain, abdominal pain, shortness of breath, or sweating.  Your vision changes.  You have any bleeding.  You have a severe headache.  You have neck pain or a stiff neck.  You have a fever. These symptoms may represent a serious problem that is an emergency. Do not wait to see if the symptoms will go away. Get medical help   right away. Call your local emergency services (911 in the [place] Do not drive yourself to the hospital. Summary  Dizziness is a feeling of unsteadiness or light-headedness. This condition can be caused by a number of things, including medicines, dehydration, or illness.  Anyone can become dizzy, but dizziness is more common in older adults.  Drink enough fluid to keep your urine clear or pale yellow. Do not drink alcohol.  Avoid making quick movements if you feel dizzy. Monitor your dizziness for any changes. This information is not intended to  replace advice given to you by your health care provider. Make sure you discuss any questions you have with your health care provider. Document Revised: 05/29/2017 Document Reviewed: 06/28/2016 Elsevier Patient Education  2021 [employer]  

## 2020-11-08 NOTE — Progress Notes (Signed)
Called and spoke to daughter about results. Will start meclizine and await labs.

## 2020-11-08 NOTE — Telephone Encounter (Signed)
Pt scheduled for later apt at 11:20 as she states she does not feel safe driving and is trying to see if daughter will be able to bring her.

## 2020-11-09 LAB — COMPREHENSIVE METABOLIC PANEL
ALT: 18 IU/L (ref 0–32)
AST: 19 IU/L (ref 0–40)
Albumin/Globulin Ratio: 2.1 (ref 1.2–2.2)
Albumin: 4.4 g/dL (ref 3.6–4.6)
Alkaline Phosphatase: 44 IU/L (ref 44–121)
BUN/Creatinine Ratio: 27 (ref 12–28)
BUN: 31 mg/dL — ABNORMAL HIGH (ref 8–27)
Bilirubin Total: 0.4 mg/dL (ref 0.0–1.2)
CO2: 24 mmol/L (ref 20–29)
Calcium: 9.6 mg/dL (ref 8.7–10.3)
Chloride: 102 mmol/L (ref 96–106)
Creatinine, Ser: 1.15 mg/dL — ABNORMAL HIGH (ref 0.57–1.00)
Globulin, Total: 2.1 g/dL (ref 1.5–4.5)
Glucose: 98 mg/dL (ref 65–99)
Potassium: 4.4 mmol/L (ref 3.5–5.2)
Sodium: 140 mmol/L (ref 134–144)
Total Protein: 6.5 g/dL (ref 6.0–8.5)
eGFR: 46 mL/min/{1.73_m2} — ABNORMAL LOW (ref >59)

## 2020-11-09 LAB — CBC WITH DIFFERENTIAL/PLATELET
Basophils Absolute: 0.1 10*3/uL (ref 0.0–0.2)
Basos: 1 %
EOS (ABSOLUTE): 0.1 10*3/uL (ref 0.0–0.4)
Eos: 2 %
Hematocrit: 35.8 % (ref 34.0–46.6)
Hemoglobin: 11.6 g/dL (ref 11.1–15.9)
Immature Grans (Abs): 0 10*3/uL (ref 0.0–0.1)
Immature Granulocytes: 0 %
Lymphocytes Absolute: 1.3 10*3/uL (ref 0.7–3.1)
Lymphs: 25 %
MCH: 29.2 pg (ref 26.6–33.0)
MCHC: 32.4 g/dL (ref 31.5–35.7)
MCV: 90 fL (ref 79–97)
Monocytes Absolute: 0.3 10*3/uL (ref 0.1–0.9)
Monocytes: 7 %
Neutrophils Absolute: 3.3 10*3/uL (ref 1.4–7.0)
Neutrophils: 65 %
Platelets: 207 10*3/uL (ref 150–450)
RBC: 3.97 x10E6/uL (ref 3.77–5.28)
RDW: 13.8 % (ref 11.7–15.4)
WBC: 5 10*3/uL (ref 3.4–10.8)

## 2020-11-09 LAB — TSH: TSH: 0.838 u[IU]/mL (ref 0.450–4.500)

## 2020-11-09 LAB — MAGNESIUM: Magnesium: 2.1 mg/dL (ref 1.6–2.3)

## 2020-11-23 ENCOUNTER — Other Ambulatory Visit: Payer: Self-pay

## 2020-11-23 ENCOUNTER — Ambulatory Visit (INDEPENDENT_AMBULATORY_CARE_PROVIDER_SITE_OTHER): Payer: Medicare Other | Admitting: Family Medicine

## 2020-11-23 ENCOUNTER — Encounter: Payer: Self-pay | Admitting: Family Medicine

## 2020-11-23 VITALS — BP 98/63 | HR 92 | Temp 98.2°F | Wt 145.0 lb

## 2020-11-23 DIAGNOSIS — R42 Dizziness and giddiness: Secondary | ICD-10-CM | POA: Diagnosis not present

## 2020-11-23 DIAGNOSIS — I959 Hypotension, unspecified: Secondary | ICD-10-CM | POA: Diagnosis not present

## 2020-11-23 DIAGNOSIS — N289 Disorder of kidney and ureter, unspecified: Secondary | ICD-10-CM

## 2020-11-23 MED ORDER — ESTRADIOL 0.5 MG PO TABS: 0.2500 mg | ORAL_TABLET | Freq: Every day | ORAL | 1 refills | Status: DC

## 2020-11-23 MED ORDER — LISINOPRIL 5 MG PO TABS: 5.0000 mg | ORAL_TABLET | Freq: Every day | ORAL | 2 refills | Status: DC

## 2020-11-23 NOTE — Patient Instructions (Addendum)
STOP Lisinopril-Hydrochlorothiazide Start plain lisinopril 5mg 

## 2020-11-23 NOTE — Progress Notes (Signed)
BP 98/63   Pulse 92   Temp 98.2 F (36.8 C)   Wt 145 lb (65.8 kg)   LMP 11/28/1971 (Approximate)   SpO2 96%   BMI 27.40 kg/m    Subjective:    Patient ID: Karen Velasquez, female    DOB: [birth year], 87 y.o.   MRN: [id number]  HPI: Karen Velasquez is a 87 y.o. female  Chief Complaint  Patient presents with   Dizziness   Dizziness has resolved. She is feeling back to herself. Hot flashes are bothering her a little more. She notes that she is feeling more like herself. Went to play bridge today. No other concerns or complaints at this time.    Relevant past medical, surgical, family and social history reviewed and updated as indicated. Interim medical history since our last visit reviewed. Allergies and medications reviewed and updated.  Review of Systems  Constitutional: Negative.   Respiratory: Negative.    Cardiovascular: Negative.   Gastrointestinal: Negative.   Neurological: Negative.   Psychiatric/Behavioral: Negative.     Per HPI unless specifically indicated above     Objective:    BP 98/63   Pulse 92   Temp 98.2 F (36.8 C)   Wt 145 lb (65.8 kg)   LMP 11/28/1971 (Approximate)   SpO2 96%   BMI 27.40 kg/m   Wt Readings from Last 3 Encounters:  11/23/20 145 lb (65.8 kg)  11/08/20 146 lb (66.2 kg)  07/31/20 146 lb 12.8 oz (66.6 kg)    Physical Exam Vitals and nursing note reviewed.  Constitutional:      General: She is not in acute distress.    Appearance: Normal appearance. She is not ill-appearing, toxic-appearing or diaphoretic.  HENT:     Head: Normocephalic and atraumatic.     Right Ear: External ear normal.     Left Ear: External ear normal.     Nose: Nose normal.     Mouth/Throat:     Mouth: Mucous membranes are moist.     Pharynx: Oropharynx is clear.  Eyes:     General: No scleral icterus.       Right eye: No discharge.        Left eye: No discharge.     Extraocular Movements: Extraocular movements intact.     Conjunctiva/sclera:  Conjunctivae normal.     Pupils: Pupils are equal, round, and reactive to light.  Cardiovascular:     Rate and Rhythm: Normal rate and regular rhythm.     Pulses: Normal pulses.     Heart sounds: Normal heart sounds. No murmur heard.   No friction rub. No gallop.  Pulmonary:     Effort: Pulmonary effort is normal. No respiratory distress.     Breath sounds: Normal breath sounds. No stridor. No wheezing, rhonchi or rales.  Chest:     Chest wall: No tenderness.  Musculoskeletal:        General: Normal range of motion.     Cervical back: Normal range of motion and neck supple.  Skin:    General: Skin is warm and dry.     Capillary Refill: Capillary refill takes less than 2 seconds.     Coloration: Skin is not jaundiced or pale.     Findings: No bruising, erythema, lesion or rash.  Neurological:     General: No focal deficit present.     Mental Status: She is alert and oriented to person, place, and time. Mental status is at baseline.  Psychiatric:  Mood and Affect: Mood normal.        Behavior: Behavior normal.        Thought Content: Thought content normal.        Judgment: Judgment normal.    Results for orders placed or performed in visit on 11/08/20  Comprehensive metabolic panel  Result Value Ref Range   Glucose 98 65 - 99 mg/dL   BUN 31 (H) 8 - 27 mg/dL   Creatinine, Ser 1.15 (H) 0.57 - 1.00 mg/dL   eGFR 46 (L) >59 mL/min/1.73   BUN/Creatinine Ratio 27 12 - 28   Sodium 140 134 - 144 mmol/L   Potassium 4.4 3.5 - 5.2 mmol/L   Chloride 102 96 - 106 mmol/L   CO2 24 20 - 29 mmol/L   Calcium 9.6 8.7 - 10.3 mg/dL   Total Protein 6.5 6.0 - 8.5 g/dL   Albumin 4.4 3.6 - 4.6 g/dL   Globulin, Total 2.1 1.5 - 4.5 g/dL   Albumin/Globulin Ratio 2.1 1.2 - 2.2   Bilirubin Total 0.4 0.0 - 1.2 mg/dL   Alkaline Phosphatase 44 44 - 121 IU/L   AST 19 0 - 40 IU/L   ALT 18 0 - 32 IU/L  CBC with Differential/Platelet  Result Value Ref Range   WBC 5.0 3.4 - 10.8 x10E3/uL   RBC  3.97 3.77 - 5.28 x10E6/uL   Hemoglobin 11.6 11.1 - 15.9 g/dL   Hematocrit 35.8 34.0 - 46.6 %   MCV 90 79 - 97 fL   MCH 29.2 26.6 - 33.0 pg   MCHC 32.4 31.5 - 35.7 g/dL   RDW 13.8 11.7 - 15.4 %   Platelets 207 150 - 450 x10E3/uL   Neutrophils 65 Not Estab. %   Lymphs 25 Not Estab. %   Monocytes 7 Not Estab. %   Eos 2 Not Estab. %   Basos 1 Not Estab. %   Neutrophils Absolute 3.3 1.4 - 7.0 x10E3/uL   Lymphocytes Absolute 1.3 0.7 - 3.1 x10E3/uL   Monocytes Absolute 0.3 0.1 - 0.9 x10E3/uL   EOS (ABSOLUTE) 0.1 0.0 - 0.4 x10E3/uL   Basophils Absolute 0.1 0.0 - 0.2 x10E3/uL   Immature Granulocytes 0 Not Estab. %   Immature Grans (Abs) 0.0 0.0 - 0.1 x10E3/uL  TSH  Result Value Ref Range   TSH 0.838 0.450 - 4.500 uIU/mL  Magnesium  Result Value Ref Range   Magnesium 2.1 1.6 - 2.3 mg/dL      Assessment & Plan:   Problem List Items Addressed This Visit   None Visit Diagnoses     Abnormal kidney function    -  Primary   Rechecking labs today. Await results. Treat as needed.    Relevant Orders   Basic metabolic panel   Hypotension, unspecified hypotension type       Will stop her HCTZ and decrease her lisinopril to $RemoveBefor'5mg'xhWoGozvrmsP$ . Recheck 1 month. Call with any concerns.   Relevant Medications   lisinopril (ZESTRIL) 5 MG tablet   Dizziness       Resolved.         Follow up plan: Return in about 4 weeks (around 12/21/2020).

## 2020-11-24 LAB — BASIC METABOLIC PANEL
BUN/Creatinine Ratio: 33 — ABNORMAL HIGH (ref 12–28)
BUN: 37 mg/dL — ABNORMAL HIGH (ref 8–27)
CO2: 22 mmol/L (ref 20–29)
Calcium: 9.8 mg/dL (ref 8.7–10.3)
Chloride: 103 mmol/L (ref 96–106)
Creatinine, Ser: 1.12 mg/dL — ABNORMAL HIGH (ref 0.57–1.00)
Glucose: 116 mg/dL — ABNORMAL HIGH (ref 65–99)
Potassium: 4.4 mmol/L (ref 3.5–5.2)
Sodium: 139 mmol/L (ref 134–144)
eGFR: 47 mL/min/{1.73_m2} — ABNORMAL LOW (ref >59)

## 2020-11-27 ENCOUNTER — Encounter: Payer: Self-pay | Admitting: Family Medicine

## 2020-11-29 ENCOUNTER — Other Ambulatory Visit: Payer: Self-pay | Admitting: Family Medicine

## 2020-11-29 NOTE — Telephone Encounter (Signed)
Pt has apt on 12/25/2020 

## 2020-11-29 NOTE — Telephone Encounter (Signed)
Requested medication (s) are due for refill today:   reordered on 11/23/2020 by Dr. Wynetta Emery  Requested medication (s) are on the active medication list:   Yes  Future visit scheduled:   Yes   Last ordered: 11/23/2020 345, 1 refill  Returned because this was renewed on 6/17 however mammogram is not up to date per protocol.   Pharmacy has also sent it back through as a duplicate.     Requested Prescriptions  Pending Prescriptions Disp Refills   estradiol (ESTRACE) 0.5 MG tablet [Pharmacy Med Name: ESTRADIOL TABS 0.5MG ] 45 tablet 3    Sig: TAKE ONE-HALF (1/2) TABLET DAILY      OB/GYN:  Estrogens Failed - 11/29/2020  6:23 AM      Failed - Mammogram is up-to-date per Health Maintenance      Passed - Last BP in normal range    BP Readings from Last 1 Encounters:  11/23/20 98/63          Passed - Valid encounter within last 12 months    Recent Outpatient Visits           6 days ago Abnormal kidney function   [facility] [place], [place], DO   3 weeks ago Dizziness   [place], [place], DO   4 months ago Wellness examination   [employer], [place], DO   10 months ago Mixed hyperlipidemia   [employer], Megan P, DO   1 year ago Simple chronic bronchitis ([place])   [place], Barbaraann Faster, NP       Future Appointments             In 3 weeks Wynetta Emery, Barb Merino, DO [employer], [place]   In 2 months Wynetta Emery, Barb Merino, DO [employer], [facility]

## 2020-12-03 DIAGNOSIS — H2512 Age-related nuclear cataract, left eye: Secondary | ICD-10-CM | POA: Diagnosis not present

## 2020-12-03 DIAGNOSIS — H01009 Unspecified blepharitis unspecified eye, unspecified eyelid: Secondary | ICD-10-CM | POA: Diagnosis not present

## 2020-12-03 DIAGNOSIS — H01004 Unspecified blepharitis left upper eyelid: Secondary | ICD-10-CM | POA: Diagnosis not present

## 2020-12-19 ENCOUNTER — Encounter: Payer: Self-pay | Admitting: Family Medicine

## 2020-12-25 ENCOUNTER — Other Ambulatory Visit: Payer: Self-pay

## 2020-12-25 ENCOUNTER — Ambulatory Visit (INDEPENDENT_AMBULATORY_CARE_PROVIDER_SITE_OTHER): Payer: Medicare Other | Admitting: Family Medicine

## 2020-12-25 ENCOUNTER — Encounter: Payer: Self-pay | Admitting: Family Medicine

## 2020-12-25 VITALS — BP 127/74 | HR 76 | Temp 98.4°F | Ht 60.7 in | Wt 145.0 lb

## 2020-12-25 DIAGNOSIS — I129 Hypertensive chronic kidney disease with stage 1 through stage 4 chronic kidney disease, or unspecified chronic kidney disease: Secondary | ICD-10-CM

## 2020-12-25 MED ORDER — LISINOPRIL 5 MG PO TABS: 5.0000 mg | ORAL_TABLET | Freq: Every day | ORAL | 1 refills | Status: DC

## 2020-12-25 NOTE — Progress Notes (Signed)
 BP 127/74   Pulse 76   Temp 98.4 F (36.9 C) (Oral)   Ht 5' 0.7" (1.542 m)   Wt 145 lb (65.8 kg)   LMP 11/28/1971 (Approximate)   SpO2 98%   BMI 27.67 kg/m    Subjective:    Patient ID: Karen Velasquez, female    DOB: 01/11/1931, 87 y.o.   MRN: [id number]  HPI: Karen Velasquez is a 87 y.o. female  Chief Complaint  Patient presents with   Hypertension   HYPERTENSION Hypertension status: better  Satisfied with current treatment? yes Duration of hypertension: chronic BP monitoring frequency:  not checking BP medication side effects:  no Medication compliance: excellent compliance Previous BP meds:lisinopril Aspirin: no Recurrent headaches: no Visual changes: no Palpitations: no Dyspnea: no Chest pain: no Lower extremity edema: no Dizzy/lightheaded: no  Relevant past medical, surgical, family and social history reviewed and updated as indicated. Interim medical history since our last visit reviewed. Allergies and medications reviewed and updated.  Review of Systems  Constitutional: Negative.   Respiratory: Negative.    Cardiovascular: Negative.   Gastrointestinal: Negative.   Psychiatric/Behavioral: Negative.     Per HPI unless specifically indicated above     Objective:    BP 127/74   Pulse 76   Temp 98.4 F (36.9 C) (Oral)   Ht 5' 0.7" (1.542 m)   Wt 145 lb (65.8 kg)   LMP 11/28/1971 (Approximate)   SpO2 98%   BMI 27.67 kg/m   Wt Readings from Last 3 Encounters:  12/25/20 145 lb (65.8 kg)  11/23/20 145 lb (65.8 kg)  11/08/20 146 lb (66.2 kg)    Physical Exam Vitals and nursing note reviewed.  Constitutional:      General: She is not in acute distress.    Appearance: Normal appearance. She is not ill-appearing, toxic-appearing or diaphoretic.  HENT:     Head: Normocephalic and atraumatic.     Right Ear: External ear normal.     Left Ear: External ear normal.     Nose: Nose normal.     Mouth/Throat:     Mouth: Mucous membranes are moist.      Pharynx: Oropharynx is clear.  Eyes:     General: No scleral icterus.       Right eye: No discharge.        Left eye: No discharge.     Extraocular Movements: Extraocular movements intact.     Conjunctiva/sclera: Conjunctivae normal.     Pupils: Pupils are equal, round, and reactive to light.  Cardiovascular:     Rate and Rhythm: Normal rate and regular rhythm.     Pulses: Normal pulses.     Heart sounds: Normal heart sounds. No murmur heard.   No friction rub. No gallop.  Pulmonary:     Effort: Pulmonary effort is normal. No respiratory distress.     Breath sounds: Normal breath sounds. No stridor. No wheezing, rhonchi or rales.  Chest:     Chest wall: No tenderness.  Musculoskeletal:        General: Normal range of motion.     Cervical back: Normal range of motion and neck supple.  Skin:    General: Skin is warm and dry.     Capillary Refill: Capillary refill takes less than 2 seconds.     Coloration: Skin is not jaundiced or pale.     Findings: No bruising, erythema, lesion or rash.  Neurological:     General: No focal deficit present.       Mental Status: She is alert and oriented to person, place, and time. Mental status is at baseline.  Psychiatric:        Mood and Affect: Mood normal.        Behavior: Behavior normal.        Thought Content: Thought content normal.        Judgment: Judgment normal.    Results for orders placed or performed in visit on 04/04/24  Basic metabolic panel  Result Value Ref Range   Glucose 116 (H) 65 - 99 mg/dL   BUN 37 (H) 8 - 27 mg/dL   Creatinine, Ser 1.12 (H) 0.57 - 1.00 mg/dL   eGFR 47 (L) >59 mL/min/1.73   BUN/Creatinine Ratio 33 (H) 12 - 28   Sodium 139 134 - 144 mmol/L   Potassium 4.4 3.5 - 5.2 mmol/L   Chloride 103 96 - 106 mmol/L   CO2 22 20 - 29 mmol/L   Calcium 9.8 8.7 - 10.3 mg/dL      Assessment & Plan:   Problem List Items Addressed This Visit       Genitourinary   Benign hypertensive renal disease - Primary     Under good control on current regimen. Continue current regimen. Continue to monitor. Call with any concerns. Refills given. Labs drawn today.        Relevant Orders   Basic metabolic panel     Follow up plan: Return in about 3 months (around 03/27/2021).

## 2020-12-25 NOTE — Assessment & Plan Note (Signed)
Under good control on current regimen. Continue current regimen. Continue to monitor. Call with any concerns. Refills given. Labs drawn today.   

## 2020-12-26 ENCOUNTER — Telehealth: Payer: Self-pay

## 2020-12-26 LAB — BASIC METABOLIC PANEL
BUN/Creatinine Ratio: 21 (ref 12–28)
BUN: 20 mg/dL (ref 8–27)
CO2: 24 mmol/L (ref 20–29)
Calcium: 9.9 mg/dL (ref 8.7–10.3)
Chloride: 104 mmol/L (ref 96–106)
Creatinine, Ser: 0.95 mg/dL (ref 0.57–1.00)
Glucose: 87 mg/dL (ref 65–99)
Potassium: 4.3 mmol/L (ref 3.5–5.2)
Sodium: 143 mmol/L (ref 134–144)
eGFR: 58 mL/min/{1.73_m2} — ABNORMAL LOW (ref >59)

## 2020-12-26 NOTE — Telephone Encounter (Signed)
Called [facility] for Estradiol rx, they do not have it. Requesting new RX to be sent in electronically.

## 2020-12-27 MED ORDER — ESTRADIOL 0.5 MG PO TABS: 0.2500 mg | ORAL_TABLET | Freq: Every day | ORAL | 1 refills | Status: DC

## 2020-12-29 ENCOUNTER — Other Ambulatory Visit: Payer: Self-pay | Admitting: Family Medicine

## 2020-12-29 NOTE — Telephone Encounter (Signed)
Requested Prescriptions  Pending Prescriptions Disp Refills  . ezetimibe (ZETIA) 10 MG tablet [Pharmacy Med Name: EZETIMIBE TABS '10MG'$ ] 90 tablet 3    Sig: TAKE 1 TABLET DAILY     Cardiovascular:  Antilipid - Sterol Transport Inhibitors Passed - 12/29/2020  8:33 AM      Passed - Total Cholesterol in normal range and within 360 days    Cholesterol, Total  Date Value Ref Range Status  07/31/2020 151 100 - 199 mg/dL Final   Cholesterol Piccolo, Waived  Date Value Ref Range Status  12/08/2014 153 <200 mg/dL Final    Comment:                            Desirable                <200                         Borderline High      200- 239                         High                     >239          Passed - LDL in normal range and within 360 days    LDL Chol Calc (NIH)  Date Value Ref Range Status  07/31/2020 77 0 - 99 mg/dL Final         Passed - HDL in normal range and within 360 days    HDL  Date Value Ref Range Status  07/31/2020 62 >39 mg/dL Final         Passed - Triglycerides in normal range and within 360 days    Triglycerides  Date Value Ref Range Status  07/31/2020 60 0 - 149 mg/dL Final   Triglycerides Piccolo,Waived  Date Value Ref Range Status  12/08/2014 74 <150 mg/dL Final    Comment:                            Normal                   <150                         Borderline High     150 - 199                         High                200 - 499                         Very High                >499          Passed - Valid encounter within last 12 months    Recent Outpatient Visits          4 days ago Benign hypertensive renal disease   [facility] [place], Megan P, DO   1 month ago Abnormal kidney function   [place], [place], DO   1 month ago [place], Megan  P, DO   5 months ago Wellness examination   [place], [place], DO   11 months ago Mixed  hyperlipidemia   [facility] [place], [place], DO      Future Appointments            In 2 months Wynetta Emery, Barb Merino, DO [employer] [facility], [facility]

## 2021-01-10 NOTE — Telephone Encounter (Signed)
[facility] this morning and they told her that her PCP has not approved this Rx. Please advise, pt still does not have her Rx.

## 2021-01-11 NOTE — Telephone Encounter (Signed)
I sent the Rx again on 7/21- something seems to be up with them getting it- can we call it in please?

## 2021-01-14 NOTE — Telephone Encounter (Signed)
Spoke with [facility] pharmacy to check the status of patient prescription for Estradiol 0.5 mg. Pharmacist informed me they received the patient prescription on 12/27/20. Pharmacist states they would process the prescription and get it sent to the patient at home. Attempted to contact patient to inform her about the status of her medication. Left message for patient to give our office a call back to discuss.

## 2021-01-29 ENCOUNTER — Ambulatory Visit: Payer: Medicare Other | Admitting: Family Medicine

## 2021-02-06 ENCOUNTER — Other Ambulatory Visit: Payer: Self-pay | Admitting: Family Medicine

## 2021-02-06 DIAGNOSIS — Z20822 Contact with and (suspected) exposure to covid-19: Secondary | ICD-10-CM | POA: Diagnosis not present

## 2021-02-06 NOTE — Telephone Encounter (Signed)
Requested Prescriptions  Pending Prescriptions Disp Refills  . fenofibrate micronized (ANTARA) 130 MG capsule [Pharmacy Med Name: FENOFIBRATE CAPS $RemoveBeforeDE'130MG'nuEFiAhjRrhBONu$ ] 90 capsule 3    Sig: TAKE 1 CAPSULE DAILY BEFORE BREAKFAST     Cardiovascular:  Antilipid - Fibric Acid Derivatives Failed - 02/06/2021  4:52 AM      Failed - eGFR in normal range and within 180 days    GFR calc Af Amer  Date Value Ref Range Status  07/31/2020 55 (L) >59 mL/min/1.73 Final    Comment:    **In accordance with recommendations from the NKF-ASN Task force,**   [facility] is in the process of updating its eGFR calculation to the   2021 CKD-EPI creatinine equation that estimates kidney function   without a race variable.    GFR calc non Af Amer  Date Value Ref Range Status  07/31/2020 47 (L) >59 mL/min/1.73 Final   eGFR  Date Value Ref Range Status  12/25/2020 58 (L) >59 mL/min/1.73 Final         Passed - Total Cholesterol in normal range and within 360 days    Cholesterol, Total  Date Value Ref Range Status  07/31/2020 151 100 - 199 mg/dL Final   Cholesterol Piccolo, Waived  Date Value Ref Range Status  12/08/2014 153 <200 mg/dL Final    Comment:                            Desirable                <200                         Borderline High      200- 239                         High                     >239          Passed - LDL in normal range and within 360 days    LDL Chol Calc (NIH)  Date Value Ref Range Status  07/31/2020 77 0 - 99 mg/dL Final         Passed - HDL in normal range and within 360 days    HDL  Date Value Ref Range Status  07/31/2020 62 >39 mg/dL Final         Passed - Triglycerides in normal range and within 360 days    Triglycerides  Date Value Ref Range Status  07/31/2020 60 0 - 149 mg/dL Final   Triglycerides Piccolo,Waived  Date Value Ref Range Status  12/08/2014 74 <150 mg/dL Final    Comment:                            Normal                   <150                          Borderline High     150 - 199                         High  200 - 499                         Very High                >499          Passed - ALT in normal range and within 180 days    ALT  Date Value Ref Range Status  11/08/2020 18 0 - 32 IU/L Final         Passed - AST in normal range and within 180 days    AST  Date Value Ref Range Status  11/08/2020 19 0 - 40 IU/L Final         Passed - Cr in normal range and within 180 days    Creatinine, Ser  Date Value Ref Range Status  12/25/2020 0.95 0.57 - 1.00 mg/dL Final         Passed - Valid encounter within last 12 months    Recent Outpatient Visits          1 month ago Benign hypertensive renal disease   [facility] [place], Megan P, DO   2 months ago Abnormal kidney function   [facility] [place], [place], DO   3 months ago Dizziness   [place], [place], DO   6 months ago Wellness examination   [employer], [place], DO   1 year ago Mixed hyperlipidemia   [facility] [place], [place], DO      Future Appointments            In 1 month [place], Barb Merino, DO [employer] [facility]           . ADVAIR DISKUS 250-50 MCG/ACT AEPB [Pharmacy Med Name: ADVAIR DISKUS 60'S 250/50MCG] 180 each 1    Sig: USE 1 INHALATION TWICE A DAY     Pulmonology:  Comb[employer] Products Passed - 02/06/2021  4:52 AM      Passed - Valid encounter within last 12 months    Recent Outpatient Visits          1 month ago Benign hypertensive renal disease   [facility] [place], Megan P, DO   2 months ago Abnormal kidney function   [facility] [place], [place], DO   3 months ago Dizziness   [place], [place], DO   6 months ago Wellness examination   [employer], [place], DO   1 year ago Mixed hyperlipidemia   [facility] [place], [place], DO      Future  Appointments            In 1 month [place], Barb Merino, DO [employer] [facility]

## 2021-02-06 NOTE — Telephone Encounter (Signed)
Requested medications are due for refill today.  Unknown  Requested medications are on the active medications list.  no  Last refill. 08/21/2020  Future visit scheduled.   yes  Notes to clinic.  Rx was written by Levert Feinstein.

## 2021-03-09 DIAGNOSIS — Z20822 Contact with and (suspected) exposure to covid-19: Secondary | ICD-10-CM | POA: Diagnosis not present

## 2021-03-11 DIAGNOSIS — Z23 Encounter for immunization: Secondary | ICD-10-CM | POA: Diagnosis not present

## 2021-03-27 ENCOUNTER — Encounter: Payer: Self-pay | Admitting: Family Medicine

## 2021-03-27 ENCOUNTER — Ambulatory Visit (INDEPENDENT_AMBULATORY_CARE_PROVIDER_SITE_OTHER): Payer: Medicare Other | Admitting: Family Medicine

## 2021-03-27 ENCOUNTER — Other Ambulatory Visit: Payer: Self-pay

## 2021-03-27 VITALS — BP 130/74 | HR 87 | Ht 60.0 in | Wt 144.0 lb

## 2021-03-27 DIAGNOSIS — I129 Hypertensive chronic kidney disease with stage 1 through stage 4 chronic kidney disease, or unspecified chronic kidney disease: Secondary | ICD-10-CM

## 2021-03-27 NOTE — Progress Notes (Signed)
BP 130/74   Pulse 87   Ht 5' (1.524 m)   Wt 144 lb (65.3 kg)   LMP 11/28/1971 (Approximate)   BMI 28.12 kg/m    Subjective:    Patient ID: Karen Velasquez, female    DOB: Nov 01, 1930, 87 y.o.   MRN: [id number]  HPI: Karen Velasquez is a 87 y.o. female  Chief Complaint  Patient presents with   Chronic Kidney Disease   Going to stop driving. Feels like her memory is not as good. Her daughter and her grandsons are able to drive her.   HYPERTENSION Hypertension status: controlled  Satisfied with current treatment? yes Duration of hypertension: chronic BP monitoring frequency:  not checking BP medication side effects:  no Medication compliance: excellent compliance Previous BP meds: lisinopril Aspirin: no Recurrent headaches: no Visual changes: no Palpitations: no Dyspnea: no Chest pain: no Lower extremity edema: no Dizzy/lightheaded: no   Relevant past medical, surgical, family and social history reviewed and updated as indicated. Interim medical history since our last visit reviewed. Allergies and medications reviewed and updated.  Review of Systems  Constitutional: Negative.   Respiratory: Negative.    Cardiovascular: Negative.   Gastrointestinal: Negative.   Musculoskeletal: Negative.   Psychiatric/Behavioral: Negative.     Per HPI unless specifically indicated above     Objective:    BP 130/74   Pulse 87   Ht 5' (1.524 m)   Wt 144 lb (65.3 kg)   LMP 11/28/1971 (Approximate)   BMI 28.12 kg/m   Wt Readings from Last 3 Encounters:  03/27/21 144 lb (65.3 kg)  12/25/20 145 lb (65.8 kg)  11/23/20 145 lb (65.8 kg)    Physical Exam Vitals and nursing note reviewed.  Constitutional:      General: She is not in acute distress.    Appearance: Normal appearance. She is not ill-appearing, toxic-appearing or diaphoretic.  HENT:     Head: Normocephalic and atraumatic.     Right Ear: External ear normal.     Left Ear: External ear normal.     Nose: Nose  normal.     Mouth/Throat:     Mouth: Mucous membranes are moist.     Pharynx: Oropharynx is clear.  Eyes:     General: No scleral icterus.       Right eye: No discharge.        Left eye: No discharge.     Extraocular Movements: Extraocular movements intact.     Conjunctiva/sclera: Conjunctivae normal.     Pupils: Pupils are equal, round, and reactive to light.  Cardiovascular:     Rate and Rhythm: Normal rate and regular rhythm.     Pulses: Normal pulses.     Heart sounds: Normal heart sounds. No murmur heard.   No friction rub. No gallop.  Pulmonary:     Effort: Pulmonary effort is normal. No respiratory distress.     Breath sounds: Normal breath sounds. No stridor. No wheezing, rhonchi or rales.  Chest:     Chest wall: No tenderness.  Musculoskeletal:        General: Normal range of motion.     Cervical back: Normal range of motion and neck supple.  Skin:    General: Skin is warm and dry.     Capillary Refill: Capillary refill takes less than 2 seconds.     Coloration: Skin is not jaundiced or pale.     Findings: No bruising, erythema, lesion or rash.  Neurological:  General: No focal deficit present.     Mental Status: She is alert and oriented to person, place, and time. Mental status is at baseline.  Psychiatric:        Mood and Affect: Mood normal.        Behavior: Behavior normal.        Thought Content: Thought content normal.        Judgment: Judgment normal.    Results for orders placed or performed in visit on 10/17/00  Basic metabolic panel  Result Value Ref Range   Glucose 87 65 - 99 mg/dL   BUN 20 8 - 27 mg/dL   Creatinine, Ser 0.95 0.57 - 1.00 mg/dL   eGFR 58 (L) >59 mL/min/1.73   BUN/Creatinine Ratio 21 12 - 28   Sodium 143 134 - 144 mmol/L   Potassium 4.3 3.5 - 5.2 mmol/L   Chloride 104 96 - 106 mmol/L   CO2 24 20 - 29 mmol/L   Calcium 9.9 8.7 - 10.3 mg/dL      Assessment & Plan:   Problem List Items Addressed This Visit        Genitourinary   Benign hypertensive renal disease - Primary    Under good control on current regimen. Continue current regimen. Continue to monitor. Call with any concerns. Refills up to date.          Follow up plan: Return after 07/31/21 for wellness/follow up with me.

## 2021-03-28 NOTE — Assessment & Plan Note (Signed)
Under good control on current regimen. Continue current regimen. Continue to monitor. Call with any concerns. Refills up to date.   

## 2021-07-05 ENCOUNTER — Other Ambulatory Visit: Payer: Self-pay | Admitting: Family Medicine

## 2021-07-05 NOTE — Telephone Encounter (Signed)
Requested medication (s) are due for refill today:   Yes for both  Requested medication (s) are on the active medication list:   Yes for both  Future visit scheduled:   Yes in 1 mo.   Last ordered: Estrace 12/27/2020 #45, 1 refill;   Lisinopril 12/25/2020 #90, 1 refill  Returned because protocol failed for up to date mammogram and lab work due.   Requested Prescriptions  Pending Prescriptions Disp Refills   estradiol (ESTRACE) 0.5 MG tablet [Pharmacy Med Name: ESTRADIOL TABS 0.5MG ] 45 tablet 3    Sig: TAKE ONE-HALF (1/2) TABLET DAILY     OB/GYN:  Estrogens Failed - 07/05/2021  8:32 AM      Failed - Mammogram is up-to-date per Health Maintenance      Passed - Last BP in normal range    BP Readings from Last 1 Encounters:  03/27/21 130/74          Passed - Valid encounter within last 12 months    Recent Outpatient Visits           3 months ago Benign hypertensive renal disease   [facility] [place], Megan P, DO   6 months ago Benign hypertensive renal disease   [facility] [place], Megan P, DO   7 months ago Abnormal kidney function   [employer], [place], DO   7 months ago Dizziness   [employer], Megan P, DO   11 months ago Wellness examination   [employer] [facility], [place], DO       Future Appointments             In 1 month Johnson, Megan P, DO [place]             lisinopril (ZESTRIL) 5 MG tablet [employer] Med Name: LISINOPRIL TABS 5MG ] 90 tablet 3    Sig: TAKE 1 TABLET DAILY     Cardiovascular:  ACE Inhibitors Failed - 07/05/2021  8:32 AM      Failed - Cr in normal range and within 180 days    Creatinine, Ser  Date Value Ref Range Status  12/25/2020 0.95 0.57 - 1.00 mg/dL Final          Failed - K in normal range and within 180 days    Potassium  Date Value Ref Range Status  12/25/2020 4.3 3.5 - 5.2 mmol/L Final          Passed - Patient is not  pregnant      Passed - Last BP in normal range    BP Readings from Last 1 Encounters:  03/27/21 130/74          Passed - Valid encounter within last 6 months    Recent Outpatient Visits           3 months ago Benign hypertensive renal disease   [facility] [place], Megan P, DO   6 months ago Benign hypertensive renal disease   [facility] [place], Megan P, DO   7 months ago Abnormal kidney function   [employer], [place], DO   7 months ago Dizziness   [employer], [place], DO   11 months ago Wellness examination   [employer], [place], DO       Future Appointments             In 1 month Johnson, [place], DO [facility]

## 2021-08-09 ENCOUNTER — Other Ambulatory Visit: Payer: Self-pay

## 2021-08-09 ENCOUNTER — Ambulatory Visit (INDEPENDENT_AMBULATORY_CARE_PROVIDER_SITE_OTHER): Payer: Medicare Other | Admitting: Family Medicine

## 2021-08-09 ENCOUNTER — Encounter: Payer: Self-pay | Admitting: Family Medicine

## 2021-08-09 VITALS — BP 118/71 | HR 74 | Temp 98.0°F | Ht 60.0 in | Wt 146.8 lb

## 2021-08-09 DIAGNOSIS — K219 Gastro-esophageal reflux disease without esophagitis: Secondary | ICD-10-CM

## 2021-08-09 DIAGNOSIS — J410 Simple chronic bronchitis: Secondary | ICD-10-CM

## 2021-08-09 DIAGNOSIS — N1831 Chronic kidney disease, stage 3a: Secondary | ICD-10-CM | POA: Diagnosis not present

## 2021-08-09 DIAGNOSIS — E782 Mixed hyperlipidemia: Secondary | ICD-10-CM | POA: Diagnosis not present

## 2021-08-09 DIAGNOSIS — I129 Hypertensive chronic kidney disease with stage 1 through stage 4 chronic kidney disease, or unspecified chronic kidney disease: Secondary | ICD-10-CM

## 2021-08-09 LAB — URINALYSIS, ROUTINE W REFLEX MICROSCOPIC
Bilirubin, UA: NEGATIVE
Glucose, UA: NEGATIVE
Ketones, UA: NEGATIVE
Leukocytes,UA: NEGATIVE
Nitrite, UA: NEGATIVE
Protein,UA: NEGATIVE
RBC, UA: NEGATIVE
Specific Gravity, UA: >1.03 — ABNORMAL HIGH (ref 1.005–1.030)
Urobilinogen, Ur: 0.2 mg/dL (ref 0.2–1.0)
pH, UA: 5.5 (ref 5.0–7.5)

## 2021-08-09 LAB — MICROALBUMIN, URINE WAIVED
Creatinine, Urine Waived: 200 mg/dL (ref 10–300)
Microalb, Ur Waived: 10 mg/L (ref 0–19)
Microalb/Creat Ratio: <30 mg/g (ref <30)

## 2021-08-09 MED ORDER — FLUTICASONE PROPIONATE 50 MCG/ACT NA SUSP: 2.0000 | Freq: Two times a day (BID) | NASAL | 4 refills | Status: DC

## 2021-08-09 MED ORDER — ESOMEPRAZOLE MAGNESIUM 40 MG PO CPDR: DELAYED_RELEASE_CAPSULE | ORAL | 3 refills | Status: DC

## 2021-08-09 MED ORDER — FLUTICASONE-SALMETEROL 250-50 MCG/ACT IN AEPB: INHALATION_SPRAY | RESPIRATORY_TRACT | 1 refills | Status: DC

## 2021-08-09 MED ORDER — ESTRADIOL 0.5 MG PO TABS: 0.2500 mg | ORAL_TABLET | Freq: Every day | ORAL | 1 refills | Status: DC

## 2021-08-09 MED ORDER — FENOFIBRATE MICRONIZED 130 MG PO CAPS: 130.0000 mg | ORAL_CAPSULE | Freq: Every day | ORAL | 1 refills | Status: DC

## 2021-08-09 MED ORDER — CETIRIZINE HCL 10 MG PO TABS: 10.0000 mg | ORAL_TABLET | Freq: Every day | ORAL | 3 refills | Status: DC

## 2021-08-09 MED ORDER — ALBUTEROL SULFATE HFA 108 (90 BASE) MCG/ACT IN AERS: 4.0000 | INHALATION_SPRAY | Freq: Four times a day (QID) | RESPIRATORY_TRACT | 1 refills | Status: DC | PRN

## 2021-08-09 MED ORDER — LISINOPRIL 5 MG PO TABS: 5.0000 mg | ORAL_TABLET | Freq: Every day | ORAL | 1 refills | Status: DC

## 2021-08-09 MED ORDER — EZETIMIBE 10 MG PO TABS: 10.0000 mg | ORAL_TABLET | Freq: Every day | ORAL | 3 refills | Status: DC

## 2021-08-09 NOTE — Assessment & Plan Note (Signed)
Under good control on current regimen. Continue current regimen. Continue to monitor. Call with any concerns. Refills given. Labs drawn today.   

## 2021-08-09 NOTE — Progress Notes (Signed)
? ?BP 118/71   Pulse 74   Temp 98 ?F (36.7 ?C) (Oral)   Ht 5' (1.524 m)   Wt 146 lb 12.8 oz (66.6 kg)   LMP 11/28/1971 (Approximate)   SpO2 98%   BMI 28.67 kg/m?   ? ?Subjective:  ? ? Patient ID: Karen Velasquez, female    DOB: 12/26/1930, 88 y.o.   MRN: [id number] ? ?HPI: ?Karen Velasquez is a 88 y.o. female ? ?Chief Complaint  ?Patient presents with  ? Hypertension  ? Hyperlipidemia  ? ?HYPERTENSION / HYPERLIPIDEMIA ?Satisfied with current treatment? yes ?Duration of hypertension: chronic ?BP monitoring frequency: not checking ?BP medication side effects: no ?Past BP meds: lisinopril ?Duration of hyperlipidemia: chronic ?Cholesterol medication side effects: no ?Cholesterol supplements: none ?Past cholesterol medications: zetia, fenofibrate ?Medication compliance: excellent compliance ?Aspirin: no ?Recent stressors: no ?Recurrent headaches: no ?Visual changes: no ?Palpitations: no ?Dyspnea: no ?Chest pain: no ?Lower extremity edema: no ?Dizzy/lightheaded: no ? ?GERD ?GERD control status: controlled ?Satisfied with current treatment? yes ?Heartburn frequency: occasionally ?Medication side effects: yes  ?Medication compliance: stable ?Previous GERD medications: ?Dysphagia: no ?Odynophagia:  no ?Hematemesis: no ?Blood in stool: no ?EGD: no ? ?Relevant past medical, surgical, family and social history reviewed and updated as indicated. Interim medical history since our last visit reviewed. ?Allergies and medications reviewed and updated. ? ?Review of Systems  ?Constitutional: Negative.   ?Respiratory: Negative.    ?Cardiovascular: Negative.   ?Gastrointestinal: Negative.   ?Musculoskeletal: Negative.   ?Neurological: Negative.   ?Psychiatric/Behavioral: Negative.    ? ?Per HPI unless specifically indicated above ? ?   ?Objective:  ?  ?BP 118/71   Pulse 74   Temp 98 ?F (36.7 ?C) (Oral)   Ht 5' (1.524 m)   Wt 146 lb 12.8 oz (66.6 kg)   LMP 11/28/1971 (Approximate)   SpO2 98%   BMI 28.67 kg/m?   ?Wt Readings  from Last 3 Encounters:  ?08/09/21 146 lb 12.8 oz (66.6 kg)  ?03/27/21 144 lb (65.3 kg)  ?12/25/20 145 lb (65.8 kg)  ?  ?Physical Exam ?Vitals and nursing note reviewed.  ?Constitutional:   ?   General: She is not in acute distress. ?   Appearance: Normal appearance. She is not ill-appearing, toxic-appearing or diaphoretic.  ?HENT:  ?   Head: Normocephalic and atraumatic.  ?   Right Ear: External ear normal.  ?   Left Ear: External ear normal.  ?   Nose: Nose normal.  ?   Mouth/Throat:  ?   Mouth: Mucous membranes are moist.  ?   Pharynx: Oropharynx is clear.  ?Eyes:  ?   General: No scleral icterus.    ?   Right eye: No discharge.     ?   Left eye: No discharge.  ?   Extraocular Movements: Extraocular movements intact.  ?   Conjunctiva/sclera: Conjunctivae normal.  ?   Pupils: Pupils are equal, round, and reactive to light.  ?Cardiovascular:  ?   Rate and Rhythm: Normal rate and regular rhythm.  ?   Pulses: Normal pulses.  ?   Heart sounds: Normal heart sounds. No murmur heard. ?  No friction rub. No gallop.  ?Pulmonary:  ?   Effort: Pulmonary effort is normal. No respiratory distress.  ?   Breath sounds: Normal breath sounds. No stridor. No wheezing, rhonchi or rales.  ?Chest:  ?   Chest wall: No tenderness.  ?Musculoskeletal:     ?   General: Normal range of motion.  ?  Cervical back: Normal range of motion and neck supple.  ?Skin: ?   General: Skin is warm and dry.  ?   Capillary Refill: Capillary refill takes less than 2 seconds.  ?   Coloration: Skin is not jaundiced or pale.  ?   Findings: No bruising, erythema, lesion or rash.  ?Neurological:  ?   General: No focal deficit present.  ?   Mental Status: She is alert and oriented to person, place, and time. Mental status is at baseline.  ?Psychiatric:     ?   Mood and Affect: Mood normal.     ?   Behavior: Behavior normal.     ?   Thought Content: Thought content normal.     ?   Judgment: Judgment normal.  ? ? ?Results for orders placed or performed in visit  on 08/09/21  ?Microalbumin, Urine Waived  ?Result Value Ref Range  ? Microalb, Ur Waived 10 0 - 19 mg/L  ? Creatinine, Urine Waived 200 10 - 300 mg/dL  ? Microalb/Creat Ratio <30 <30 mg/g  ?Urinalysis, Routine w reflex microscopic  ?Result Value Ref Range  ? Specific Gravity, UA >1.030 (H) 1.005 - 1.030  ? pH, UA 5.5 5.0 - 7.5  ? Color, UA Yellow Yellow  ? Appearance Ur Cloudy (A) Clear  ? Leukocytes,UA Negative Negative  ? Protein,UA Negative Negative/Trace  ? Glucose, UA Negative Negative  ? Ketones, UA Negative Negative  ? RBC, UA Negative Negative  ? Bilirubin, UA Negative Negative  ? Urobilinogen, Ur 0.2 0.2 - 1.0 mg/dL  ? Nitrite, UA Negative Negative  ? ?   ?Assessment & Plan:  ? ?Problem List Items Addressed This Visit   ? ?  ? Respiratory  ? COPD (chronic obstructive pulmonary disease) ([place]) - Primary  ?  Under good control on current regimen. Continue current regimen. Continue to monitor. Call with any concerns. Refills given. Labs drawn today. ? ?  ?  ? Relevant Medications  ? fluticasone-salmeterol (ADVAIR DISKUS) 250-50 MCG/ACT AEPB  ? albuterol (VENTOLIN HFA) 108 (90 Base) MCG/ACT inhaler  ? cetirizine (ZYRTEC) 10 MG tablet  ? fluticasone (FLONASE) 50 MCG/ACT nasal spray  ? Other Relevant Orders  ? Comprehensive metabolic panel  ?  ? Digestive  ? GERD (gastroesophageal reflux disease)  ?  Under good control on current regimen. Continue current regimen. Continue to monitor. Call with any concerns. Refills given. Labs drawn today. ?  ?  ? Relevant Medications  ? esomeprazole (NEXIUM) 40 MG capsule  ? Other Relevant Orders  ? CBC with Differential/Platelet  ? Comprehensive metabolic panel  ?  ? Genitourinary  ? Benign hypertensive renal disease  ?  Under good control on current regimen. Continue current regimen. Continue to monitor. Call with any concerns. Refills given. Labs drawn today. ?  ?  ? Relevant Orders  ? Comprehensive metabolic panel  ? Microalbumin, Urine Waived (Completed)  ? TSH  ? CKD  (chronic kidney disease) stage 3, GFR 30-59 ml/min (HCC)  ?  Rechecking labs today. Await results. Treat as needed.  ?  ?  ? Relevant Orders  ? Comprehensive metabolic panel  ? Urinalysis, Routine w reflex microscopic (Completed)  ?  ? Other  ? Hyperlipidemia  ?  Under good control on current regimen. Continue current regimen. Continue to monitor. Call with any concerns. Refills given. Labs drawn today. ?  ?  ? Relevant Medications  ? ezetimibe (ZETIA) 10 MG tablet  ? fenofibrate micronized (ANTARA) 130 MG  capsule  ? lisinopril (ZESTRIL) 5 MG tablet  ? Other Relevant Orders  ? Comprehensive metabolic panel  ? Lipid Panel w/o Chol/HDL Ratio  ?  ? ?Follow up plan: ?Return in about 6 months (around 02/09/2022). ? ? ? ? ? ?

## 2021-08-09 NOTE — Assessment & Plan Note (Signed)
Rechecking labs today. Await results. Treat as needed.  °

## 2021-08-10 LAB — CBC WITH DIFFERENTIAL/PLATELET
Basophils Absolute: 0 10*3/uL (ref 0.0–0.2)
Basos: 1 %
EOS (ABSOLUTE): 0.1 10*3/uL (ref 0.0–0.4)
Eos: 2 %
Hematocrit: 33.5 % — ABNORMAL LOW (ref 34.0–46.6)
Hemoglobin: 11.1 g/dL (ref 11.1–15.9)
Immature Grans (Abs): 0 10*3/uL (ref 0.0–0.1)
Immature Granulocytes: 0 %
Lymphocytes Absolute: 1.1 10*3/uL (ref 0.7–3.1)
Lymphs: 26 %
MCH: 29.7 pg (ref 26.6–33.0)
MCHC: 33.1 g/dL (ref 31.5–35.7)
MCV: 90 fL (ref 79–97)
Monocytes Absolute: 0.3 10*3/uL (ref 0.1–0.9)
Monocytes: 8 %
Neutrophils Absolute: 2.7 10*3/uL (ref 1.4–7.0)
Neutrophils: 63 %
Platelets: 196 10*3/uL (ref 150–450)
RBC: 3.74 x10E6/uL — ABNORMAL LOW (ref 3.77–5.28)
RDW: 13.5 % (ref 11.7–15.4)
WBC: 4.2 10*3/uL (ref 3.4–10.8)

## 2021-08-10 LAB — COMPREHENSIVE METABOLIC PANEL
ALT: 18 IU/L (ref 0–32)
AST: 17 IU/L (ref 0–40)
Albumin/Globulin Ratio: 1.9 (ref 1.2–2.2)
Albumin: 4.4 g/dL (ref 3.6–4.6)
Alkaline Phosphatase: 44 IU/L (ref 44–121)
BUN/Creatinine Ratio: 23 (ref 12–28)
BUN: 23 mg/dL (ref 8–27)
Bilirubin Total: 0.3 mg/dL (ref 0.0–1.2)
CO2: 24 mmol/L (ref 20–29)
Calcium: 9.5 mg/dL (ref 8.7–10.3)
Chloride: 105 mmol/L (ref 96–106)
Creatinine, Ser: 1.02 mg/dL — ABNORMAL HIGH (ref 0.57–1.00)
Globulin, Total: 2.3 g/dL (ref 1.5–4.5)
Glucose: 98 mg/dL (ref 70–99)
Potassium: 4.3 mmol/L (ref 3.5–5.2)
Sodium: 140 mmol/L (ref 134–144)
Total Protein: 6.7 g/dL (ref 6.0–8.5)
eGFR: 53 mL/min/{1.73_m2} — ABNORMAL LOW (ref >59)

## 2021-08-10 LAB — LIPID PANEL W/O CHOL/HDL RATIO
Cholesterol, Total: 144 mg/dL (ref 100–199)
HDL: 62 mg/dL (ref >39)
LDL Chol Calc (NIH): 70 mg/dL (ref 0–99)
Triglycerides: 55 mg/dL (ref 0–149)
VLDL Cholesterol Cal: 12 mg/dL (ref 5–40)

## 2021-08-10 LAB — TSH: TSH: 1.05 u[IU]/mL (ref 0.450–4.500)

## 2021-08-11 ENCOUNTER — Encounter: Payer: Self-pay | Admitting: Family Medicine

## 2021-09-10 ENCOUNTER — Ambulatory Visit: Payer: Medicare Other

## 2021-09-10 ENCOUNTER — Telehealth: Payer: Self-pay | Admitting: Family Medicine

## 2021-09-10 NOTE — Telephone Encounter (Signed)
Copied from [place] [id number]. Topic: General - Other ?>> Sep 10, 2021 11:29 AM Leward Quan A wrote: ?Reason for CRM: Patient daughter Ulice Bold called in to say that patient did not receive a call at 9 AM but someone called her Bridgett while she was not able to answer since she was at the dentist. Please advise and call patient to reschedule Ph#  [phone] Bridgett Ph#    [phone] ?

## 2021-09-11 ENCOUNTER — Ambulatory Visit (INDEPENDENT_AMBULATORY_CARE_PROVIDER_SITE_OTHER): Payer: Medicare Other | Admitting: *Deleted

## 2021-09-11 DIAGNOSIS — Z0000 Encounter for general adult medical examination without abnormal findings: Secondary | ICD-10-CM

## 2021-09-11 NOTE — Telephone Encounter (Signed)
Pt rescheduled for today @ 12:30. Please call pt at [phone]. ?

## 2021-09-11 NOTE — Progress Notes (Signed)
? ?Subjective:  ? Karen Velasquez is a 88 y.o. female who presents for Medicare Annual (Subsequent) preventive examination. ? ?I connected with  [name] on 09/11/21 by a telephone enabled telemedicine application and verified that I am speaking with the correct person using two identifiers. ?  ?I discussed the limitations of evaluation and management by telemedicine. The patient expressed understanding and agreed to proceed. ? ?Patient location: home ? ?Provider location: in office ? ? ? ?Review of Systems    ? ?Cardiac Risk Factors include: advanced age (>9mn, >>65women);hypertension;sedentary lifestyle ? ?   ?Objective:  ?  ?Today's Vitals  ? ?There is no height or weight on file to calculate BMI. ? ? ?  09/11/2021  ? 11:28 AM 04/29/2019  ? 11:25 AM 02/24/2017  ?  8:05 AM 02/25/2016  ?  8:10 AM 12/29/2014  ?  9:56 AM 11/22/2014  ? 10:16 AM  ?Advanced Directives  ?Does Patient Have a Medical Advance Directive? Yes Yes Yes Yes Yes Yes  ?Type of AParamedicof Attorney Living will HKayceeLiving will HFuigLiving will Healthcare Power of APollock ?Does patient want to make changes to medical advance directive?     No - Patient declined No - Patient declined  ?Copy of HBabbiein Chart? No - copy requested  Yes   No - copy requested  ?Would patient like information on creating a medical advance directive?  No - Patient declined      ? ? ?Current Medications (verified) ?Outpatient Encounter Medications as of 09/11/2021  ?Medication Sig  ? albuterol (VENTOLIN HFA) 108 (90 Base) MCG/ACT inhaler Inhale 4 puffs into the lungs every 6 (six) hours as needed for wheezing or shortness of breath.  ? Calcium Carbonate-Vitamin D 600-400 MG-UNIT per tablet Take 1 tablet by mouth daily.   ? cetirizine (ZYRTEC) 10 MG tablet Take 1 tablet (10 mg total) by mouth daily.  ? diclofenac Sodium (VOLTAREN) 1 % GEL   ?  esomeprazole (NEXIUM) 40 MG capsule TAKE 1 CAPSULE DAILY AT 12 NOON  ? estradiol (ESTRACE) 0.5 MG tablet Take 0.5 tablets (0.25 mg total) by mouth daily.  ? EXTRA STRENGTH ACETAMINOPHEN PO Take 1 tablet by mouth every 6 (six) hours as needed.  ? ezetimibe (ZETIA) 10 MG tablet Take 1 tablet (10 mg total) by mouth daily.  ? fenofibrate micronized (ANTARA) 130 MG capsule Take 1 capsule (130 mg total) by mouth daily before breakfast.  ? fluticasone (FLONASE) 50 MCG/ACT nasal spray Place 2 sprays into both nostrils 2 (two) times daily.  ? fluticasone-salmeterol (ADVAIR DISKUS) 250-50 MCG/ACT AEPB USE 1 INHALATION TWICE A DAY  ? lisinopril (ZESTRIL) 5 MG tablet Take 1 tablet (5 mg total) by mouth daily.  ? meclizine (ANTIVERT) 25 MG tablet Take 1 tablet (25 mg total) by mouth 3 (three) times daily as needed for dizziness.  ? prednisoLONE acetate (PRED FORTE) 1 % ophthalmic suspension SMARTSIG:In Eye(s)  ? Spacer/Aero-Holding Chambers DEVI Use the spacer with the albuterol inhaler to help deliver the puffs.  ? triamcinolone ointment (KENALOG) 0.5 % Apply 1 application topically 2 (two) times daily.  ? vitamin C (ASCORBIC ACID) 500 MG tablet Take by mouth.  ? ?No facility-administered encounter medications on file as of 09/11/2021.  ? ? ?Allergies (verified) ?Patient has no known allergies.  ? ?History: ?Past Medical History:  ?Diagnosis Date  ? [place]   ?  Hyperlipidemia   ? Hypertension   ? ?Past Surgical History:  ?Procedure Laterality Date  ? ABDOMINAL HYSTERECTOMY    ? APPENDECTOMY    ? 2105  ? cornea implant    ? 3,4 yrs ago  ? PARTIAL HYSTERECTOMY    ? 55yr of age  ? ROTATOR CUFF REPAIR    ? 89yrgo  ? TONSILLECTOMY    ? as a child  ? X-STOP IMPLANTATION N/A   ? [id number]  ? ?Family History  ?Problem Relation Age of Onset  ? Diabetes Father   ? Cancer Father   ? Cancer Sister   ? ?Social History  ? ?Socioeconomic History  ? Marital status: Widowed  ?  Spouse name: Not on file   ? Number of children: 1  ? Years of education: Not on file  ? Highest education level: Not on file  ?Occupational History  ? Occupation: retired  ?Tobacco Use  ? Smoking status: Former  ?  Packs/day: 0.50  ?  Types: Cigarettes  ?  Quit date: 08/25/2016  ?  Years since quitting: 5.0  ? Smokeless tobacco: Never  ?Vaping Use  ? Vaping Use: Never used  ?Substance and Sexual Activity  ? Alcohol use: No  ?  Alcohol/week: 0.0 standard drinks  ? Drug use: No  ? Sexual activity: Not Currently  ?Other Topics Concern  ? Not on file  ?Social History Narrative  ? Not on file  ? ?Social Determinants of Health  ? ?Financial Resource Strain: Low Risk   ? Difficulty of Paying Living Expenses: Not hard at all  ?Food Insecurity: No Food Insecurity  ? Worried About [employer] fundraiser the Last Year: Never true  ? Ran Out of Food in the Last Year: Never true  ?Transportation Needs: No Transportation Needs  ? Lack of Transportation (Medical): No  ? Lack of Transportation (Non-Medical): No  ?Physical Activity: Inactive  ? Days of Exercise per Week: 0 days  ? Minutes of Exercise per Session: 0 min  ?Stress: No Stress Concern Present  ? Feeling of Stress : Not at all  ?Social Connections: Moderately Integrated  ? Frequency of Communication with Friends and Family: More than three times a week  ? Frequency of Social Gatherings with Friends and Family: Twice a week  ? Attends Religious Services: More than 4 times per year  ? Active Member of Clubs or Organizations: Yes  ? Attends CluArchivistetings: More than 4 times per year  ? Marital Status: Widowed  ? ? ?Tobacco Counseling ?Counseling given: Not Answered ? ? ?Clinical Intake: ? ?Pre-visit preparation completed: Yes ? ?Pain : No/denies pain ? ?  ? ?Nutritional Risks: None ?Diabetes: No ? ?How often do you need to have someone help you when you read instructions, pamphlets, or other written materials from your doctor or pharmacy?: 1 - Never ? ?Diabetic?  no ? ?Interpreter  Needed?: No ? ?Information entered by :: JulLeroy KennedyN ? ? ?Activities of Daily Living ? ?  09/11/2021  ? 11:32 AM  ?In your present state of health, do you have any difficulty performing the following activities:  ?Hearing? 1  ?Vision? 0  ?Difficulty concentrating or making decisions? 0  ?Walking or climbing stairs? 1  ?Dressing or bathing? 0  ?Doing errands, shopping? 0  ?Preparing Food and eating ? N  ?Using the Toilet? N  ?In the past six months, have you accidently leaked urine? N  ?Do you have problems with  loss of bowel control? N  ?Managing your Medications? N  ?Managing your Finances? N  ?Housekeeping or managing your Housekeeping? N  ? ? ?Patient Care Team: ?Valerie Roys, DO as PCP - General (Family Medicine) ?Zara Council as Physician Assistant (Orthopedic Surgery) ? ?Indicate any recent Medical Services you may have received from other than [facility] providers in the past year (date may be approximate). ? ?   ?Assessment:  ? This is a routine wellness examination for Raliyah. ? ?Hearing/Vision screen ?Hearing Screening - Comments:: Slight hearing loss ?Does not wear hearing aids ?Vision Screening - Comments:: Up to date ?Has cornea implant in right eye ?[place] doctor ?[place] ? ? ?Dietary issues and exercise activities discussed: ?Current Exercise Habits: The patient does not participate in regular exercise at present ? ? Goals Addressed   ? ?  ?  ?  ?  ? This Visit's Progress  ?  Patient Stated     ?  Continue current lifestyle ?  ? ?  ? ?Depression Screen ? ?  09/11/2021  ? 11:35 AM 08/09/2021  ?  8:08 AM 07/31/2020  ?  8:46 AM 03/28/2019  ?  9:05 AM 03/22/2018  ?  8:06 AM 02/24/2017  ?  8:14 AM 02/25/2016  ?  8:21 AM  ?PHQ 2/9 Scores  ?PHQ - 2 Score 0  0 0 0 0 0  ?PHQ- 9 Score    0  1   ?Exception Documentation  Patient refusal       ?  ?Fall Risk ? ?  09/11/2021  ? 11:28 AM 08/09/2021  ?  8:08 AM 07/31/2020  ?  8:52 AM 07/31/2020  ?  8:46 AM 07/31/2020  ?  8:45 AM  ?Fall Risk   ?Falls in the past year? 0 0  1 0 0  ?Number falls in past yr: 0 0 1 0 0  ?Injury with Fall? 0 0 0 0 0  ?Risk for fall due to :  No Fall Risks History of fall(s) Impaired balance/gait No Fall Risks  ?Follow up Falls evaluation completed;E

## 2021-09-11 NOTE — Patient Instructions (Signed)
Ms. Milson , ?Thank you for taking time to come for your Medicare Wellness Visit. I appreciate your ongoing commitment to your health goals. Please review the following plan we discussed and let me know if I can assist you in the future.  ? ?Screening recommendations/referrals: ?Colonoscopy: no longer required ?Mammogram: no longer required ?Bone Density: up to date ?Recommended yearly ophthalmology/optometry visit for glaucoma screening and checkup ?Recommended yearly dental visit for hygiene and checkup ? ?Vaccinations: ?Influenza vaccine: up to date ?Pneumococcal vaccine: up to date ?Tdap vaccine: up to date ?Shingles vaccine: Education provided   ? ?Advanced directives: copy requested ? ?Conditions/risks identified:  ? ?Next appointment: 02-11-2022 @ 8:40   Wynetta Emery ? ? ?Preventive Care 13 Years and Older, Female ?Preventive care refers to lifestyle choices and visits with your health care provider that can promote health and wellness. ?What does preventive care include? ?A yearly physical exam. This is also called an annual well check. ?Dental exams once or twice a year. ?Routine eye exams. Ask your health care provider how often you should have your eyes checked. ?Personal lifestyle choices, including: ?Daily care of your teeth and gums. ?Regular physical activity. ?Eating a healthy diet. ?Avoiding tobacco and drug use. ?Limiting alcohol use. ?Practicing safe sex. ?Taking low-dose aspirin every day. ?Taking vitamin and mineral supplements as recommended by your health care provider. ?What happens during an annual well check? ?The services and screenings done by your health care provider during your annual well check will depend on your age, overall health, lifestyle risk factors, and family history of disease. ?Counseling  ?Your health care provider may ask you questions about your: ?Alcohol use. ?Tobacco use. ?Drug use. ?Emotional well-being. ?Home and relationship well-being. ?Sexual activity. ?Eating  habits. ?History of falls. ?Memory and ability to understand (cognition). ?Work and work Statistician. ?Reproductive health. ?Screening  ?You may have the following tests or measurements: ?Height, weight, and BMI. ?Blood pressure. ?Lipid and cholesterol levels. These may be checked every 5 years, or more frequently if you are over 15 years old. ?Skin check. ?Lung cancer screening. You may have this screening every year starting at age 18 if you have a 30-pack-year history of smoking and currently smoke or have quit within the past 15 years. ?Fecal occult blood test (FOBT) of the stool. You may have this test every year starting at age 34. ?Flexible sigmoidoscopy or colonoscopy. You may have a sigmoidoscopy every 5 years or a colonoscopy every 10 years starting at age 65. ?Hepatitis C blood test. ?Hepatitis B blood test. ?Sexually transmitted disease (STD) testing. ?Diabetes screening. This is done by checking your blood sugar (glucose) after you have not eaten for a while (fasting). You may have this done every 1-3 years. ?Bone density scan. This is done to screen for osteoporosis. You may have this done starting at age 81. ?Mammogram. This may be done every 1-2 years. Talk to your health care provider about how often you should have regular mammograms. ?Talk with your health care provider about your test results, treatment options, and if necessary, the need for more tests. ?Vaccines  ?Your health care provider may recommend certain vaccines, such as: ?Influenza vaccine. This is recommended every year. ?Tetanus, diphtheria, and acellular pertussis (Tdap, Td) vaccine. You may need a Td booster every 10 years. ?Zoster vaccine. You may need this after age 39. ?Pneumococcal 13-valent conjugate (PCV13) vaccine. One dose is recommended after age 20. ?Pneumococcal polysaccharide (PPSV23) vaccine. One dose is recommended after age 65. ?Talk to your health  care provider about which screenings and vaccines you need and how  often you need them. ?This information is not intended to replace advice given to you by your health care provider. Make sure you discuss any questions you have with your health care provider. ?Document Released: 06/22/2015 Document Revised: 02/13/2016 Document Reviewed: 03/27/2015 ?Elsevier Interactive Patient Education ? 2017 [place]. ? ?Fall Prevention in the Home ?Falls can cause injuries. They can happen to people of all ages. There are many things you can do to make your home safe and to help prevent falls. ?What can I do on the outside of my home? ?Regularly fix the edges of walkways and driveways and fix any cracks. ?Remove anything that might make you trip as you walk through a door, such as a raised step or threshold. ?Trim any bushes or trees on the path to your home. ?Use bright outdoor lighting. ?Clear any walking paths of anything that might make someone trip, such as rocks or tools. ?Regularly check to see if handrails are loose or broken. Make sure that both sides of any steps have handrails. ?Any raised decks and porches should have guardrails on the edges. ?Have any leaves, snow, or ice cleared regularly. ?Use sand or salt on walking paths during winter. ?Clean up any spills in your garage right away. This includes oil or grease spills. ?What can I do in the bathroom? ?Use night lights. ?Install grab bars by the toilet and in the tub and shower. Do not use towel bars as grab bars. ?Use non-skid mats or decals in the tub or shower. ?If you need to sit down in the shower, use a plastic, non-slip stool. ?Keep the floor dry. Clean up any water that spills on the floor as soon as it happens. ?Remove soap buildup in the tub or shower regularly. ?Attach bath mats securely with double-sided non-slip rug tape. ?Do not have throw rugs and other things on the floor that can make you trip. ?What can I do in the bedroom? ?Use night lights. ?Make sure that you have a light by your bed that is easy to  reach. ?Do not use any sheets or blankets that are too big for your bed. They should not hang down onto the floor. ?Have a firm chair that has side arms. You can use this for support while you get dressed. ?Do not have throw rugs and other things on the floor that can make you trip. ?What can I do in the kitchen? ?Clean up any spills right away. ?Avoid walking on wet floors. ?Keep items that you use a lot in easy-to-reach places. ?If you need to reach something above you, use a strong step stool that has a grab bar. ?Keep electrical cords out of the way. ?Do not use floor polish or wax that makes floors slippery. If you must use wax, use non-skid floor wax. ?Do not have throw rugs and other things on the floor that can make you trip. ?What can I do with my stairs? ?Do not leave any items on the stairs. ?Make sure that there are handrails on both sides of the stairs and use them. Fix handrails that are broken or loose. Make sure that handrails are as long as the stairways. ?Check any carpeting to make sure that it is firmly attached to the stairs. Fix any carpet that is loose or worn. ?Avoid having throw rugs at the top or bottom of the stairs. If you do have throw rugs, attach them to  the floor with carpet tape. ?Make sure that you have a light switch at the top of the stairs and the bottom of the stairs. If you do not have them, ask someone to add them for you. ?What else can I do to help prevent falls? ?Wear shoes that: ?Do not have high heels. ?Have rubber bottoms. ?Are comfortable and fit you well. ?Are closed at the toe. Do not wear sandals. ?If you use a stepladder: ?Make sure that it is fully opened. Do not climb a closed stepladder. ?Make sure that both sides of the stepladder are locked into place. ?Ask someone to hold it for you, if possible. ?Clearly mark and make sure that you can see: ?Any grab bars or handrails. ?First and last steps. ?Where the edge of each step is. ?Use tools that help you move  around (mobility aids) if they are needed. These include: ?Canes. ?Walkers. ?Scooters. ?Crutches. ?Turn on the lights when you go into a dark area. Replace any light bulbs as soon as they burn out. ?Set up your furniture

## 2021-09-11 NOTE — Telephone Encounter (Signed)
LM for Karen Velasquez to call me back to reschedule.  ?Also tried calling pt, and had to LM on answering machine. ?

## 2021-11-03 ENCOUNTER — Other Ambulatory Visit: Payer: Self-pay | Admitting: Family Medicine

## 2021-11-05 NOTE — Telephone Encounter (Signed)
Requested Prescriptions  Pending Prescriptions Disp Refills  . lisinopril (ZESTRIL) 5 MG tablet [Pharmacy Med Name: LISINOPRIL TABS '5MG'$ ] 90 tablet 1    Sig: TAKE 1 TABLET DAILY     Cardiovascular:  ACE Inhibitors Failed - 11/03/2021  9:44 AM      Failed - Cr in normal range and within 180 days    Creatinine, Ser  Date Value Ref Range Status  08/09/2021 1.02 (H) 0.57 - 1.00 mg/dL Final         Passed - K in normal range and within 180 days    Potassium  Date Value Ref Range Status  08/09/2021 4.3 3.5 - 5.2 mmol/L Final         Passed - Patient is not pregnant      Passed - Last BP in normal range    BP Readings from Last 1 Encounters:  08/09/21 118/71         Passed - Valid encounter within last 6 months    Recent Outpatient Visits          2 months ago Simple chronic bronchitis ([place])   [place], Megan P, DO   7 months ago Benign hypertensive renal disease   [facility] [place], Megan P, DO   10 months ago Benign hypertensive renal disease   [facility] [place], Megan P, DO   11 months ago Abnormal kidney function   [facility], [place], DO   12 months ago Dizziness   [facility], [place], DO      Future Appointments            In 3 months Johnson, Megan P, DO [place], [facility]           . esomeprazole ([place]) 40 MG capsule [Pharmacy Med Name: ESOMEPRAZOLE MAG DR CAPS '40MG'$ ] 90 capsule 3    Sig: TAKE 1 CAPSULE DAILY AT 12 NOON     Gastroenterology: Proton Pump Inhibitors 2 Passed - 11/03/2021  9:44 AM      Passed - ALT in normal range and within 360 days    ALT  Date Value Ref Range Status  08/09/2021 18 0 - 32 IU/L Final         Passed - AST in normal range and within 360 days    AST  Date Value Ref Range Status  08/09/2021 17 0 - 40 IU/L Final         Passed - Valid encounter within last 12 months    Recent Outpatient Visits          2 months ago  Simple chronic bronchitis ([place])   [place], Megan P, DO   7 months ago Benign hypertensive renal disease   [facility] [place], Megan P, DO   10 months ago Benign hypertensive renal disease   [facility] [place], Megan P, DO   11 months ago Abnormal kidney function   [facility], [place], DO   12 months ago Dizziness   [employer] [facility], [place], DO      Future Appointments            In 3 months Johnson, Barb Merino, DO [facility], [facility]

## 2021-12-23 DIAGNOSIS — M5416 Radiculopathy, lumbar region: Secondary | ICD-10-CM | POA: Diagnosis not present

## 2022-02-07 ENCOUNTER — Other Ambulatory Visit: Payer: Self-pay

## 2022-02-10 MED ORDER — FLUTICASONE PROPIONATE 50 MCG/ACT NA SUSP: 2.0000 | Freq: Two times a day (BID) | NASAL | 4 refills | Status: DC

## 2022-02-11 ENCOUNTER — Encounter: Payer: Self-pay | Admitting: Family Medicine

## 2022-02-11 ENCOUNTER — Ambulatory Visit (INDEPENDENT_AMBULATORY_CARE_PROVIDER_SITE_OTHER): Payer: Medicare Other | Admitting: Family Medicine

## 2022-02-11 VITALS — BP 145/74 | HR 75 | Temp 98.1°F | Wt 144.8 lb

## 2022-02-11 DIAGNOSIS — I129 Hypertensive chronic kidney disease with stage 1 through stage 4 chronic kidney disease, or unspecified chronic kidney disease: Secondary | ICD-10-CM | POA: Diagnosis not present

## 2022-02-11 DIAGNOSIS — K219 Gastro-esophageal reflux disease without esophagitis: Secondary | ICD-10-CM | POA: Diagnosis not present

## 2022-02-11 DIAGNOSIS — N1831 Chronic kidney disease, stage 3a: Secondary | ICD-10-CM

## 2022-02-11 DIAGNOSIS — E782 Mixed hyperlipidemia: Secondary | ICD-10-CM

## 2022-02-11 NOTE — Assessment & Plan Note (Signed)
Rechecking labs today. Await results. Treat as needed.  °

## 2022-02-11 NOTE — Progress Notes (Signed)
BP (!) 145/74   Pulse 75   Temp 98.1 F (36.7 C)   Wt 144 lb 12.8 oz (65.7 kg)   LMP 11/28/1971 (Approximate)   SpO2 97%   BMI 28.28 kg/m    Subjective:    Patient ID: Karen Velasquez, female    DOB: 18-Sep-1930, 88 y.o.   MRN: [id number]  HPI: Karen Velasquez is a 88 y.o. female  Chief Complaint  Patient presents with   Hypertension   Gastroesophageal Reflux   Chronic Kidney Disease   Hyperlipidemia   HYPERTENSION / HYPERLIPIDEMIA Satisfied with current treatment? yes Duration of hypertension: chronic BP medication side effects: no Past BP meds: lisinopril Duration of hyperlipidemia: chronic Cholesterol medication side effects: no Cholesterol supplements: none Past cholesterol medications: fenofibrate, zetia Medication compliance: excellent compliance Aspirin: no Recent stressors: no Recurrent headaches: no Visual changes: no Palpitations: no Dyspnea: no Chest pain: no Lower extremity edema: no Dizzy/lightheaded: no  GERD GERD control status: controlled Satisfied with current treatment? no Heartburn frequency: occasionally Medication side effects: no  Medication compliance: excellent Dysphagia: no Odynophagia:  no Hematemesis: no Blood in stool: no EGD: no   Relevant past medical, surgical, family and social history reviewed and updated as indicated. Interim medical history since our last visit reviewed. Allergies and medications reviewed and updated.  Review of Systems  Constitutional: Negative.   Respiratory: Negative.    Cardiovascular: Negative.   Gastrointestinal: Negative.   Musculoskeletal: Negative.   Neurological: Negative.   Psychiatric/Behavioral: Negative.      Per HPI unless specifically indicated above     Objective:    BP (!) 145/74   Pulse 75   Temp 98.1 F (36.7 C)   Wt 144 lb 12.8 oz (65.7 kg)   LMP 11/28/1971 (Approximate)   SpO2 97%   BMI 28.28 kg/m   Wt Readings from Last 3 Encounters:  02/11/22 144 lb 12.8 oz  (65.7 kg)  08/09/21 146 lb 12.8 oz (66.6 kg)  03/27/21 144 lb (65.3 kg)    Physical Exam Vitals and nursing note reviewed.  Constitutional:      General: She is not in acute distress.    Appearance: Normal appearance. She is normal weight. She is not ill-appearing, toxic-appearing or diaphoretic.  HENT:     Head: Normocephalic and atraumatic.     Right Ear: External ear normal.     Left Ear: External ear normal.     Nose: Nose normal.     Mouth/Throat:     Mouth: Mucous membranes are moist.     Pharynx: Oropharynx is clear.  Eyes:     General: No scleral icterus.       Right eye: No discharge.        Left eye: No discharge.     Extraocular Movements: Extraocular movements intact.     Conjunctiva/sclera: Conjunctivae normal.     Pupils: Pupils are equal, round, and reactive to light.  Cardiovascular:     Rate and Rhythm: Normal rate and regular rhythm.     Pulses: Normal pulses.     Heart sounds: Normal heart sounds. No murmur heard.    No friction rub. No gallop.  Pulmonary:     Effort: Pulmonary effort is normal. No respiratory distress.     Breath sounds: Normal breath sounds. No stridor. No wheezing, rhonchi or rales.  Chest:     Chest wall: No tenderness.  Musculoskeletal:        General: Normal range of motion.  Cervical back: Normal range of motion and neck supple.  Skin:    General: Skin is warm and dry.     Capillary Refill: Capillary refill takes less than 2 seconds.     Coloration: Skin is not jaundiced or pale.     Findings: No bruising, erythema, lesion or rash.  Neurological:     General: No focal deficit present.     Mental Status: She is alert and oriented to person, place, and time. Mental status is at baseline.  Psychiatric:        Mood and Affect: Mood normal.        Behavior: Behavior normal.        Thought Content: Thought content normal.        Judgment: Judgment normal.     Results for orders placed or performed in visit on 08/09/21  CBC  with Differential/Platelet  Result Value Ref Range   WBC 4.2 3.4 - 10.8 x10E3/uL   RBC 3.74 (L) 3.77 - 5.28 x10E6/uL   Hemoglobin 11.1 11.1 - 15.9 g/dL   Hematocrit 33.5 (L) 34.0 - 46.6 %   MCV 90 79 - 97 fL   MCH 29.7 26.6 - 33.0 pg   MCHC 33.1 31.5 - 35.7 g/dL   RDW 13.5 11.7 - 15.4 %   Platelets 196 150 - 450 x10E3/uL   Neutrophils 63 Not Estab. %   Lymphs 26 Not Estab. %   Monocytes 8 Not Estab. %   Eos 2 Not Estab. %   Basos 1 Not Estab. %   Neutrophils Absolute 2.7 1.4 - 7.0 x10E3/uL   Lymphocytes Absolute 1.1 0.7 - 3.1 x10E3/uL   Monocytes Absolute 0.3 0.1 - 0.9 x10E3/uL   EOS (ABSOLUTE) 0.1 0.0 - 0.4 x10E3/uL   Basophils Absolute 0.0 0.0 - 0.2 x10E3/uL   Immature Granulocytes 0 Not Estab. %   Immature Grans (Abs) 0.0 0.0 - 0.1 x10E3/uL  Comprehensive metabolic panel  Result Value Ref Range   Glucose 98 70 - 99 mg/dL   BUN 23 8 - 27 mg/dL   Creatinine, Ser 1.02 (H) 0.57 - 1.00 mg/dL   eGFR 53 (L) >59 mL/min/1.73   BUN/Creatinine Ratio 23 12 - 28   Sodium 140 134 - 144 mmol/L   Potassium 4.3 3.5 - 5.2 mmol/L   Chloride 105 96 - 106 mmol/L   CO2 24 20 - 29 mmol/L   Calcium 9.5 8.7 - 10.3 mg/dL   Total Protein 6.7 6.0 - 8.5 g/dL   Albumin 4.4 3.6 - 4.6 g/dL   Globulin, Total 2.3 1.5 - 4.5 g/dL   Albumin/Globulin Ratio 1.9 1.2 - 2.2   Bilirubin Total 0.3 0.0 - 1.2 mg/dL   Alkaline Phosphatase 44 44 - 121 IU/L   AST 17 0 - 40 IU/L   ALT 18 0 - 32 IU/L  Lipid Panel w/o Chol/HDL Ratio  Result Value Ref Range   Cholesterol, Total 144 100 - 199 mg/dL   Triglycerides 55 0 - 149 mg/dL   HDL 62 >39 mg/dL   VLDL Cholesterol Cal 12 5 - 40 mg/dL   LDL Chol Calc (NIH) 70 0 - 99 mg/dL  Microalbumin, Urine Waived  Result Value Ref Range   Microalb, Ur Waived 10 0 - 19 mg/L   Creatinine, Urine Waived 200 10 - 300 mg/dL   Microalb/Creat Ratio <30 <30 mg/g  TSH  Result Value Ref Range   TSH 1.050 0.450 - 4.500 uIU/mL  Urinalysis, Routine w reflex microscopic  Result Value  Ref Range   Specific Gravity, UA >1.030 (H) 1.005 - 1.030   pH, UA 5.5 5.0 - 7.5   Color, UA Yellow Yellow   Appearance Ur Cloudy (A) Clear   Leukocytes,UA Negative Negative   Protein,UA Negative Negative/Trace   Glucose, UA Negative Negative   Ketones, UA Negative Negative   RBC, UA Negative Negative   Bilirubin, UA Negative Negative   Urobilinogen, Ur 0.2 0.2 - 1.0 mg/dL   Nitrite, UA Negative Negative      Assessment & Plan:   Problem List Items Addressed This Visit       Digestive   GERD (gastroesophageal reflux disease)    Under good control on current regimen. Continue current regimen. Continue to monitor. Call with any concerns. Refills up to date.        Relevant Orders   Comprehensive metabolic panel   CBC with Differential/Platelet     Genitourinary   Benign hypertensive renal disease - Primary    Running high because she's upset about her medications. Continue current regimen. Continue to monitor. Call with any concerns. Continue to monitor.      Relevant Orders   Comprehensive metabolic panel   CBC with Differential/Platelet   CKD (chronic kidney disease) stage 3, GFR 30-59 ml/min (HCC)    Rechecking labs today. Await results. Treat as needed.       Relevant Orders   Comprehensive metabolic panel   CBC with Differential/Platelet     Other   Hyperlipidemia    Under good control on current regimen. Continue current regimen. Continue to monitor. Call with any concerns. Refills given. Labs drawn today.       Relevant Orders   Comprehensive metabolic panel   CBC with Differential/Platelet   Lipid Panel w/o Chol/HDL Ratio     Follow up plan: Return in about 6 months (around 08/12/2022).

## 2022-02-11 NOTE — Assessment & Plan Note (Signed)
Under good control on current regimen. Continue current regimen. Continue to monitor. Call with any concerns. Refills given. Labs drawn today.   

## 2022-02-11 NOTE — Assessment & Plan Note (Signed)
Running high because she's upset about her medications. Continue current regimen. Continue to monitor. Call with any concerns. Continue to monitor.

## 2022-02-11 NOTE — Assessment & Plan Note (Signed)
Under good control on current regimen. Continue current regimen. Continue to monitor. Call with any concerns. Refills up to date.   

## 2022-02-12 ENCOUNTER — Encounter: Payer: Self-pay | Admitting: Family Medicine

## 2022-02-12 LAB — CBC WITH DIFFERENTIAL/PLATELET
Basophils Absolute: 0 10*3/uL (ref 0.0–0.2)
Basos: 1 %
EOS (ABSOLUTE): 0.1 10*3/uL (ref 0.0–0.4)
Eos: 2 %
Hematocrit: 34.6 % (ref 34.0–46.6)
Hemoglobin: 11.1 g/dL (ref 11.1–15.9)
Immature Grans (Abs): 0 10*3/uL (ref 0.0–0.1)
Immature Granulocytes: 0 %
Lymphocytes Absolute: 1 10*3/uL (ref 0.7–3.1)
Lymphs: 24 %
MCH: 29.6 pg (ref 26.6–33.0)
MCHC: 32.1 g/dL (ref 31.5–35.7)
MCV: 92 fL (ref 79–97)
Monocytes Absolute: 0.3 10*3/uL (ref 0.1–0.9)
Monocytes: 7 %
Neutrophils Absolute: 2.7 10*3/uL (ref 1.4–7.0)
Neutrophils: 66 %
Platelets: 178 10*3/uL (ref 150–450)
RBC: 3.75 x10E6/uL — ABNORMAL LOW (ref 3.77–5.28)
RDW: 13.4 % (ref 11.7–15.4)
WBC: 4.1 10*3/uL (ref 3.4–10.8)

## 2022-02-12 LAB — COMPREHENSIVE METABOLIC PANEL
ALT: 12 IU/L (ref 0–32)
AST: 16 IU/L (ref 0–40)
Albumin/Globulin Ratio: 2 (ref 1.2–2.2)
Albumin: 4.7 g/dL (ref 3.7–4.7)
Alkaline Phosphatase: 42 IU/L — ABNORMAL LOW (ref 44–121)
BUN/Creatinine Ratio: 21 (ref 12–28)
BUN: 21 mg/dL (ref 8–27)
Bilirubin Total: 0.4 mg/dL (ref 0.0–1.2)
CO2: 22 mmol/L (ref 20–29)
Calcium: 9.6 mg/dL (ref 8.7–10.3)
Chloride: 109 mmol/L — ABNORMAL HIGH (ref 96–106)
Creatinine, Ser: 0.99 mg/dL (ref 0.57–1.00)
Globulin, Total: 2.3 g/dL (ref 1.5–4.5)
Glucose: 97 mg/dL (ref 70–99)
Potassium: 4.2 mmol/L (ref 3.5–5.2)
Sodium: 145 mmol/L — ABNORMAL HIGH (ref 134–144)
Total Protein: 7 g/dL (ref 6.0–8.5)
eGFR: 55 mL/min/{1.73_m2} — ABNORMAL LOW (ref >59)

## 2022-02-12 LAB — LIPID PANEL W/O CHOL/HDL RATIO
Cholesterol, Total: 158 mg/dL (ref 100–199)
HDL: 61 mg/dL (ref >39)
LDL Chol Calc (NIH): 79 mg/dL (ref 0–99)
Triglycerides: 96 mg/dL (ref 0–149)
VLDL Cholesterol Cal: 18 mg/dL (ref 5–40)

## 2022-03-05 DIAGNOSIS — H01001 Unspecified blepharitis right upper eyelid: Secondary | ICD-10-CM | POA: Diagnosis not present

## 2022-03-05 DIAGNOSIS — H2512 Age-related nuclear cataract, left eye: Secondary | ICD-10-CM | POA: Diagnosis not present

## 2022-03-05 DIAGNOSIS — H01004 Unspecified blepharitis left upper eyelid: Secondary | ICD-10-CM | POA: Diagnosis not present

## 2022-03-18 DIAGNOSIS — Z23 Encounter for immunization: Secondary | ICD-10-CM | POA: Diagnosis not present

## 2022-03-20 ENCOUNTER — Other Ambulatory Visit: Payer: Self-pay | Admitting: Family Medicine

## 2022-03-20 NOTE — Telephone Encounter (Signed)
Requested Prescriptions  Pending Prescriptions Disp Refills  . estradiol (ESTRACE) 0.5 MG tablet [Pharmacy Med Name: ESTRADIOL TABS 0.'5MG'$ ] 45 tablet 1    Sig: TAKE ONE-HALF (1/2) TABLET DAILY     OB/GYN:  Estrogens Failed - 03/20/2022  9:23 AM      Failed - Mammogram is up-to-date per Health Maintenance      Failed - Last BP in normal range    BP Readings from Last 1 Encounters:  02/11/22 (!) 145/74         Passed - Valid encounter within last 12 months    Recent Outpatient Visits          1 month ago Benign hypertensive renal disease   [facility] [place], Megan P, DO   7 months ago Simple chronic bronchitis ([place])   [place], [place], DO   11 months ago Benign hypertensive renal disease   [place], [place], DO   1 year ago Benign hypertensive renal disease   [facility] [place], [place], DO   1 year ago Abnormal kidney function   [facility] [place], [place], DO      Future Appointments            In 4 months [place], Barb Merino, DO [facility], [facility]

## 2022-04-30 ENCOUNTER — Other Ambulatory Visit: Payer: Self-pay | Admitting: Family Medicine

## 2022-04-30 NOTE — Telephone Encounter (Signed)
Requested Prescriptions  Pending Prescriptions Disp Refills   fenofibrate micronized (ANTARA) 130 MG capsule [Pharmacy Med Name: FENOFIBRATE CAPS 130MG] 90 capsule 0    Sig: TAKE 1 CAPSULE DAILY BEFORE BREAKFAST     Cardiovascular:  Antilipid - Fibric Acid Derivatives Failed - 04/30/2022 10:45 AM      Failed - Lipid Panel in normal range within the last 12 months    Cholesterol, Total  Date Value Ref Range Status  02/11/2022 158 100 - 199 mg/dL Final   Cholesterol Piccolo, Waived  Date Value Ref Range Status  12/08/2014 153 <200 mg/dL Final    Comment:                            Desirable                <200                         Borderline High      200- 239                         High                     >239    LDL Chol Calc (NIH)  Date Value Ref Range Status  02/11/2022 79 0 - 99 mg/dL Final   HDL  Date Value Ref Range Status  02/11/2022 61 >39 mg/dL Final   Triglycerides  Date Value Ref Range Status  02/11/2022 96 0 - 149 mg/dL Final   Triglycerides Piccolo,Waived  Date Value Ref Range Status  12/08/2014 74 <150 mg/dL Final    Comment:                            Normal                   <150                         Borderline High     150 - 199                         High                200 - 499                         Very High                >499          Passed - ALT in normal range and within 360 days    ALT  Date Value Ref Range Status  02/11/2022 12 0 - 32 IU/L Final         Passed - AST in normal range and within 360 days    AST  Date Value Ref Range Status  02/11/2022 16 0 - 40 IU/L Final         Passed - Cr in normal range and within 360 days    Creatinine, Ser  Date Value Ref Range Status  02/11/2022 0.99 0.57 - 1.00 mg/dL Final         Passed - HGB in normal range and within 360 days    Hemoglobin  Date Value Ref Range Status  02/11/2022 11.1 11.1 - 15.9 g/dL Final         Passed - HCT in normal range and within 360 days     Hematocrit  Date Value Ref Range Status  02/11/2022 34.6 34.0 - 46.6 % Final         Passed - PLT in normal range and within 360 days    Platelets  Date Value Ref Range Status  02/11/2022 178 150 - 450 x10E3/uL Final         Passed - WBC in normal range and within 360 days    WBC  Date Value Ref Range Status  02/11/2022 4.1 3.4 - 10.8 x10E3/uL Final  04/29/2019 3.5 (L) 4.0 - 10.5 K/uL Final         Passed - eGFR is 30 or above and within 360 days    GFR calc Af Amer  Date Value Ref Range Status  07/31/2020 55 (L) >59 mL/min/1.73 Final    Comment:    **In accordance with recommendations from the NKF-ASN Task force,**   [facility] is in the process of updating its eGFR calculation to the   2021 CKD-EPI creatinine equation that estimates kidney function   without a race variable.    GFR calc non Af Amer  Date Value Ref Range Status  07/31/2020 47 (L) >59 mL/min/1.73 Final   eGFR  Date Value Ref Range Status  02/11/2022 55 (L) >59 mL/min/1.73 Final         Passed - Valid encounter within last 12 months    Recent Outpatient Visits           2 months ago Benign hypertensive renal disease   [facility] [place], Megan P, DO   8 months ago Simple chronic bronchitis ([place])   [place], [place], DO   1 year ago Benign hypertensive renal disease   [place], [place], DO   1 year ago Benign hypertensive renal disease   [facility] [place], [place], DO   1 year ago Abnormal kidney function   [facility] [place], [place], DO       Future Appointments             In 3 months [place], Barb Merino, DO [facility], [facility]

## 2022-05-31 ENCOUNTER — Other Ambulatory Visit: Payer: Self-pay | Admitting: Family Medicine

## 2022-06-03 NOTE — Telephone Encounter (Signed)
Requested Prescriptions  Pending Prescriptions Disp Refills   lisinopril (ZESTRIL) 5 MG tablet [Pharmacy Med Name: LISINOPRIL TABS '5MG'$ ] 90 tablet 0    Sig: TAKE 1 TABLET DAILY     Cardiovascular:  ACE Inhibitors Failed - 05/31/2022  5:43 AM      Failed - Last BP in normal range    BP Readings from Last 1 Encounters:  02/11/22 (!) 145/74         Passed - Cr in normal range and within 180 days    Creatinine, Ser  Date Value Ref Range Status  02/11/2022 0.99 0.57 - 1.00 mg/dL Final         Passed - K in normal range and within 180 days    Potassium  Date Value Ref Range Status  02/11/2022 4.2 3.5 - 5.2 mmol/L Final         Passed - Patient is not pregnant      Passed - Valid encounter within last 6 months    Recent Outpatient Visits           3 months ago Benign hypertensive renal disease   [facility] [place], Megan P, DO   9 months ago Simple chronic bronchitis ([place])   [place], Bynum, DO   1 year ago Benign hypertensive renal disease   [place], [place], DO   1 year ago Benign hypertensive renal disease   [facility] [place], [place], DO   1 year ago Abnormal kidney function   [facility] [place], [place], DO       Future Appointments             In 2 months Wynetta Emery, Barb Merino, DO [employer], [facility]

## 2022-07-16 ENCOUNTER — Ambulatory Visit (INDEPENDENT_AMBULATORY_CARE_PROVIDER_SITE_OTHER): Payer: Medicare Other | Admitting: Podiatry

## 2022-07-16 DIAGNOSIS — M778 Other enthesopathies, not elsewhere classified: Secondary | ICD-10-CM

## 2022-07-16 MED ORDER — DEXAMETHASONE SODIUM PHOSPHATE 120 MG/30ML IJ SOLN
2.0000 mg | Freq: Once | INTRAMUSCULAR | Status: AC
  Administered 2022-07-16: 2 mg via INTRA_ARTICULAR

## 2022-07-16 NOTE — Progress Notes (Signed)
She presents today with her daughter Shelton Silvas a chief concern of pain to the first metatarsophalangeal joint of the left foot.  She states that it is sore with most of my shoe gear.  Objective: Vital signs stable tellurian x 3 pulses are still palpable left foot.  Hallux valgus deformities noted bilateral.  She has a an area of erythema with fluctuance beneath the erythema on the medial aspect of the head of the first metatarsal.  Assessment: Hallux valgus with capsulitis bursitis first metatarsophalangeal joint left foot.  Plan: Discussed the conservative therapies versus surgical therapies at this point she like to try an injection to the medial and dorsal medial aspect of the first metatarsophalangeal joint.  We did discuss accommodations that could be made to shoe gear.  I will follow-up with her as needed.

## 2022-08-04 ENCOUNTER — Other Ambulatory Visit: Payer: Self-pay | Admitting: Family Medicine

## 2022-08-05 NOTE — Telephone Encounter (Signed)
Requested Prescriptions  Pending Prescriptions Disp Refills   fluticasone-salmeterol (ADVAIR) 250-50 MCG/ACT AEPB [Pharmacy Med Name: FLUTICASONE/SALMETEROL DISKUS 60'S 250/50] 180 each 3    Sig: USE 1 INHALATION TWICE A DAY     Pulmonology:  Combination Products Passed - 08/04/2022  6:19 AM      Passed - Valid encounter within last 12 months    Recent Outpatient Visits           5 months ago Benign hypertensive renal disease   [facility] [place], Megan P, DO   12 months ago Simple chronic bronchitis ([place])   [place], [place] P, DO   1 year ago Benign hypertensive renal disease   [place], [place], DO   1 year ago Benign hypertensive renal disease   [place], [place] P, DO   1 year ago Abnormal kidney function   [facility] [place], [place], DO       Future Appointments             In 1 week Wynetta Emery, Barb Merino, DO [place], [facility]             fenofibrate micronized (ANTARA) 130 MG capsule [Pharmacy Med Name: FENOFIBRATE CAPS '130MG'$ ] 90 capsule 2    Sig: TAKE 1 CAPSULE DAILY BEFORE BREAKFAST     Cardiovascular:  Antilipid - Fibric Acid Derivatives Failed - 08/04/2022  6:19 AM      Failed - Lipid Panel in normal range within the last 12 months    Cholesterol, Total  Date Value Ref Range Status  02/11/2022 158 100 - 199 mg/dL Final   Cholesterol Piccolo, Waived  Date Value Ref Range Status  12/08/2014 153 <200 mg/dL Final    Comment:                            Desirable                <200                         Borderline High      200- 239                         High                     >239    LDL Chol Calc (NIH)  Date Value Ref Range Status  02/11/2022 79 0 - 99 mg/dL Final   HDL  Date Value Ref Range Status  02/11/2022 61 >39 mg/dL Final   Triglycerides  Date Value Ref Range  Status  02/11/2022 96 0 - 149 mg/dL Final   Triglycerides Piccolo,Waived  Date Value Ref Range Status  12/08/2014 74 <150 mg/dL Final    Comment:                            Normal                   <150                         Borderline High     150 - 199  High                200 - 499                         Very High                >499          Passed - ALT in normal range and within 360 days    ALT  Date Value Ref Range Status  02/11/2022 12 0 - 32 IU/L Final         Passed - AST in normal range and within 360 days    AST  Date Value Ref Range Status  02/11/2022 16 0 - 40 IU/L Final         Passed - Cr in normal range and within 360 days    Creatinine, Ser  Date Value Ref Range Status  02/11/2022 0.99 0.57 - 1.00 mg/dL Final         Passed - HGB in normal range and within 360 days    Hemoglobin  Date Value Ref Range Status  02/11/2022 11.1 11.1 - 15.9 g/dL Final         Passed - HCT in normal range and within 360 days    Hematocrit  Date Value Ref Range Status  02/11/2022 34.6 34.0 - 46.6 % Final         Passed - PLT in normal range and within 360 days    Platelets  Date Value Ref Range Status  02/11/2022 178 150 - 450 x10E3/uL Final         Passed - WBC in normal range and within 360 days    WBC  Date Value Ref Range Status  02/11/2022 4.1 3.4 - 10.8 x10E3/uL Final  04/29/2019 3.5 (L) 4.0 - 10.5 K/uL Final         Passed - eGFR is 30 or above and within 360 days    GFR calc Af Amer  Date Value Ref Range Status  07/31/2020 55 (L) >59 mL/min/1.73 Final    Comment:    **In accordance with recommendations from the NKF-ASN Task force,**   [facility] is in the process of updating its eGFR calculation to the   2021 CKD-EPI creatinine equation that estimates kidney function   without a race variable.    GFR calc non Af Amer  Date Value Ref Range Status  07/31/2020 47 (L) >59 mL/min/1.73 Final   eGFR  Date Value Ref Range  Status  02/11/2022 55 (L) >59 mL/min/1.73 Final         Passed - Valid encounter within last 12 months    Recent Outpatient Visits           5 months ago Benign hypertensive renal disease   [facility] [place], Megan P, DO   12 months ago Simple chronic bronchitis [facility])   [place], Megan P, DO   1 year ago Benign hypertensive renal disease   [place], [place], DO   1 year ago Benign hypertensive renal disease   [place], [place] P, DO   1 year ago Abnormal kidney function   [place], Barb Merino, DO       Future Appointments             In 1 week Wynetta Emery,  [place], [facility]

## 2022-08-13 ENCOUNTER — Ambulatory Visit: Payer: Medicare Other | Admitting: Family Medicine

## 2022-08-19 ENCOUNTER — Telehealth: Payer: Self-pay | Admitting: Family Medicine

## 2022-08-19 NOTE — Telephone Encounter (Signed)
Contacted Karen Velasquez to schedule their annual wellness visit. Patient declined to schedule AWV at this time. Patient refuse to do AWV  Karen Velasquez; [place] Direct Dial: [phone]

## 2022-08-20 ENCOUNTER — Other Ambulatory Visit: Payer: Self-pay | Admitting: Family Medicine

## 2022-08-21 NOTE — Telephone Encounter (Signed)
Appointment 09/02/22 Requested Prescriptions  Pending Prescriptions Disp Refills   lisinopril (ZESTRIL) 5 MG tablet [Pharmacy Med Name: LISINOPRIL TABS '5MG'$ ] 90 tablet 3    Sig: TAKE 1 TABLET DAILY     Cardiovascular:  ACE Inhibitors Failed - 08/20/2022 11:37 AM      Failed - Cr in normal range and within 180 days    Creatinine, Ser  Date Value Ref Range Status  02/11/2022 0.99 0.57 - 1.00 mg/dL Final         Failed - K in normal range and within 180 days    Potassium  Date Value Ref Range Status  02/11/2022 4.2 3.5 - 5.2 mmol/L Final         Failed - Last BP in normal range    BP Readings from Last 1 Encounters:  02/11/22 (!) 145/74         Failed - Valid encounter within last 6 months    Recent Outpatient Visits           6 months ago Benign hypertensive renal disease   [place], Megan P, DO   1 year ago Simple chronic bronchitis [facility])   [place], Megan P, DO   1 year ago Benign hypertensive renal disease   [place] [facility] [place], [place] P, DO   1 year ago Benign hypertensive renal disease   [place], [place] P, DO   1 year ago Abnormal kidney function   [facility] [place], Barb Merino, DO       Future Appointments             In 1 week Valerie Roys, DO [place], [place] - Patient is not pregnant

## 2022-09-02 ENCOUNTER — Encounter: Payer: Self-pay | Admitting: Family Medicine

## 2022-09-02 ENCOUNTER — Ambulatory Visit (INDEPENDENT_AMBULATORY_CARE_PROVIDER_SITE_OTHER): Payer: Medicare Other | Admitting: Family Medicine

## 2022-09-02 VITALS — BP 133/82 | HR 74 | Temp 98.4°F | Wt 131.8 lb

## 2022-09-02 DIAGNOSIS — N1831 Chronic kidney disease, stage 3a: Secondary | ICD-10-CM | POA: Diagnosis not present

## 2022-09-02 DIAGNOSIS — J410 Simple chronic bronchitis: Secondary | ICD-10-CM

## 2022-09-02 DIAGNOSIS — E782 Mixed hyperlipidemia: Secondary | ICD-10-CM

## 2022-09-02 DIAGNOSIS — I129 Hypertensive chronic kidney disease with stage 1 through stage 4 chronic kidney disease, or unspecified chronic kidney disease: Secondary | ICD-10-CM | POA: Diagnosis not present

## 2022-09-02 MED ORDER — EZETIMIBE 10 MG PO TABS: 10.0000 mg | ORAL_TABLET | Freq: Every day | ORAL | 3 refills | Status: DC

## 2022-09-02 MED ORDER — FENOFIBRATE MICRONIZED 130 MG PO CAPS: ORAL_CAPSULE | ORAL | 3 refills | Status: DC

## 2022-09-02 MED ORDER — CETIRIZINE HCL 10 MG PO TABS: 10.0000 mg | ORAL_TABLET | Freq: Every day | ORAL | 3 refills | Status: DC

## 2022-09-02 MED ORDER — FLUTICASONE PROPIONATE 50 MCG/ACT NA SUSP: 2.0000 | Freq: Two times a day (BID) | NASAL | 4 refills | Status: DC

## 2022-09-02 MED ORDER — ESTRADIOL 0.5 MG PO TABS: 0.2500 mg | ORAL_TABLET | Freq: Every day | ORAL | 1 refills | Status: DC

## 2022-09-02 MED ORDER — ESOMEPRAZOLE MAGNESIUM 40 MG PO CPDR: 40.0000 mg | DELAYED_RELEASE_CAPSULE | Freq: Every day | ORAL | 3 refills | Status: DC

## 2022-09-02 MED ORDER — FLUTICASONE-SALMETEROL 250-50 MCG/ACT IN AEPB: 1.0000 | INHALATION_SPRAY | Freq: Two times a day (BID) | RESPIRATORY_TRACT | 3 refills | Status: DC

## 2022-09-02 MED ORDER — LISINOPRIL 5 MG PO TABS: 5.0000 mg | ORAL_TABLET | Freq: Every day | ORAL | 1 refills | Status: DC

## 2022-09-02 NOTE — Assessment & Plan Note (Signed)
Under good control on current regimen. Continue current regimen. Continue to monitor. Call with any concerns. Refills given. Labs drawn today.   

## 2022-09-02 NOTE — Assessment & Plan Note (Signed)
Rechecking labs today. Await results.  

## 2022-09-02 NOTE — Progress Notes (Signed)
BP 133/82   Pulse 74   Temp 98.4 F (36.9 C) (Oral)   Wt 131 lb 12.8 oz (59.8 kg)   LMP 11/28/1971 (Approximate)   SpO2 96%   BMI 25.74 kg/m    Subjective:    Patient ID: Karen Velasquez, female    DOB: 21-Nov-1930, 89 y.o.   MRN: [id number]  HPI: Karen Velasquez is a 89 y.o. female  Chief Complaint  Patient presents with   Hyperlipidemia   Hypertension   HYPERTENSION / [place] Satisfied with current treatment? yes Duration of hypertension: chronic BP monitoring frequency: not checking BP medication side effects: no Past BP meds: lisinopril Duration of hyperlipidemia: chronic Cholesterol medication side effects: no Cholesterol supplements: none Past cholesterol medications: fenofibrate, zetia Medication compliance: excellent compliance Aspirin: no Recent stressors: no Recurrent headaches: no Visual changes: no Palpitations: no Dyspnea: no Chest pain: no Lower extremity edema: no Dizzy/lightheaded: no  Relevant past medical, surgical, family and social history reviewed and updated as indicated. Interim medical history since our last visit reviewed. Allergies and medications reviewed and updated.  Review of Systems  Constitutional:  Positive for unexpected weight change (has not been able to eat due to teeth issue). Negative for activity change, appetite change, chills, diaphoresis, fatigue and fever.  Respiratory: Negative.    Cardiovascular: Negative.   Gastrointestinal: Negative.   Musculoskeletal: Negative.   Neurological: Negative.   Psychiatric/Behavioral: Negative.      Per HPI unless specifically indicated above     Objective:    BP 133/82   Pulse 74   Temp 98.4 F (36.9 C) (Oral)   Wt 131 lb 12.8 oz (59.8 kg)   LMP 11/28/1971 (Approximate)   SpO2 96%   BMI 25.74 kg/m   Wt Readings from Last 3 Encounters:  09/02/22 131 lb 12.8 oz (59.8 kg)  02/11/22 144 lb 12.8 oz (65.7 kg)  08/09/21 146 lb 12.8 oz (66.6 kg)    Physical  Exam Vitals and nursing note reviewed.  Constitutional:      General: She is not in acute distress.    Appearance: Normal appearance. She is not ill-appearing, toxic-appearing or diaphoretic.  HENT:     Head: Normocephalic and atraumatic.     Right Ear: External ear normal.     Left Ear: External ear normal.     Nose: Nose normal.     Mouth/Throat:     Mouth: Mucous membranes are moist.     Pharynx: Oropharynx is clear.  Eyes:     General: No scleral icterus.       Right eye: No discharge.        Left eye: No discharge.     Extraocular Movements: Extraocular movements intact.     Conjunctiva/sclera: Conjunctivae normal.     Pupils: Pupils are equal, round, and reactive to light.  Cardiovascular:     Rate and Rhythm: Normal rate and regular rhythm.     Pulses: Normal pulses.     Heart sounds: Normal heart sounds. No murmur heard.    No friction rub. No gallop.  Pulmonary:     Effort: Pulmonary effort is normal. No respiratory distress.     Breath sounds: Normal breath sounds. No stridor. No wheezing, rhonchi or rales.  Chest:     Chest wall: No tenderness.  Musculoskeletal:        General: Normal range of motion.     Cervical back: Normal range of motion and neck supple.  Skin:    General:  Skin is warm and dry.     Capillary Refill: Capillary refill takes less than 2 seconds.     Coloration: Skin is not jaundiced or pale.     Findings: No bruising, erythema, lesion or rash.  Neurological:     General: No focal deficit present.     Mental Status: She is alert and oriented to person, place, and time. Mental status is at baseline.  Psychiatric:        Mood and Affect: Mood normal.        Behavior: Behavior normal.        Thought Content: Thought content normal.        Judgment: Judgment normal.     Results for orders placed or performed in visit on 02/11/22  Comprehensive metabolic panel  Result Value Ref Range   Glucose 97 70 - 99 mg/dL   BUN 21 8 - 27 mg/dL    Creatinine, Ser 0.99 0.57 - 1.00 mg/dL   eGFR 55 (L) >59 mL/min/1.73   BUN/Creatinine Ratio 21 12 - 28   Sodium 145 (H) 134 - 144 mmol/L   Potassium 4.2 3.5 - 5.2 mmol/L   Chloride 109 (H) 96 - 106 mmol/L   CO2 22 20 - 29 mmol/L   Calcium 9.6 8.7 - 10.3 mg/dL   Total Protein 7.0 6.0 - 8.5 g/dL   Albumin 4.7 3.7 - 4.7 g/dL   Globulin, Total 2.3 1.5 - 4.5 g/dL   Albumin/Globulin Ratio 2.0 1.2 - 2.2   Bilirubin Total 0.4 0.0 - 1.2 mg/dL   Alkaline Phosphatase 42 (L) 44 - 121 IU/L   AST 16 0 - 40 IU/L   ALT 12 0 - 32 IU/L  CBC with Differential/Platelet  Result Value Ref Range   WBC 4.1 3.4 - 10.8 x10E3/uL   RBC 3.75 (L) 3.77 - 5.28 x10E6/uL   Hemoglobin 11.1 11.1 - 15.9 g/dL   Hematocrit 34.6 34.0 - 46.6 %   MCV 92 79 - 97 fL   MCH 29.6 26.6 - 33.0 pg   MCHC 32.1 31.5 - 35.7 g/dL   RDW 13.4 11.7 - 15.4 %   Platelets 178 150 - 450 x10E3/uL   Neutrophils 66 Not Estab. %   Lymphs 24 Not Estab. %   Monocytes 7 Not Estab. %   Eos 2 Not Estab. %   Basos 1 Not Estab. %   Neutrophils Absolute 2.7 1.4 - 7.0 x10E3/uL   Lymphocytes Absolute 1.0 0.7 - 3.1 x10E3/uL   Monocytes Absolute 0.3 0.1 - 0.9 x10E3/uL   EOS (ABSOLUTE) 0.1 0.0 - 0.4 x10E3/uL   Basophils Absolute 0.0 0.0 - 0.2 x10E3/uL   Immature Granulocytes 0 Not Estab. %   Immature Grans (Abs) 0.0 0.0 - 0.1 x10E3/uL  Lipid Panel w/o Chol/HDL Ratio  Result Value Ref Range   Cholesterol, Total 158 100 - 199 mg/dL   Triglycerides 96 0 - 149 mg/dL   HDL 61 >39 mg/dL   VLDL Cholesterol Cal 18 5 - 40 mg/dL   LDL Chol Calc (NIH) 79 0 - 99 mg/dL      Assessment & Plan:   Problem List Items Addressed This Visit       Respiratory   COPD (chronic obstructive pulmonary disease) ([place])    Under good control on current regimen. Continue current regimen. Continue to monitor. Call with any concerns. Refills given. Labs drawn today.        Relevant Medications   cetirizine (ZYRTEC) 10 MG tablet  fluticasone (FLONASE) 50 MCG/ACT  nasal spray   fluticasone-salmeterol (ADVAIR) 250-50 MCG/ACT AEPB     Genitourinary   Benign hypertensive renal disease    Under good control on current regimen. Continue current regimen. Continue to monitor. Call with any concerns. Refills given. Labs drawn today.        Relevant Orders   Microalbumin, Urine Waived   CBC with Differential/Platelet   Comprehensive metabolic panel   Urinalysis, Routine w reflex microscopic   TSH   CKD (chronic kidney disease) stage 3, GFR 30-59 ml/min (HCC)    Rechecking labs today. Await results.      Relevant Orders   CBC with Differential/Platelet   Comprehensive metabolic panel     Other   Hyperlipidemia - Primary    Under good control on current regimen. Continue current regimen. Continue to monitor. Call with any concerns. Refills given. Labs drawn today.       Relevant Medications   ezetimibe (ZETIA) 10 MG tablet   fenofibrate micronized (ANTARA) 130 MG capsule   lisinopril (ZESTRIL) 5 MG tablet   Other Relevant Orders   CBC with Differential/Platelet   Comprehensive metabolic panel   Lipid Panel w/o Chol/HDL Ratio     Follow up plan: Return in about 3 months (around 12/03/2022) for with me, please schedule with Lorrie after 09/12/22.

## 2022-09-03 LAB — CBC WITH DIFFERENTIAL/PLATELET
Basophils Absolute: 0.1 10*3/uL (ref 0.0–0.2)
Basos: 1 %
EOS (ABSOLUTE): 0.1 10*3/uL (ref 0.0–0.4)
Eos: 1 %
Hematocrit: 33.7 % — ABNORMAL LOW (ref 34.0–46.6)
Hemoglobin: 11.1 g/dL (ref 11.1–15.9)
Immature Grans (Abs): 0 10*3/uL (ref 0.0–0.1)
Immature Granulocytes: 0 %
Lymphocytes Absolute: 1.1 10*3/uL (ref 0.7–3.1)
Lymphs: 22 %
MCH: 28.8 pg (ref 26.6–33.0)
MCHC: 32.9 g/dL (ref 31.5–35.7)
MCV: 88 fL (ref 79–97)
Monocytes Absolute: 0.3 10*3/uL (ref 0.1–0.9)
Monocytes: 7 %
Neutrophils Absolute: 3.3 10*3/uL (ref 1.4–7.0)
Neutrophils: 69 %
Platelets: 216 10*3/uL (ref 150–450)
RBC: 3.85 x10E6/uL (ref 3.77–5.28)
RDW: 13.8 % (ref 11.7–15.4)
WBC: 4.9 10*3/uL (ref 3.4–10.8)

## 2022-09-03 LAB — COMPREHENSIVE METABOLIC PANEL
ALT: 14 IU/L (ref 0–32)
AST: 20 IU/L (ref 0–40)
Albumin/Globulin Ratio: 1.9 (ref 1.2–2.2)
Albumin: 4.4 g/dL (ref 3.7–4.7)
Alkaline Phosphatase: 47 IU/L (ref 44–121)
BUN/Creatinine Ratio: 24 (ref 12–28)
BUN: 21 mg/dL (ref 8–27)
Bilirubin Total: 0.4 mg/dL (ref 0.0–1.2)
CO2: 22 mmol/L (ref 20–29)
Calcium: 9.5 mg/dL (ref 8.7–10.3)
Chloride: 105 mmol/L (ref 96–106)
Creatinine, Ser: 0.89 mg/dL (ref 0.57–1.00)
Globulin, Total: 2.3 g/dL (ref 1.5–4.5)
Glucose: 85 mg/dL (ref 70–99)
Potassium: 4.4 mmol/L (ref 3.5–5.2)
Sodium: 143 mmol/L (ref 134–144)
Total Protein: 6.7 g/dL (ref 6.0–8.5)
eGFR: 62 mL/min/{1.73_m2} (ref >59)

## 2022-09-03 LAB — LIPID PANEL W/O CHOL/HDL RATIO
Cholesterol, Total: 149 mg/dL (ref 100–199)
HDL: 67 mg/dL (ref >39)
LDL Chol Calc (NIH): 67 mg/dL (ref 0–99)
Triglycerides: 75 mg/dL (ref 0–149)
VLDL Cholesterol Cal: 15 mg/dL (ref 5–40)

## 2022-09-03 LAB — TSH: TSH: 0.654 u[IU]/mL (ref 0.450–4.500)

## 2022-09-09 ENCOUNTER — Other Ambulatory Visit: Payer: Medicare Other

## 2022-09-09 DIAGNOSIS — I129 Hypertensive chronic kidney disease with stage 1 through stage 4 chronic kidney disease, or unspecified chronic kidney disease: Secondary | ICD-10-CM | POA: Diagnosis not present

## 2022-09-09 LAB — URINALYSIS, ROUTINE W REFLEX MICROSCOPIC
Bilirubin, UA: NEGATIVE
Glucose, UA: NEGATIVE
Ketones, UA: NEGATIVE
Nitrite, UA: NEGATIVE
Protein,UA: NEGATIVE
RBC, UA: NEGATIVE
Specific Gravity, UA: 1.01 (ref 1.005–1.030)
Urobilinogen, Ur: 0.2 mg/dL (ref 0.2–1.0)
pH, UA: 6 (ref 5.0–7.5)

## 2022-09-09 LAB — MICROSCOPIC EXAMINATION: Bacteria, UA: NONE SEEN

## 2022-09-09 LAB — MICROALBUMIN, URINE WAIVED
Creatinine, Urine Waived: 50 mg/dL (ref 10–300)
Microalb, Ur Waived: 10 mg/L (ref 0–19)
Microalb/Creat Ratio: <30 mg/g (ref <30)

## 2022-10-10 DIAGNOSIS — M545 Low back pain, unspecified: Secondary | ICD-10-CM | POA: Diagnosis not present

## 2022-12-03 ENCOUNTER — Ambulatory Visit: Payer: Medicare Other | Admitting: Family Medicine

## 2023-03-10 DIAGNOSIS — H01001 Unspecified blepharitis right upper eyelid: Secondary | ICD-10-CM | POA: Diagnosis not present

## 2023-03-10 DIAGNOSIS — H01004 Unspecified blepharitis left upper eyelid: Secondary | ICD-10-CM | POA: Diagnosis not present

## 2023-03-10 DIAGNOSIS — H2512 Age-related nuclear cataract, left eye: Secondary | ICD-10-CM | POA: Diagnosis not present

## 2023-04-13 DIAGNOSIS — Z23 Encounter for immunization: Secondary | ICD-10-CM | POA: Diagnosis not present

## 2023-05-26 ENCOUNTER — Other Ambulatory Visit: Payer: Self-pay | Admitting: Family Medicine

## 2023-05-26 NOTE — Telephone Encounter (Signed)
Courtesy refill. Patient will need an office visit for further refills. Requested Prescriptions  Pending Prescriptions Disp Refills   lisinopril (ZESTRIL) 5 MG tablet [Pharmacy Med Name: LISINOPRIL TABS 5MG ] 30 tablet 0    Sig: TAKE 1 TABLET DAILY     Cardiovascular:  ACE Inhibitors Failed - 05/26/2023  3:02 PM      Failed - Cr in normal range and within 180 days    Creatinine, Ser  Date Value Ref Range Status  09/02/2022 0.89 0.57 - 1.00 mg/dL Final         Failed - K in normal range and within 180 days    Potassium  Date Value Ref Range Status  09/02/2022 4.4 3.5 - 5.2 mmol/L Final         Failed - Valid encounter within last 6 months    Recent Outpatient Visits           8 months ago Mixed hyperlipidemia   [facility] [place], Megan P, DO   1 year ago Benign hypertensive renal disease   [place] [facility] [place], Megan P, DO   1 year ago Simple chronic bronchitis [facility])   [facility] [place], [place], DO   2 years ago Benign hypertensive renal disease   [facility] [place], [place], DO   2 years ago Benign hypertensive renal disease   [facility] [place], [place], DO              Passed - Patient is not pregnant      Passed - Last BP in normal range    BP Readings from Last 1 Encounters:  09/02/22 133/82

## 2023-05-26 NOTE — Telephone Encounter (Signed)
Called pt  and spoke with daughter. She will call back to schedule appt.

## 2023-06-22 ENCOUNTER — Encounter: Payer: Self-pay | Admitting: Family Medicine

## 2023-06-22 ENCOUNTER — Ambulatory Visit (INDEPENDENT_AMBULATORY_CARE_PROVIDER_SITE_OTHER): Payer: Medicare Other | Admitting: Family Medicine

## 2023-06-22 VITALS — BP 120/73 | HR 77 | Wt 131.0 lb

## 2023-06-22 DIAGNOSIS — K219 Gastro-esophageal reflux disease without esophagitis: Secondary | ICD-10-CM

## 2023-06-22 DIAGNOSIS — I129 Hypertensive chronic kidney disease with stage 1 through stage 4 chronic kidney disease, or unspecified chronic kidney disease: Secondary | ICD-10-CM

## 2023-06-22 DIAGNOSIS — N1831 Chronic kidney disease, stage 3a: Secondary | ICD-10-CM

## 2023-06-22 DIAGNOSIS — Z87891 Personal history of nicotine dependence: Secondary | ICD-10-CM | POA: Diagnosis not present

## 2023-06-22 DIAGNOSIS — D692 Other nonthrombocytopenic purpura: Secondary | ICD-10-CM | POA: Diagnosis not present

## 2023-06-22 DIAGNOSIS — F419 Anxiety disorder, unspecified: Secondary | ICD-10-CM | POA: Insufficient documentation

## 2023-06-22 DIAGNOSIS — Z0000 Encounter for general adult medical examination without abnormal findings: Secondary | ICD-10-CM

## 2023-06-22 DIAGNOSIS — N183 Chronic kidney disease, stage 3 unspecified: Secondary | ICD-10-CM | POA: Diagnosis not present

## 2023-06-22 DIAGNOSIS — E785 Hyperlipidemia, unspecified: Secondary | ICD-10-CM | POA: Diagnosis not present

## 2023-06-22 DIAGNOSIS — J449 Chronic obstructive pulmonary disease, unspecified: Secondary | ICD-10-CM | POA: Diagnosis not present

## 2023-06-22 DIAGNOSIS — E782 Mixed hyperlipidemia: Secondary | ICD-10-CM | POA: Diagnosis not present

## 2023-06-22 DIAGNOSIS — J410 Simple chronic bronchitis: Secondary | ICD-10-CM

## 2023-06-22 LAB — MICROALBUMIN, URINE WAIVED
Creatinine, Urine Waived: 200 mg/dL (ref 10–300)
Microalb, Ur Waived: 30 mg/L — ABNORMAL HIGH (ref 0–19)
Microalb/Creat Ratio: <30 mg/g (ref <30)

## 2023-06-22 MED ORDER — FENOFIBRATE MICRONIZED 130 MG PO CAPS: ORAL_CAPSULE | ORAL | 1 refills | Status: DC

## 2023-06-22 MED ORDER — LISINOPRIL 5 MG PO TABS: 5.0000 mg | ORAL_TABLET | Freq: Every day | ORAL | 1 refills | Status: DC

## 2023-06-22 MED ORDER — EZETIMIBE 10 MG PO TABS: 10.0000 mg | ORAL_TABLET | Freq: Every day | ORAL | 1 refills | Status: DC

## 2023-06-22 MED ORDER — FLUTICASONE PROPIONATE 50 MCG/ACT NA SUSP: 2.0000 | Freq: Two times a day (BID) | NASAL | 4 refills | Status: DC

## 2023-06-22 MED ORDER — MIRTAZAPINE 15 MG PO TABS
15.0000 mg | ORAL_TABLET | Freq: Every day | ORAL | 0 refills | Status: DC
Start: 2023-06-22 — End: 2023-08-06

## 2023-06-22 MED ORDER — ESOMEPRAZOLE MAGNESIUM 40 MG PO CPDR: 40.0000 mg | DELAYED_RELEASE_CAPSULE | Freq: Every day | ORAL | 3 refills | Status: DC

## 2023-06-22 MED ORDER — FLUTICASONE-SALMETEROL 250-50 MCG/ACT IN AEPB: 1.0000 | INHALATION_SPRAY | Freq: Two times a day (BID) | RESPIRATORY_TRACT | 3 refills | Status: AC

## 2023-06-22 NOTE — Patient Instructions (Signed)
 Preventative Services:  Health Risk Assessment and Personalized Prevention Plan: Done today Bone Mass Measurements: up to date Breast Cancer Screening: N/A CVD Screening: Done today Cervical Cancer Screening: N/A Colon Cancer Screening: N/A Depression Screening: Done today Diabetes Screening: Done today Glaucoma Screening: See your eye doctor Hepatitis B vaccine: N/A Hepatitis C screening: up to date HIV Screening: up to date Flu Vaccine: up to date Lung cancer Screening: N/A Obesity Screening: done today Pneumonia Vaccines (2): up to date STI Screening: N/A

## 2023-06-22 NOTE — Assessment & Plan Note (Signed)
 Will start her on remeron to help with sleep and stress. Recheck in about 6 weeks. Call with any concerns.

## 2023-06-22 NOTE — Assessment & Plan Note (Signed)
 Rechecking labs today. Await results.

## 2023-06-22 NOTE — Assessment & Plan Note (Signed)
 Under good control on current regimen. Continue current regimen. Continue to monitor. Call with any concerns. Refills given. Labs drawn today.

## 2023-06-22 NOTE — Assessment & Plan Note (Signed)
 Reassured patient. Continue to monitor.

## 2023-06-22 NOTE — Progress Notes (Signed)
 BP 120/73   Pulse 77   Wt 131 lb (59.4 kg)   LMP 11/28/1971 (Approximate)   SpO2 99%   BMI 25.58 kg/m    Subjective:    Patient ID: Karen Velasquez, female    DOB: 01/30/1931, 89 y.o.   MRN: [id number]  HPI: Karen Velasquez is a 89 y.o. female presenting on 06/22/2023 for comprehensive medical examination. Current medical complaints include:  HYPERTENSION / HYPERLIPIDEMIA Satisfied with current treatment? yes Duration of hypertension: chronic BP monitoring frequency: not checking BP medication side effects: no Past BP meds: lisinopril  Duration of hyperlipidemia: chronic Cholesterol medication side effects: no Cholesterol supplements: none Past cholesterol medications: zetia , fenofibrate  Medication compliance: excellent compliance Aspirin: no Recent stressors: no Recurrent headaches: no Visual changes: no Palpitations: no Dyspnea: no Chest pain: no Lower extremity edema: no Dizzy/lightheaded: no  GERD GERD control status: controlled Satisfied with current treatment? yes Medication side effects: no  Medication compliance: stable Dysphagia: no Odynophagia:  no Hematemesis: no Blood in stool: no EGD: no  ANXIETY/STRESS Duration: chronic Status:uncontrolled Anxious mood: yes  Excessive worrying: yes Irritability: no  Sweating: no Nausea: no Palpitations:no Hyperventilation: no Panic attacks: no Agoraphobia: no  Obscessions/compulsions: no Depressed mood: yes    06/22/2023    2:51 PM 06/22/2023    2:32 PM 09/02/2022    1:43 PM 02/11/2022    8:57 AM 09/11/2021   11:35 AM  Depression screen PHQ 2/9  Decreased Interest 2 3 0 0 0  Down, Depressed, Hopeless 2 3 0 0 0  PHQ - 2 Score 4 6 0 0 0  Altered sleeping 3 3 0 3   Tired, decreased energy 1 3 1 3    Change in appetite 0 0 0 0   Feeling bad or failure about yourself  0 0 0 0   Trouble concentrating 3 0 0 0   Moving slowly or fidgety/restless 0 0 0 0   Suicidal thoughts 0 0 0 0   PHQ-9 Score 11 12 1 6     Difficult doing work/chores Somewhat difficult  Not difficult at all        06/22/2023    2:33 PM 09/02/2022    1:43 PM 02/11/2022    8:57 AM  GAD 7 : Generalized Anxiety Score  Nervous, Anxious, on Edge 3 0 0  Control/stop worrying 3 0 3  Worry too much - different things 3 1 3   Trouble relaxing  0   Restless 0 0 0  Easily annoyed or irritable 3 1 3   Afraid - awful might happen 3 0 2  Total GAD 7 Score  2   Anxiety Difficulty Somewhat difficult Somewhat difficult    Anhedonia: no Weight changes: no Insomnia: yes hard to fall asleep  Hypersomnia: yes Fatigue/loss of energy: yes Feelings of worthlessness: no Feelings of guilt: no Impaired concentration/indecisiveness: no Suicidal ideations: no  Crying spells: no Recent Stressors/Life Changes: no   Relationship problems: no   Family stress: no     Financial stress: no    Job stress: no    Recent death/loss: no    She currently lives with: alone Menopausal Symptoms: no  Functional Status Survey: Is the patient deaf or have difficulty hearing?: Yes Does the patient have difficulty seeing, even when wearing glasses/contacts?: Yes Does the patient have difficulty concentrating, remembering, or making decisions?: Yes Does the patient have difficulty walking or climbing stairs?: Yes Does the patient have difficulty dressing or bathing?: No Does the patient have difficulty  doing errands alone such as visiting a doctor's office or shopping?: Yes     06/22/2023    2:53 PM 06/22/2023    2:32 PM 09/02/2022    1:42 PM 09/11/2021   11:28 AM 08/09/2021    8:08 AM  Fall Risk   Falls in the past year? 0  0 0 0  Number falls in past yr: 0 0 0 0 0  Injury with Fall? 0 0 0 0 0  Risk for fall due to : Impaired balance/gait No Fall Risks No Fall Risks  No Fall Risks  Follow up Falls evaluation completed Falls evaluation completed Falls evaluation completed Falls evaluation completed;Education provided;Falls prevention discussed Falls  evaluation completed    Depression Screen    06/22/2023    2:51 PM 06/22/2023    2:32 PM 09/02/2022    1:43 PM 02/11/2022    8:57 AM 09/11/2021   11:35 AM  Depression screen PHQ 2/9  Decreased Interest 2 3 0 0 0  Down, Depressed, Hopeless 2 3 0 0 0  PHQ - 2 Score 4 6 0 0 0  Altered sleeping 3 3 0 3   Tired, decreased energy 1 3 1 3    Change in appetite 0 0 0 0   Feeling bad or failure about yourself  0 0 0 0   Trouble concentrating 3 0 0 0   Moving slowly or fidgety/restless 0 0 0 0   Suicidal thoughts 0 0 0 0   PHQ-9 Score 11 12 1 6    Difficult doing work/chores Somewhat difficult  Not difficult at all       Advanced Directives Does patient have a HCPOA?    yes If yes, name and contact information:  Does patient have a living will or MOST form?  yes  Past Medical History:  Past Medical History:  Diagnosis Date   COPD (chronic obstructive pulmonary disease) (HCC)    Hyperlipidemia    Hypertension     Surgical History:  Past Surgical History:  Procedure Laterality Date   ABDOMINAL HYSTERECTOMY     APPENDECTOMY     2105   cornea implant     3,4 yrs ago   MULTIPLE TOOTH EXTRACTIONS     PARTIAL HYSTERECTOMY     89yrs of age   2 CUFF REPAIR     31yr ago   TONSILLECTOMY     as a child   X-STOP IMPLANTATION N/A    62yrs ago    Medications:  Current Outpatient Medications on File Prior to Visit  Medication Sig   Calcium Carbonate-Vitamin D  600-400 MG-UNIT per tablet Take 1 tablet by mouth daily.    diclofenac  Sodium (VOLTAREN ) 1 % GEL    estradiol  (ESTRACE ) 0.5 MG tablet Take 0.5 tablets (0.25 mg total) by mouth daily.   EXTRA STRENGTH ACETAMINOPHEN PO Take 1 tablet by mouth every 6 (six) hours as needed.   meclizine  (ANTIVERT ) 25 MG tablet Take 1 tablet (25 mg total) by mouth 3 (three) times daily as needed for dizziness.   prednisoLONE acetate (PRED FORTE) 1 % ophthalmic suspension SMARTSIG:In Eye(s)   No current facility-administered medications on file  prior to visit.    Allergies:  No Known Allergies  Social History:  Social History   Socioeconomic History   Marital status: Widowed    Spouse name: Not on file   Number of children: 1   Years of education: Not on file   Highest education level: Not on file  Occupational  History   Occupation: retired  Tobacco Use   Smoking status: Former    Current packs/day: 0.00    Types: Cigarettes    Quit date: 08/25/2016    Years since quitting: 6.8   Smokeless tobacco: Never  Vaping Use   Vaping status: Never Used  Substance and Sexual Activity   Alcohol use: No    Alcohol/week: 0.0 standard drinks of alcohol   Drug use: No   Sexual activity: Not Currently  Other Topics Concern   Not on file  Social History Narrative   Not on file   Social Drivers of Health   Financial Resource Strain: Low Risk  (09/11/2021)   Overall Financial Resource Strain (CARDIA)    Difficulty of Paying Living Expenses: Not hard at all  Food Insecurity: No Food Insecurity (06/22/2023)   Hunger Vital Sign    Worried About Running Out of Food in the Last Year: Never true    Ran Out of Food in the Last Year: Never true  Transportation Needs: No Transportation Needs (06/22/2023)   PRAPARE - Administrator, Civil Service (Medical): No    Lack of Transportation (Non-Medical): No  Physical Activity: Inactive (09/11/2021)   Exercise Vital Sign    Days of Exercise per Week: 0 days    Minutes of Exercise per Session: 0 min  Stress: No Stress Concern Present (09/11/2021)   [employer] of Occupational Health - Occupational Stress Questionnaire    Feeling of Stress : Not at all  Social Connections: Moderately Integrated (09/11/2021)   Social Connection and Isolation Panel [NHANES]    Frequency of Communication with Friends and Family: More than three times a week    Frequency of Social Gatherings with Friends and Family: Twice a week    Attends Religious Services: More than 4 times per year    Active  Member of [employer] or Organizations: Yes    Attends Banker Meetings: More than 4 times per year    Marital Status: Widowed  Intimate Partner Violence: Not At Risk (06/22/2023)   Humiliation, Afraid, Rape, and Kick questionnaire    Fear of Current or Ex-Partner: No    Emotionally Abused: No    Physically Abused: No    Sexually Abused: No   Social History   Tobacco Use  Smoking Status Former   Current packs/day: 0.00   Types: Cigarettes   Quit date: 08/25/2016   Years since quitting: 6.8  Smokeless Tobacco Never   Social History   Substance and Sexual Activity  Alcohol Use No   Alcohol/week: 0.0 standard drinks of alcohol    Family History:  Family History  Problem Relation Age of Onset   Diabetes Father    Cancer Father    Cancer Sister     Past medical history, surgical history, medications, allergies, family history and social history reviewed with patient today and changes made to appropriate areas of the chart.   Review of Systems  Constitutional: Negative.  Negative for chills, diaphoresis, fever, malaise/fatigue and weight loss.  HENT:  Positive for ear pain. Negative for congestion, ear discharge, hearing loss, nosebleeds, sinus pain, sore throat and tinnitus.   Eyes:  Positive for blurred vision. Negative for double vision, photophobia, pain, discharge and redness.  Respiratory: Negative.  Negative for stridor.   Cardiovascular: Negative.   Gastrointestinal: Negative.   Genitourinary: Negative.   Musculoskeletal: Negative.   Skin: Negative.   Neurological: Negative.   Endo/Heme/Allergies:  Positive for environmental allergies. Negative  for polydipsia. Bruises/bleeds easily.  Psychiatric/Behavioral:  Negative for depression, hallucinations, memory loss, substance abuse and suicidal ideas. The patient has insomnia. The patient is not nervous/anxious.     All other ROS negative except what is listed above and in the HPI.      Objective:    BP  120/73   Pulse 77   Wt 131 lb (59.4 kg)   LMP 11/28/1971 (Approximate)   SpO2 99%   BMI 25.58 kg/m   Wt Readings from Last 3 Encounters:  06/22/23 131 lb (59.4 kg)  09/02/22 131 lb 12.8 oz (59.8 kg)  02/11/22 144 lb 12.8 oz (65.7 kg)   Physical Exam Vitals and nursing note reviewed.  Constitutional:      General: She is not in acute distress.    Appearance: Normal appearance. She is not ill-appearing, toxic-appearing or diaphoretic.  HENT:     Head: Normocephalic and atraumatic.     Right Ear: Tympanic membrane, ear canal and external ear normal. There is no impacted cerumen.     Left Ear: Tympanic membrane, ear canal and external ear normal. There is no impacted cerumen.     Nose: Nose normal. No congestion or rhinorrhea.     Mouth/Throat:     Mouth: Mucous membranes are moist.     Pharynx: Oropharynx is clear. No oropharyngeal exudate or posterior oropharyngeal erythema.  Eyes:     General: No scleral icterus.       Right eye: No discharge.        Left eye: No discharge.     Extraocular Movements: Extraocular movements intact.     Conjunctiva/sclera: Conjunctivae normal.     Pupils: Pupils are equal, round, and reactive to light.  Neck:     Vascular: No carotid bruit.  Cardiovascular:     Rate and Rhythm: Normal rate and regular rhythm.     Pulses: Normal pulses.     Heart sounds: No murmur heard.    No friction rub. No gallop.  Pulmonary:     Effort: Pulmonary effort is normal. No respiratory distress.     Breath sounds: Normal breath sounds. No stridor. No wheezing, rhonchi or rales.  Chest:     Chest wall: No tenderness.  Abdominal:     General: Abdomen is flat. Bowel sounds are normal. There is no distension.     Palpations: Abdomen is soft. There is no mass.     Tenderness: There is no abdominal tenderness. There is no right CVA tenderness, left CVA tenderness, guarding or rebound.     Hernia: No hernia is present.  Genitourinary:    Comments: Breast and  pelvic exams deferred with shared decision making Musculoskeletal:        General: No swelling, tenderness, deformity or signs of injury.     Cervical back: Normal range of motion and neck supple. No rigidity. No muscular tenderness.     Right lower leg: No edema.     Left lower leg: No edema.  Lymphadenopathy:     Cervical: No cervical adenopathy.  Skin:    General: Skin is warm and dry.     Capillary Refill: Capillary refill takes less than 2 seconds.     Coloration: Skin is not jaundiced or pale.     Findings: No bruising, erythema, lesion or rash.  Neurological:     General: No focal deficit present.     Mental Status: She is alert and oriented to person, place, and time. Mental status is at  baseline.     Cranial Nerves: No cranial nerve deficit.     Sensory: No sensory deficit.     Motor: No weakness.     Coordination: Coordination normal.     Gait: Gait normal.     Deep Tendon Reflexes: Reflexes normal.  Psychiatric:        Mood and Affect: Mood normal.        Behavior: Behavior normal.        Thought Content: Thought content normal.        Judgment: Judgment normal.        06/22/2023    3:00 PM 09/11/2021   11:28 AM 07/31/2020    8:55 AM 03/28/2019    8:32 AM 03/22/2018    8:26 AM  6CIT Screen  What Year? 0 points 0 points 0 points 0 points 0 points  What month? 0 points 0 points 0 points 0 points 0 points  What time? 0 points 0 points 0 points 0 points 0 points  Count back from 20 0 points 0 points 0 points 0 points 0 points  Months in reverse 0 points 2 points 0 points 0 points 0 points  Repeat phrase 2 points 0 points 0 points 0 points 0 points  Total Score 2 points 2 points 0 points 0 points 0 points     Results for orders placed or performed in visit on 09/02/22  CBC with Differential/Platelet   Collection Time: 09/02/22  1:57 PM  Result Value Ref Range   WBC 4.9 3.4 - 10.8 x10E3/uL   RBC 3.85 3.77 - 5.28 x10E6/uL   Hemoglobin 11.1 11.1 - 15.9 g/dL    Hematocrit 66.2 (L) 34.0 - 46.6 %   MCV 88 79 - 97 fL   MCH 28.8 26.6 - 33.0 pg   MCHC 32.9 31.5 - 35.7 g/dL   RDW 86.1 88.2 - 84.5 %   Platelets 216 150 - 450 x10E3/uL   Neutrophils 69 Not Estab. %   Lymphs 22 Not Estab. %   Monocytes 7 Not Estab. %   Eos 1 Not Estab. %   Basos 1 Not Estab. %   Neutrophils Absolute 3.3 1.4 - 7.0 x10E3/uL   Lymphocytes Absolute 1.1 0.7 - 3.1 x10E3/uL   Monocytes Absolute 0.3 0.1 - 0.9 x10E3/uL   EOS (ABSOLUTE) 0.1 0.0 - 0.4 x10E3/uL   Basophils Absolute 0.1 0.0 - 0.2 x10E3/uL   Immature Granulocytes 0 Not Estab. %   Immature Grans (Abs) 0.0 0.0 - 0.1 x10E3/uL  Comprehensive metabolic panel   Collection Time: 09/02/22  1:57 PM  Result Value Ref Range   Glucose 85 70 - 99 mg/dL   BUN 21 8 - 27 mg/dL   Creatinine, Ser 9.10 0.57 - 1.00 mg/dL   eGFR 62 >40 fO/fpw/8.26   BUN/Creatinine Ratio 24 12 - 28   Sodium 143 134 - 144 mmol/L   Potassium 4.4 3.5 - 5.2 mmol/L   Chloride 105 96 - 106 mmol/L   CO2 22 20 - 29 mmol/L   Calcium 9.5 8.7 - 10.3 mg/dL   Total Protein 6.7 6.0 - 8.5 g/dL   Albumin 4.4 3.7 - 4.7 g/dL   Globulin, Total 2.3 1.5 - 4.5 g/dL   Albumin/Globulin Ratio 1.9 1.2 - 2.2   Bilirubin Total 0.4 0.0 - 1.2 mg/dL   Alkaline Phosphatase 47 44 - 121 IU/L   AST 20 0 - 40 IU/L   ALT 14 0 - 32 IU/L  Lipid Panel w/o Chol/HDL  Ratio   Collection Time: 09/02/22  1:57 PM  Result Value Ref Range   Cholesterol, Total 149 100 - 199 mg/dL   Triglycerides 75 0 - 149 mg/dL   HDL 67 >60 mg/dL   VLDL Cholesterol Cal 15 5 - 40 mg/dL   LDL Chol Calc (NIH) 67 0 - 99 mg/dL  TSH   Collection Time: 09/02/22  1:57 PM  Result Value Ref Range   TSH 0.654 0.450 - 4.500 uIU/mL  Microscopic Examination   Collection Time: 09/09/22 10:05 AM   Urine  Result Value Ref Range   WBC, UA 0-5 0 - 5 /hpf   RBC, Urine 0-2 0 - 2 /hpf   Epithelial Cells (non renal) 0-10 0 - 10 /hpf   Bacteria, UA None seen None seen/Few  Microalbumin, Urine Waived   Collection  Time: 09/09/22 10:05 AM  Result Value Ref Range   Microalb, Ur Waived 10 0 - 19 mg/L   Creatinine, Urine Waived 50 10 - 300 mg/dL   Microalb/Creat Ratio <30 <30 mg/g  Urinalysis, Routine w reflex microscopic   Collection Time: 09/09/22 10:05 AM  Result Value Ref Range   Specific Gravity, UA 1.010 1.005 - 1.030   pH, UA 6.0 5.0 - 7.5   Color, UA Yellow Yellow   Appearance Ur Clear Clear   Leukocytes,UA 1+ (A) Negative   Protein,UA Negative Negative/Trace   Glucose, UA Negative Negative   Ketones, UA Negative Negative   RBC, UA Negative Negative   Bilirubin, UA Negative Negative   Urobilinogen, Ur 0.2 0.2 - 1.0 mg/dL   Nitrite, UA Negative Negative   Microscopic Examination See below:       Assessment & Plan:   Problem List Items Addressed This Visit       Cardiovascular and Mediastinum   Senile purpura (HCC)   Reassured patient. Continue to monitor.       Relevant Medications   ezetimibe  (ZETIA ) 10 MG tablet   fenofibrate  micronized (ANTARA ) 130 MG capsule   lisinopril  (ZESTRIL ) 5 MG tablet     Respiratory   COPD (chronic obstructive pulmonary disease) (HCC)   Under good control on current regimen. Continue current regimen. Continue to monitor. Call with any concerns. Refills given. Labs drawn today.      Relevant Medications   fluticasone  (FLONASE ) 50 MCG/ACT nasal spray   fluticasone -salmeterol (ADVAIR) 250-50 MCG/ACT AEPB     Digestive   GERD (gastroesophageal reflux disease)   Under good control on current regimen. Continue current regimen. Continue to monitor. Call with any concerns. Refills given. Labs drawn today.      Relevant Medications   esomeprazole  (NEXIUM ) 40 MG capsule   Other Relevant Orders   CBC with Differential/Platelet   Comprehensive metabolic panel     Genitourinary   Benign hypertensive renal disease   Under good control on current regimen. Continue current regimen. Continue to monitor. Call with any concerns. Refills given. Labs  drawn today.       Relevant Orders   CBC with Differential/Platelet   Comprehensive metabolic panel   TSH   Microalbumin, Urine Waived   CKD (chronic kidney disease) stage 3, GFR 30-59 ml/min (HCC)   Rechecking labs today. Await results.       Relevant Orders   CBC with Differential/Platelet   Comprehensive metabolic panel     Other   Hyperlipidemia   Under good control on current regimen. Continue current regimen. Continue to monitor. Call with any concerns. Refills given. Labs drawn  today.      Relevant Medications   ezetimibe  (ZETIA ) 10 MG tablet   fenofibrate  micronized (ANTARA ) 130 MG capsule   lisinopril  (ZESTRIL ) 5 MG tablet   Other Relevant Orders   CBC with Differential/Platelet   Comprehensive metabolic panel   Lipid Panel w/o Chol/HDL Ratio   Anxiety   Will start her on remeron  to help with sleep and stress. Recheck in about 6 weeks. Call with any concerns.       Relevant Medications   mirtazapine  (REMERON ) 15 MG tablet   Other Visit Diagnoses       Encounter for Medicare annual wellness exam    -  Primary   Preventative care discussed today as below.        Preventative Services:  Health Risk Assessment and Personalized Prevention Plan: Done today Bone Mass Measurements: up to date Breast Cancer Screening: N/A CVD Screening: Done today Cervical Cancer Screening: N/A Colon Cancer Screening: N/A Depression Screening: Done today Diabetes Screening: Done today Glaucoma Screening: See your eye doctor Hepatitis B vaccine: N/A Hepatitis C screening: up to date HIV Screening: up to date Flu Vaccine: up to date Lung cancer Screening: N/A Obesity Screening: done today Pneumonia Vaccines (2): up to date STI Screening: N/A  Follow up plan: Return in about 6 weeks (around 08/03/2023).   LABORATORY TESTING:  - Pap smear: not applicable  IMMUNIZATIONS:   - Tdap: Tetanus vaccination status reviewed: last tetanus booster within 10 years. - Influenza:  Up to date - Pneumovax: Up to date - Prevnar: Up to date - Zostavax vaccine: Refused  SCREENING: -Mammogram: Not applicable  - Colonoscopy: Not applicable  - Bone Density: Up to date   PATIENT COUNSELING:   Advised to take 1 mg of folate supplement per day if capable of pregnancy.   Sexuality: Discussed sexually transmitted diseases, partner selection, use of condoms, avoidance of unintended pregnancy  and contraceptive alternatives.   Advised to avoid cigarette smoking.  I discussed with the patient that most people either abstain from alcohol or drink within safe limits (<=14/week and <=4 drinks/occasion for males, <=7/weeks and <= 3 drinks/occasion for females) and that the risk for alcohol disorders and other health effects rises proportionally with the number of drinks per week and how often a drinker exceeds daily limits.  Discussed cessation/primary prevention of drug use and availability of treatment for abuse.   Diet: Encouraged to adjust caloric intake to maintain  or achieve ideal body weight, to reduce intake of dietary saturated fat and total fat, to limit sodium intake by avoiding high sodium foods and not adding table salt, and to maintain adequate dietary potassium and calcium preferably from fresh fruits, vegetables, and low-fat dairy products.    stressed the importance of regular exercise  Injury prevention: Discussed safety belts, safety helmets, smoke detector, smoking near bedding or upholstery.   Dental health: Discussed importance of regular tooth brushing, flossing, and dental visits.    NEXT PREVENTATIVE PHYSICAL DUE IN 1 YEAR. Return in about 6 weeks (around 08/03/2023).

## 2023-06-23 LAB — CBC WITH DIFFERENTIAL/PLATELET
Basophils Absolute: 0 10*3/uL (ref 0.0–0.2)
Basos: 1 %
EOS (ABSOLUTE): 0 10*3/uL (ref 0.0–0.4)
Eos: 1 %
Hematocrit: 32.8 % — ABNORMAL LOW (ref 34.0–46.6)
Hemoglobin: 11 g/dL — ABNORMAL LOW (ref 11.1–15.9)
Immature Grans (Abs): 0 10*3/uL (ref 0.0–0.1)
Immature Granulocytes: 0 %
Lymphocytes Absolute: 0.9 10*3/uL (ref 0.7–3.1)
Lymphs: 26 %
MCH: 30.3 pg (ref 26.6–33.0)
MCHC: 33.5 g/dL (ref 31.5–35.7)
MCV: 90 fL (ref 79–97)
Monocytes Absolute: 0.3 10*3/uL (ref 0.1–0.9)
Monocytes: 9 %
Neutrophils Absolute: 2.2 10*3/uL (ref 1.4–7.0)
Neutrophils: 63 %
Platelets: 191 10*3/uL (ref 150–450)
RBC: 3.63 x10E6/uL — ABNORMAL LOW (ref 3.77–5.28)
RDW: 12.9 % (ref 11.7–15.4)
WBC: 3.4 10*3/uL (ref 3.4–10.8)

## 2023-06-23 LAB — COMPREHENSIVE METABOLIC PANEL
ALT: 14 [IU]/L (ref 0–32)
AST: 19 [IU]/L (ref 0–40)
Albumin: 4.6 g/dL (ref 3.6–4.6)
Alkaline Phosphatase: 38 [IU]/L — ABNORMAL LOW (ref 44–121)
BUN/Creatinine Ratio: 23 (ref 12–28)
BUN: 21 mg/dL (ref 10–36)
Bilirubin Total: 0.4 mg/dL (ref 0.0–1.2)
CO2: 21 mmol/L (ref 20–29)
Calcium: 10 mg/dL (ref 8.7–10.3)
Chloride: 105 mmol/L (ref 96–106)
Creatinine, Ser: 0.91 mg/dL (ref 0.57–1.00)
Globulin, Total: 1.9 g/dL (ref 1.5–4.5)
Glucose: 101 mg/dL — ABNORMAL HIGH (ref 70–99)
Potassium: 4.4 mmol/L (ref 3.5–5.2)
Sodium: 143 mmol/L (ref 134–144)
Total Protein: 6.5 g/dL (ref 6.0–8.5)
eGFR: 60 mL/min/{1.73_m2} (ref >59)

## 2023-06-23 LAB — LIPID PANEL W/O CHOL/HDL RATIO
Cholesterol, Total: 145 mg/dL (ref 100–199)
HDL: 65 mg/dL (ref >39)
LDL Chol Calc (NIH): 69 mg/dL (ref 0–99)
Triglycerides: 52 mg/dL (ref 0–149)
VLDL Cholesterol Cal: 11 mg/dL (ref 5–40)

## 2023-06-23 LAB — TSH: TSH: 0.626 u[IU]/mL (ref 0.450–4.500)

## 2023-06-28 ENCOUNTER — Encounter: Payer: Self-pay | Admitting: Family Medicine

## 2023-08-06 ENCOUNTER — Encounter: Payer: Self-pay | Admitting: Family Medicine

## 2023-08-06 ENCOUNTER — Ambulatory Visit: Payer: Medicare Other | Admitting: Family Medicine

## 2023-08-06 VITALS — BP 135/64 | HR 92 | Temp 98.4°F | Resp 15 | Ht 59.45 in | Wt 131.0 lb

## 2023-08-06 DIAGNOSIS — G8929 Other chronic pain: Secondary | ICD-10-CM

## 2023-08-06 DIAGNOSIS — M545 Low back pain, unspecified: Secondary | ICD-10-CM

## 2023-08-06 DIAGNOSIS — F419 Anxiety disorder, unspecified: Secondary | ICD-10-CM

## 2023-08-06 MED ORDER — MIRTAZAPINE 15 MG PO TABS: 7.5000 mg | ORAL_TABLET | Freq: Every day | ORAL | Status: DC

## 2023-08-06 NOTE — Progress Notes (Signed)
 BP 135/64 (BP Location: Left Arm, Patient Position: Sitting, Cuff Size: Normal)   Pulse 92   Temp 98.4 F (36.9 C) (Oral)   Resp 15   Ht 4' 11.45" (1.51 m)   Wt 131 lb (59.4 kg)   LMP 11/28/1971 (Approximate)   SpO2 98%   BMI 26.06 kg/m    Subjective:    Patient ID: Karen Velasquez, female    DOB: [birth year], 89 y.o.   MRN: [id number]  HPI: Karen Velasquez is a 89 y.o. female  Chief Complaint  Patient presents with   Anxiety    Feels good.    Knots    Knots on her back started just after she was here last   ANXIETY/INSOMNIA- remeron made her feel weird, but she couldn't tell why she didn't feel right. She stopped it, but has been feeling well Duration: chronic status:stable Anxious mood: no  Excessive worrying: no Irritability: no  Sweating: no Nausea: no Palpitations:no Hyperventilation: no Panic attacks: no Agoraphobia: no  Obscessions/compulsions: no Depressed mood: no    08/06/2023    1:38 PM 06/22/2023    2:51 PM 06/22/2023    2:32 PM 09/02/2022    1:43 PM 02/11/2022    8:57 AM  Depression screen PHQ 2/9  Decreased Interest 2 2 3  0 0  Down, Depressed, Hopeless 0 2 3 0 0  PHQ - 2 Score 2 4 6  0 0  Altered sleeping 2 3 3  0 3  Tired, decreased energy 2 1 3 1 3   Change in appetite 0 0 0 0 0  Feeling bad or failure about yourself  0 0 0 0 0  Trouble concentrating 0 3 0 0 0  Moving slowly or fidgety/restless 0 0 0 0 0  Suicidal thoughts 0 0 0 0 0  PHQ-9 Score 6 11 12 1 6   Difficult doing work/chores  Somewhat difficult  Not difficult at all    Anhedonia: no Weight changes: no Insomnia: no   Hypersomnia: no Fatigue/loss of energy: no Feelings of worthlessness: no Feelings of guilt: no Impaired concentration/indecisiveness: no Suicidal ideations: no  Crying spells: no Recent Stressors/Life Changes: no   Relationship problems: no   Family stress: no     Financial stress: no    Job stress: no    Recent death/loss: no  Has 2 "lumps" on her low back  and they are concerning her  Relevant past medical, surgical, family and social history reviewed and updated as indicated. Interim medical history since our last visit reviewed. Allergies and medications reviewed and updated.  Review of Systems  Constitutional: Negative.   Respiratory: Negative.    Cardiovascular: Negative.   Gastrointestinal: Negative.   Musculoskeletal:  Positive for back pain. Negative for arthralgias, gait problem, joint swelling, myalgias, neck pain and neck stiffness.  Skin: Negative.   Neurological: Negative.   Psychiatric/Behavioral: Negative.      Per HPI unless specifically indicated above     Objective:    BP 135/64 (BP Location: Left Arm, Patient Position: Sitting, Cuff Size: Normal)   Pulse 92   Temp 98.4 F (36.9 C) (Oral)   Resp 15   Ht 4' 11.45" (1.51 m)   Wt 131 lb (59.4 kg)   LMP 11/28/1971 (Approximate)   SpO2 98%   BMI 26.06 kg/m   Wt Readings from Last 3 Encounters:  08/06/23 131 lb (59.4 kg)  06/22/23 131 lb (59.4 kg)  09/02/22 131 lb 12.8 oz (59.8 kg)    Physical  Exam Vitals and nursing note reviewed.  Constitutional:      General: She is not in acute distress.    Appearance: Normal appearance. She is not ill-appearing, toxic-appearing or diaphoretic.  HENT:     Head: Normocephalic and atraumatic.     Right Ear: External ear normal.     Left Ear: External ear normal.     Nose: Nose normal.     Mouth/Throat:     Mouth: Mucous membranes are moist.     Pharynx: Oropharynx is clear.  Eyes:     General: No scleral icterus.       Right eye: No discharge.        Left eye: No discharge.     Extraocular Movements: Extraocular movements intact.     Conjunctiva/sclera: Conjunctivae normal.     Pupils: Pupils are equal, round, and reactive to light.  Cardiovascular:     Rate and Rhythm: Normal rate and regular rhythm.     Pulses: Normal pulses.     Heart sounds: Normal heart sounds. No murmur heard.    No friction rub. No  gallop.  Pulmonary:     Effort: Pulmonary effort is normal. No respiratory distress.     Breath sounds: Normal breath sounds. No stridor. No wheezing, rhonchi or rales.  Chest:     Chest wall: No tenderness.  Musculoskeletal:        General: Normal range of motion.     Cervical back: Normal range of motion and neck supple.  Skin:    General: Skin is warm and dry.     Capillary Refill: Capillary refill takes less than 2 seconds.     Coloration: Skin is not jaundiced or pale.     Findings: No bruising, erythema, lesion or rash.  Neurological:     General: No focal deficit present.     Mental Status: She is alert and oriented to person, place, and time. Mental status is at baseline.  Psychiatric:        Mood and Affect: Mood normal.        Behavior: Behavior normal.        Thought Content: Thought content normal.        Judgment: Judgment normal.     Results for orders placed or performed in visit on 06/22/23  Microalbumin, Urine Waived   Collection Time: 06/22/23  2:32 PM  Result Value Ref Range   Microalb, Ur Waived 30 (H) 0 - 19 mg/L   Creatinine, Urine Waived 200 10 - 300 mg/dL   Microalb/Creat Ratio <30 <30 mg/g  CBC with Differential/Platelet   Collection Time: 06/22/23  2:33 PM  Result Value Ref Range   WBC 3.4 3.4 - 10.8 x10E3/uL   RBC 3.63 (L) 3.77 - 5.28 x10E6/uL   Hemoglobin 11.0 (L) 11.1 - 15.9 g/dL   Hematocrit 91.4 (L) 78.2 - 46.6 %   MCV 90 79 - 97 fL   MCH 30.3 26.6 - 33.0 pg   MCHC 33.5 31.5 - 35.7 g/dL   RDW 95.6 21.3 - 08.6 %   Platelets 191 150 - 450 x10E3/uL   Neutrophils 63 Not Estab. %   Lymphs 26 Not Estab. %   Monocytes 9 Not Estab. %   Eos 1 Not Estab. %   Basos 1 Not Estab. %   Neutrophils Absolute 2.2 1.4 - 7.0 x10E3/uL   Lymphocytes Absolute 0.9 0.7 - 3.1 x10E3/uL   Monocytes Absolute 0.3 0.1 - 0.9 x10E3/uL   EOS (ABSOLUTE)  0.0 0.0 - 0.4 x10E3/uL   Basophils Absolute 0.0 0.0 - 0.2 x10E3/uL   Immature Granulocytes 0 Not Estab. %    Immature Grans (Abs) 0.0 0.0 - 0.1 x10E3/uL  Comprehensive metabolic panel   Collection Time: 06/22/23  2:33 PM  Result Value Ref Range   Glucose 101 (H) 70 - 99 mg/dL   BUN 21 10 - 36 mg/dL   Creatinine, Ser 1.61 0.57 - 1.00 mg/dL   eGFR 60 >09 UE/AVW/0.98   BUN/Creatinine Ratio 23 12 - 28   Sodium 143 134 - 144 mmol/L   Potassium 4.4 3.5 - 5.2 mmol/L   Chloride 105 96 - 106 mmol/L   CO2 21 20 - 29 mmol/L   Calcium 10.0 8.7 - 10.3 mg/dL   Total Protein 6.5 6.0 - 8.5 g/dL   Albumin 4.6 3.6 - 4.6 g/dL   Globulin, Total 1.9 1.5 - 4.5 g/dL   Bilirubin Total 0.4 0.0 - 1.2 mg/dL   Alkaline Phosphatase 38 (L) 44 - 121 IU/L   AST 19 0 - 40 IU/L   ALT 14 0 - 32 IU/L  Lipid Panel w/o Chol/HDL Ratio   Collection Time: 06/22/23  2:33 PM  Result Value Ref Range   Cholesterol, Total 145 100 - 199 mg/dL   Triglycerides 52 0 - 149 mg/dL   HDL 65 >11 mg/dL   VLDL Cholesterol Cal 11 5 - 40 mg/dL   LDL Chol Calc (NIH) 69 0 - 99 mg/dL  TSH   Collection Time: 06/22/23  2:33 PM  Result Value Ref Range   TSH 0.626 0.450 - 4.500 uIU/mL      Assessment & Plan:   Problem List Items Addressed This Visit       Other   Anxiety - Primary   Will try cutting one of her mirtazapine in 1/2 to see if she tolerates it better if she's feeling anxious. Otherwise doing well. Continue to monitor.        Relevant Medications   mirtazapine (REMERON) 15 MG tablet   Other Visit Diagnoses       Chronic midline low back pain without sciatica       Reassured patient. "knots" are her bones. Will use voltaren as needed. Call with any concerns.   Relevant Medications   mirtazapine (REMERON) 15 MG tablet        Follow up plan: Return in about 4 months (around 12/04/2023).

## 2023-08-10 NOTE — Assessment & Plan Note (Signed)
 Will try cutting one of her mirtazapine in 1/2 to see if she tolerates it better if she's feeling anxious. Otherwise doing well. Continue to monitor.

## 2023-10-10 ENCOUNTER — Other Ambulatory Visit: Payer: Self-pay | Admitting: Family Medicine

## 2023-10-13 NOTE — Telephone Encounter (Signed)
 Requested Prescriptions  Pending Prescriptions Disp Refills   estradiol  (ESTRACE ) 0.5 MG tablet [Pharmacy Med Name: ESTRADIOL  TABS 0.5MG ] 45 tablet 0    Sig: TAKE ONE-HALF (1/2) TABLET DAILY     OB/GYN:  Estrogens Failed - 10/13/2023 11:47 AM      Failed - Mammogram is up-to-date per Health Maintenance      Passed - Last BP in normal range    BP Readings from Last 1 Encounters:  08/06/23 135/64         Passed - Valid encounter within last 12 months    Recent Outpatient Visits           2 months ago Anxiety   [facility] [place], Jerilee Montane, DO       Future Appointments             In 2 months [place] Renshaw, Jerilee Montane, DO [facility]

## 2023-11-24 ENCOUNTER — Ambulatory Visit: Admitting: Nurse Practitioner

## 2023-11-26 ENCOUNTER — Ambulatory Visit (INDEPENDENT_AMBULATORY_CARE_PROVIDER_SITE_OTHER): Admitting: Nurse Practitioner

## 2023-11-26 ENCOUNTER — Encounter: Payer: Self-pay | Admitting: Nurse Practitioner

## 2023-11-26 VITALS — BP 136/70 | HR 75 | Temp 97.8°F | Ht 59.5 in | Wt 130.8 lb

## 2023-11-26 DIAGNOSIS — L89322 Pressure ulcer of left buttock, stage 2: Secondary | ICD-10-CM | POA: Insufficient documentation

## 2023-11-26 DIAGNOSIS — R21 Rash and other nonspecific skin eruption: Secondary | ICD-10-CM | POA: Insufficient documentation

## 2023-11-26 MED ORDER — VALACYCLOVIR HCL 1 G PO TABS: 1000.0000 mg | ORAL_TABLET | Freq: Two times a day (BID) | ORAL | 0 refills | Status: AC

## 2023-11-26 MED ORDER — DOXYCYCLINE HYCLATE 100 MG PO TABS: 100.0000 mg | ORAL_TABLET | Freq: Two times a day (BID) | ORAL | 0 refills | Status: DC

## 2023-11-26 NOTE — Assessment & Plan Note (Signed)
 To upper right buttock, appears like shingles.  CrCl 37.  Will treat with renal dosed Valtrex.  Educated on medication and shingles.  May take Tylenol for pain as needed.

## 2023-11-26 NOTE — Progress Notes (Signed)
 BP 136/70   Pulse 75   Temp 97.8 F (36.6 C) (Oral)   Ht 4' 11.5 (1.511 m)   Wt 130 lb 12.8 oz (59.3 kg)   LMP 11/28/1971 (Approximate)   SpO2 98%   BMI 25.98 kg/m    Subjective:    Patient ID: Karen Velasquez, female    DOB: 1931/04/25, 89 y.o.   MRN: [id number]  HPI: Karen Velasquez is a 89 y.o. female  Chief Complaint  Patient presents with   Skin Irritation    Patient states she has been having skin irritation in her butt crack for the last 2 months. States she has been using Voltaren  gel on the area. States the area is a little painful but does not itch.    Pain    Patient states she has been having pain in her buttock as well. States she does not know when this started. States she feels it the most when sitting on her rollator walker.    SKIN IRRITATION WITH PAIN Is having irritation to gluteal folds, present for the last 2 months.  She placed Voltaren  gel on area which made it worse.  Having pain to buttocks as well, unsure when this started.  Pain started before the skin irritation, sitting on walker seat makes it worse.  Has been taking Tylenol and Neosporin. Has rash to upper right buttock which is new. Duration: months Location: as above History of trauma in area: no Pain: yes Quality: yes Severity: 5/10 Redness: unknown Swelling: unknown Oozing: unknown Pus: unknown Fevers: no Nausea/vomiting: no Status: fluctuating Treatments attempted: Voltaren , Neosporin, and Tylenol  Tetanus: UTD   Relevant past medical, surgical, family and social history reviewed and updated as indicated. Interim medical history since our last visit reviewed. Allergies and medications reviewed and updated.  Review of Systems  Constitutional:  Negative for activity change, appetite change, diaphoresis, fatigue and fever.  Respiratory:  Negative for cough, chest tightness, shortness of breath and wheezing.   Cardiovascular:  Negative for chest pain, palpitations and leg swelling.   Skin:  Positive for rash and wound.  Neurological: Negative.   Psychiatric/Behavioral: Negative.      Per HPI unless specifically indicated above     Objective:    BP 136/70   Pulse 75   Temp 97.8 F (36.6 C) (Oral)   Ht 4' 11.5 (1.511 m)   Wt 130 lb 12.8 oz (59.3 kg)   LMP 11/28/1971 (Approximate)   SpO2 98%   BMI 25.98 kg/m   Wt Readings from Last 3 Encounters:  11/26/23 130 lb 12.8 oz (59.3 kg)  08/06/23 131 lb (59.4 kg)  06/22/23 131 lb (59.4 kg)    Physical Exam Vitals and nursing note reviewed.  Constitutional:      General: She is awake. She is not in acute distress.    Appearance: She is well-developed and well-groomed. She is not ill-appearing or toxic-appearing.  HENT:     Head: Normocephalic.     Right Ear: Hearing and external ear normal.     Left Ear: Hearing and external ear normal.   Eyes:     General: Lids are normal.        Right eye: No discharge.        Left eye: No discharge.     Conjunctiva/sclera: Conjunctivae normal.     Pupils: Pupils are equal, round, and reactive to light.   Neck:     Thyroid : No thyromegaly.     Vascular: No  carotid bruit.   Cardiovascular:     Rate and Rhythm: Normal rate and regular rhythm.     Heart sounds: Normal heart sounds. No murmur heard.    No gallop.  Pulmonary:     Effort: Pulmonary effort is normal. No accessory muscle usage or respiratory distress.     Breath sounds: Normal breath sounds.  Abdominal:     General: Bowel sounds are normal. There is no distension.     Palpations: Abdomen is soft.     Tenderness: There is no abdominal tenderness.   Musculoskeletal:     Cervical back: Normal range of motion and neck supple.     Right lower leg: No edema.     Left lower leg: No edema.  Lymphadenopathy:     Cervical: No cervical adenopathy.   Skin:    General: Skin is warm and dry.       Neurological:     Mental Status: She is alert and oriented to person, place, and time.     Deep Tendon  Reflexes: Reflexes are normal and symmetric.     Reflex Scores:      Brachioradialis reflexes are 2+ on the right side and 2+ on the left side.      Patellar reflexes are 2+ on the right side and 2+ on the left side.  Psychiatric:        Attention and Perception: Attention normal.        Mood and Affect: Mood normal.        Speech: Speech normal.        Behavior: Behavior normal. Behavior is cooperative.        Thought Content: Thought content normal.     Results for orders placed or performed in visit on 06/22/23  Microalbumin, Urine Waived   Collection Time: 06/22/23  2:32 PM  Result Value Ref Range   Microalb, Ur Waived 30 (H) 0 - 19 mg/L   Creatinine, Urine Waived 200 10 - 300 mg/dL   Microalb/Creat Ratio <30 <30 mg/g  CBC with Differential/Platelet   Collection Time: 06/22/23  2:33 PM  Result Value Ref Range   WBC 3.4 3.4 - 10.8 x10E3/uL   RBC 3.63 (L) 3.77 - 5.28 x10E6/uL   Hemoglobin 11.0 (L) 11.1 - 15.9 g/dL   Hematocrit 16.1 (L) 09.6 - 46.6 %   MCV 90 79 - 97 fL   MCH 30.3 26.6 - 33.0 pg   MCHC 33.5 31.5 - 35.7 g/dL   RDW 04.5 40.9 - 81.1 %   Platelets 191 150 - 450 x10E3/uL   Neutrophils 63 Not Estab. %   Lymphs 26 Not Estab. %   Monocytes 9 Not Estab. %   Eos 1 Not Estab. %   Basos 1 Not Estab. %   Neutrophils Absolute 2.2 1.4 - 7.0 x10E3/uL   Lymphocytes Absolute 0.9 0.7 - 3.1 x10E3/uL   Monocytes Absolute 0.3 0.1 - 0.9 x10E3/uL   EOS (ABSOLUTE) 0.0 0.0 - 0.4 x10E3/uL   Basophils Absolute 0.0 0.0 - 0.2 x10E3/uL   Immature Granulocytes 0 Not Estab. %   Immature Grans (Abs) 0.0 0.0 - 0.1 x10E3/uL  Comprehensive metabolic panel   Collection Time: 06/22/23  2:33 PM  Result Value Ref Range   Glucose 101 (H) 70 - 99 mg/dL   BUN 21 10 - 36 mg/dL   Creatinine, Ser 9.14 0.57 - 1.00 mg/dL   eGFR 60 >78 GN/FAO/1.30   BUN/Creatinine Ratio 23 12 - 28   Sodium  143 134 - 144 mmol/L   Potassium 4.4 3.5 - 5.2 mmol/L   Chloride 105 96 - 106 mmol/L   CO2 21 20 - 29  mmol/L   Calcium 10.0 8.7 - 10.3 mg/dL   Total Protein 6.5 6.0 - 8.5 g/dL   Albumin 4.6 3.6 - 4.6 g/dL   Globulin, Total 1.9 1.5 - 4.5 g/dL   Bilirubin Total 0.4 0.0 - 1.2 mg/dL   Alkaline Phosphatase 38 (L) 44 - 121 IU/L   AST 19 0 - 40 IU/L   ALT 14 0 - 32 IU/L  Lipid Panel w/o Chol/HDL Ratio   Collection Time: 06/22/23  2:33 PM  Result Value Ref Range   Cholesterol, Total 145 100 - 199 mg/dL   Triglycerides 52 0 - 149 mg/dL   HDL 65 >60 mg/dL   VLDL Cholesterol Cal 11 5 - 40 mg/dL   LDL Chol Calc (NIH) 69 0 - 99 mg/dL  TSH   Collection Time: 06/22/23  2:33 PM  Result Value Ref Range   TSH 0.626 0.450 - 4.500 uIU/mL      Assessment & Plan:   Problem List Items Addressed This Visit       Musculoskeletal and Integument   Rash   To upper right buttock, appears like shingles.  CrCl 37.  Will treat with renal dosed Valtrex.  Educated on medication and shingles.  May take Tylenol for pain as needed.        Other   Pressure injury of left buttock, stage 2 (HCC) - Primary   To gluteal fold area. Will start Doxycycline 100 MG BID due to erythema and tenderness to area.  Recommend she use donut when sitting to to try to off weight the area so as not to get pressure placed.  Take Tylenol for pain as needed.  Obtain Manuka Honey OTC, cleanse wound well BID and apply this after.  Recommend she start drinking Ensure 2-3 times daily to aide in healing process.        Follow up plan: Return in about 1 week (around 12/03/2023) for Wound CHECK.

## 2023-11-26 NOTE — Assessment & Plan Note (Signed)
 To gluteal fold area. Will start Doxycycline 100 MG BID due to erythema and tenderness to area.  Recommend she use donut when sitting to to try to off weight the area so as not to get pressure placed.  Take Tylenol for pain as needed.  Obtain Manuka Honey OTC, cleanse wound well BID and apply this after.  Recommend she start drinking Ensure 2-3 times daily to aide in healing process.

## 2023-11-26 NOTE — Patient Instructions (Signed)

## 2023-11-28 ENCOUNTER — Other Ambulatory Visit: Payer: Self-pay | Admitting: Family Medicine

## 2023-11-28 NOTE — Patient Instructions (Signed)

## 2023-12-01 NOTE — Telephone Encounter (Signed)
 Requested Prescriptions  Refused Prescriptions Disp Refills   cetirizine  (ZYRTEC ) 10 MG tablet [Pharmacy Med Name: CETIRIZINE  TABS-OTC 10MG ] 90 tablet 3    Sig: TAKE 1 TABLET DAILY     Ear, Nose, and Throat:  Antihistamines 2 Passed - 12/01/2023 12:02 PM      Passed - Cr in normal range and within 360 days    Creatinine, Ser  Date Value Ref Range Status  06/22/2023 0.91 0.57 - 1.00 mg/dL Final         Passed - Valid encounter within last 12 months    Recent Outpatient Visits           5 days ago Pressure injury of left buttock, stage 2 (HCC)   [facility] [place], Melanie T, NP   3 months ago Anxiety   [facility] [place], [place], DO       Future Appointments             In 3 weeks Vicci, Duwaine SQUIBB, DO  [facility]

## 2023-12-03 ENCOUNTER — Telehealth: Payer: Self-pay

## 2023-12-03 ENCOUNTER — Ambulatory Visit (INDEPENDENT_AMBULATORY_CARE_PROVIDER_SITE_OTHER): Admitting: Nurse Practitioner

## 2023-12-03 ENCOUNTER — Encounter: Payer: Self-pay | Admitting: Nurse Practitioner

## 2023-12-03 VITALS — BP 154/76 | HR 90 | Temp 98.0°F | Ht 59.5 in | Wt 128.6 lb

## 2023-12-03 DIAGNOSIS — L89322 Pressure ulcer of left buttock, stage 2: Secondary | ICD-10-CM

## 2023-12-03 DIAGNOSIS — R21 Rash and other nonspecific skin eruption: Secondary | ICD-10-CM

## 2023-12-03 MED ORDER — SANTYL 250 UNIT/GM EX OINT: 1.0000 | TOPICAL_OINTMENT | Freq: Two times a day (BID) | CUTANEOUS | 1 refills | Status: DC

## 2023-12-03 NOTE — Telephone Encounter (Signed)
 Routing to provider who saw the patient.

## 2023-12-03 NOTE — Assessment & Plan Note (Signed)
 To gluteal fold area. Completed abx therapy and overall redness improved.  Recommend she use donut when sitting to to try to off weight the area so as to not get pressure placed on wound.  Take Tylenol for pain as needed.  Ordered Santyl to apply to wound, but if not covered then continue Manuka Honey OTC, cleanse wound well BID and apply this after.  Recommend she drink Ensure 2-3 times daily to aide in healing process. Order for home health nursing to assist in wound care and prevent area from worsening.

## 2023-12-03 NOTE — Assessment & Plan Note (Signed)
 Area crusted over, no further rash.

## 2023-12-03 NOTE — Telephone Encounter (Signed)
 Copied from CRM [id number]. Topic: Clinical - Prescription Issue >> Dec 03, 2023  1:08 PM Selinda RAMAN wrote: Reason CRM: Dawna the daughter of the patient called in stating the collagenase (SANTYL) 250 UNIT/GM ointment is on backorder and unable to get. Is there something else she can use that the pharmacy has in stock? Dawna is going out of town Saturday and she really wants her mother to have something to help with this wound as soon as possible. Please assist patient further by calling Bridgette at [phone]

## 2023-12-03 NOTE — Progress Notes (Signed)
 BP (!) 154/76   Pulse 90   Temp 98 F (36.7 C) (Oral)   Ht 4' 11.5 (1.511 m)   Wt 128 lb 9.6 oz (58.3 kg)   LMP 11/28/1971 (Approximate)   SpO2 99%   BMI 25.54 kg/m    Subjective:    Patient ID: Merlynn SHAUNNA Lipoma, female    DOB: [birth year], 89 y.o.   MRN: [id number]  HPI: WILENE PHARO is a 89 y.o. female  Chief Complaint  Patient presents with   Wound Check    1 week f/up- left buttock pressure injury   Daughter at bedside to assist with HPI.  SKIN IRRITATION WITH PAIN Follow-up for shingles and sore to upper left gluteal fold.  Was seen on 11/26/23 for this and ordered Valtrex  + placing Manuka honey.  She is still having pain to area of pressure sore.  Has been taking Tylenol and using Manuka Honey. Shingles is overall healed over per family. Did one round of abx therapy. Duration: months Location: as above History of trauma in area: no Pain: yes Quality: yes Severity: 5/10 Redness: unknown Swelling: unknown Oozing: unknown Pus: unknown Fevers: no Nausea/vomiting: no Status: fluctuating Treatments attempted: Voltaren , Neosporin, and Tylenol, Manuka Honey  Tetanus: UTD   Relevant past medical, surgical, family and social history reviewed and updated as indicated. Interim medical history since our last visit reviewed. Allergies and medications reviewed and updated.  Review of Systems  Constitutional:  Negative for activity change, appetite change, diaphoresis, fatigue and fever.  Respiratory:  Negative for cough, chest tightness, shortness of breath and wheezing.   Cardiovascular:  Negative for chest pain, palpitations and leg swelling.  Skin:  Positive for rash and wound.  Neurological: Negative.   Psychiatric/Behavioral: Negative.      Per HPI unless specifically indicated above     Objective:    BP (!) 154/76   Pulse 90   Temp 98 F (36.7 C) (Oral)   Ht 4' 11.5 (1.511 m)   Wt 128 lb 9.6 oz (58.3 kg)   LMP 11/28/1971 (Approximate)   SpO2 99%    BMI 25.54 kg/m   Wt Readings from Last 3 Encounters:  12/03/23 128 lb 9.6 oz (58.3 kg)  11/26/23 130 lb 12.8 oz (59.3 kg)  08/06/23 131 lb (59.4 kg)    Physical Exam Vitals and nursing note reviewed.  Constitutional:      General: She is awake. She is not in acute distress.    Appearance: She is well-developed and well-groomed. She is not ill-appearing or toxic-appearing.  HENT:     Head: Normocephalic.     Right Ear: Hearing and external ear normal.     Left Ear: Hearing and external ear normal.   Eyes:     General: Lids are normal.        Right eye: No discharge.        Left eye: No discharge.     Conjunctiva/sclera: Conjunctivae normal.     Pupils: Pupils are equal, round, and reactive to light.   Neck:     Thyroid : No thyromegaly.     Vascular: No carotid bruit.   Cardiovascular:     Rate and Rhythm: Normal rate and regular rhythm.     Heart sounds: Normal heart sounds. No murmur heard.    No gallop.  Pulmonary:     Effort: Pulmonary effort is normal. No accessory muscle usage or respiratory distress.     Breath sounds: Normal breath sounds.  Abdominal:  General: Bowel sounds are normal. There is no distension.     Palpations: Abdomen is soft.     Tenderness: There is no abdominal tenderness.   Musculoskeletal:     Cervical back: Normal range of motion and neck supple.     Right lower leg: No edema.     Left lower leg: No edema.  Lymphadenopathy:     Cervical: No cervical adenopathy.   Skin:    General: Skin is warm and dry.       Neurological:     Mental Status: She is alert and oriented to person, place, and time.     Deep Tendon Reflexes: Reflexes are normal and symmetric.     Reflex Scores:      Brachioradialis reflexes are 2+ on the right side and 2+ on the left side.      Patellar reflexes are 2+ on the right side and 2+ on the left side.  Psychiatric:        Attention and Perception: Attention normal.        Mood and Affect: Mood normal.         Speech: Speech normal.        Behavior: Behavior normal. Behavior is cooperative.        Thought Content: Thought content normal.     Results for orders placed or performed in visit on 06/22/23  Microalbumin, Urine Waived   Collection Time: 06/22/23  2:32 PM  Result Value Ref Range   Microalb, Ur Waived 30 (H) 0 - 19 mg/L   Creatinine, Urine Waived 200 10 - 300 mg/dL   Microalb/Creat Ratio <30 <30 mg/g  CBC with Differential/Platelet   Collection Time: 06/22/23  2:33 PM  Result Value Ref Range   WBC 3.4 3.4 - 10.8 x10E3/uL   RBC 3.63 (L) 3.77 - 5.28 x10E6/uL   Hemoglobin 11.0 (L) 11.1 - 15.9 g/dL   Hematocrit 67.1 (L) 65.9 - 46.6 %   MCV 90 79 - 97 fL   MCH 30.3 26.6 - 33.0 pg   MCHC 33.5 31.5 - 35.7 g/dL   RDW 87.0 88.2 - 84.5 %   Platelets 191 150 - 450 x10E3/uL   Neutrophils 63 Not Estab. %   Lymphs 26 Not Estab. %   Monocytes 9 Not Estab. %   Eos 1 Not Estab. %   Basos 1 Not Estab. %   Neutrophils Absolute 2.2 1.4 - 7.0 x10E3/uL   Lymphocytes Absolute 0.9 0.7 - 3.1 x10E3/uL   Monocytes Absolute 0.3 0.1 - 0.9 x10E3/uL   EOS (ABSOLUTE) 0.0 0.0 - 0.4 x10E3/uL   Basophils Absolute 0.0 0.0 - 0.2 x10E3/uL   Immature Granulocytes 0 Not Estab. %   Immature Grans (Abs) 0.0 0.0 - 0.1 x10E3/uL  Comprehensive metabolic panel   Collection Time: 06/22/23  2:33 PM  Result Value Ref Range   Glucose 101 (H) 70 - 99 mg/dL   BUN 21 10 - 36 mg/dL   Creatinine, Ser 9.08 0.57 - 1.00 mg/dL   eGFR 60 >40 fO/fpw/8.26   BUN/Creatinine Ratio 23 12 - 28   Sodium 143 134 - 144 mmol/L   Potassium 4.4 3.5 - 5.2 mmol/L   Chloride 105 96 - 106 mmol/L   CO2 21 20 - 29 mmol/L   Calcium 10.0 8.7 - 10.3 mg/dL   Total Protein 6.5 6.0 - 8.5 g/dL   Albumin 4.6 3.6 - 4.6 g/dL   Globulin, Total 1.9 1.5 - 4.5 g/dL   Bilirubin Total  0.4 0.0 - 1.2 mg/dL   Alkaline Phosphatase 38 (L) 44 - 121 IU/L   AST 19 0 - 40 IU/L   ALT 14 0 - 32 IU/L  Lipid Panel w/o Chol/HDL Ratio   Collection Time: 06/22/23   2:33 PM  Result Value Ref Range   Cholesterol, Total 145 100 - 199 mg/dL   Triglycerides 52 0 - 149 mg/dL   HDL 65 >60 mg/dL   VLDL Cholesterol Cal 11 5 - 40 mg/dL   LDL Chol Calc (NIH) 69 0 - 99 mg/dL  TSH   Collection Time: 06/22/23  2:33 PM  Result Value Ref Range   TSH 0.626 0.450 - 4.500 uIU/mL      Assessment & Plan:   Problem List Items Addressed This Visit       Musculoskeletal and Integument   Rash   Area crusted over, no further rash.        Other   Pressure injury of left buttock, stage 2 (HCC) - Primary   To gluteal fold area. Completed abx therapy and overall redness improved.  Recommend she use donut when sitting to to try to off weight the area so as to not get pressure placed on wound.  Take Tylenol for pain as needed.  Ordered Santyl to apply to wound, but if not covered then continue Manuka Honey OTC, cleanse wound well BID and apply this after.  Recommend she drink Ensure 2-3 times daily to aide in healing process. Order for home health nursing to assist in wound care and prevent area from worsening.      Relevant Orders   Ambulatory referral to Home Health      Follow up plan: Return in about 2 weeks (around 12/17/2023) for WOUND CHECK.

## 2023-12-07 NOTE — Telephone Encounter (Signed)
 Called and LVM asking for patient's daughter to please return my call.   OK for E2C2 to speak to patient and advise the patient's daughter of Jolene's message.

## 2023-12-08 ENCOUNTER — Ambulatory Visit: Payer: Self-pay | Admitting: *Deleted

## 2023-12-08 NOTE — Telephone Encounter (Signed)
 FYI Only or Action Required?: Action required by provider: daughter is requesting recommendation and an update on home health.  Patient was last seen in primary care on 12/03/2023 by Valerio Melanie DASEN, NP. Called Nurse Triage reporting Advice Only. Symptoms began several days ago. Interventions attempted: Other: wound dressing instructions per provider instructions. Symptoms are: unchanged.  Triage Disposition: Call PCP Within 24 Hours  Patient/caregiver understands and will follow disposition?:   Reason for Disposition  [1] Caller requests to speak ONLY to PCP AND [2] NON-URGENT question  Answer Assessment - Initial Assessment Questions 1. REASON FOR CALL or QUESTION: What is your reason for calling today? or How can I best help you? or What question do you have that I can help answer?     Patient's daughter called back-patient is having difficulty with honey being placed on her wound. Daughter states whenever patient takes her pants off, the honey comes off from her bottom and causes scabs to be removed. Daughter states she was told for no dressing to be placed on the wound and states it would be very difficult to put anything there. Patient is very uptight with the wound and understandable frustrated. Daughter is wanting to guidance of what to do since initial wound care instructions aren't working well. Daughter is asking for a call back to her. [phone] 2. CALLER: Document the source of call. (e.g., laboratory, patient).     Patient's daughter Dawna  Protocols used: PCP Call - No Triage-A-AH

## 2023-12-08 NOTE — Telephone Encounter (Signed)
 Called and notified patient's daughter of Jessie's message. Patient daughter states that they have heard from [facility] and appreciates our help.

## 2023-12-08 NOTE — Telephone Encounter (Signed)
 Referral team, can we check on this for the patients daughter please?

## 2023-12-08 NOTE — Telephone Encounter (Signed)
 Copied from CRM [id number]. Topic: Clinical - Medication Question >> Dec 08, 2023  8:23 AM Dawna HERO wrote: Reason for CRM: patients daughter states she told her mother to put vaseline on her scabs because once she pulls anything off of the wound the scabs come off so she says if she shouldn't do that please let her know and also I advised her of the message sent from Jolene.

## 2023-12-09 DIAGNOSIS — I129 Hypertensive chronic kidney disease with stage 1 through stage 4 chronic kidney disease, or unspecified chronic kidney disease: Secondary | ICD-10-CM | POA: Diagnosis not present

## 2023-12-09 DIAGNOSIS — D692 Other nonthrombocytopenic purpura: Secondary | ICD-10-CM | POA: Diagnosis not present

## 2023-12-09 DIAGNOSIS — M1990 Unspecified osteoarthritis, unspecified site: Secondary | ICD-10-CM | POA: Diagnosis not present

## 2023-12-09 DIAGNOSIS — L89322 Pressure ulcer of left buttock, stage 2: Secondary | ICD-10-CM | POA: Diagnosis not present

## 2023-12-09 DIAGNOSIS — K219 Gastro-esophageal reflux disease without esophagitis: Secondary | ICD-10-CM | POA: Diagnosis not present

## 2023-12-09 DIAGNOSIS — F419 Anxiety disorder, unspecified: Secondary | ICD-10-CM | POA: Diagnosis not present

## 2023-12-09 DIAGNOSIS — R21 Rash and other nonspecific skin eruption: Secondary | ICD-10-CM | POA: Diagnosis not present

## 2023-12-09 DIAGNOSIS — M4800 Spinal stenosis, site unspecified: Secondary | ICD-10-CM | POA: Diagnosis not present

## 2023-12-09 DIAGNOSIS — N183 Chronic kidney disease, stage 3 unspecified: Secondary | ICD-10-CM | POA: Diagnosis not present

## 2023-12-09 DIAGNOSIS — E785 Hyperlipidemia, unspecified: Secondary | ICD-10-CM | POA: Diagnosis not present

## 2023-12-09 DIAGNOSIS — Z87891 Personal history of nicotine dependence: Secondary | ICD-10-CM | POA: Diagnosis not present

## 2023-12-09 DIAGNOSIS — M8580 Other specified disorders of bone density and structure, unspecified site: Secondary | ICD-10-CM | POA: Diagnosis not present

## 2023-12-09 DIAGNOSIS — M51369 Other intervertebral disc degeneration, lumbar region without mention of lumbar back pain or lower extremity pain: Secondary | ICD-10-CM | POA: Diagnosis not present

## 2023-12-09 DIAGNOSIS — J449 Chronic obstructive pulmonary disease, unspecified: Secondary | ICD-10-CM | POA: Diagnosis not present

## 2023-12-09 NOTE — Telephone Encounter (Unsigned)
 Copied from CRM [id number]. Topic: General - Other >> Dec 09, 2023  2:18 PM Tobias L wrote: Reason for CRM: Jewel nurse w/ [facility] calling to inform not current wound is not skillable but will be doing one more visit for small pressure area. Patient does have yeast in gluteal folds and will need treatment for it. Jewel has recommemded for patient to do zinc ointment due to pink healthy wound bed, also recommend foam bandage to be changed weekly for pressure relief. Protein diet and vitamin C recommended for supplements. Patient does not want any pressure relief equipment.   Best call back number for Jewel: [phone]

## 2023-12-09 NOTE — Telephone Encounter (Signed)
 Sounds good to me. OK to give verbal orders if needed.

## 2023-12-16 ENCOUNTER — Telehealth: Payer: Self-pay

## 2023-12-16 MED ORDER — SANTYL 250 UNIT/GM EX OINT: 1.0000 | TOPICAL_OINTMENT | Freq: Two times a day (BID) | CUTANEOUS | 1 refills | Status: DC

## 2023-12-16 NOTE — Telephone Encounter (Signed)
 Copied from CRM [id number]. Topic: Clinical - Prescription Issue >> Dec 16, 2023 10:00 AM Tobias CROME wrote: Reason for CRM: Daughter Scientist, water quality reached out to her about collagenase  (SANTYL ) 250 UNIT/GM ointment & let her know they have faxed office multiple times about prescription and have not heard back.   Daughter requesting someone look out for fax from [facility].   Please follow up with Daughter, [phone]

## 2023-12-16 NOTE — Telephone Encounter (Signed)
 Pharmacy needs wound dimensions on the prescription for the ointment.

## 2023-12-16 NOTE — Telephone Encounter (Signed)
 Called and notified patient's daughter that the prescription has been fixed and resent.

## 2023-12-17 DIAGNOSIS — N183 Chronic kidney disease, stage 3 unspecified: Secondary | ICD-10-CM | POA: Diagnosis not present

## 2023-12-17 DIAGNOSIS — R21 Rash and other nonspecific skin eruption: Secondary | ICD-10-CM | POA: Diagnosis not present

## 2023-12-17 DIAGNOSIS — J449 Chronic obstructive pulmonary disease, unspecified: Secondary | ICD-10-CM | POA: Diagnosis not present

## 2023-12-17 DIAGNOSIS — L89322 Pressure ulcer of left buttock, stage 2: Secondary | ICD-10-CM | POA: Diagnosis not present

## 2023-12-17 DIAGNOSIS — I129 Hypertensive chronic kidney disease with stage 1 through stage 4 chronic kidney disease, or unspecified chronic kidney disease: Secondary | ICD-10-CM | POA: Diagnosis not present

## 2023-12-17 DIAGNOSIS — M51369 Other intervertebral disc degeneration, lumbar region without mention of lumbar back pain or lower extremity pain: Secondary | ICD-10-CM | POA: Diagnosis not present

## 2023-12-22 ENCOUNTER — Encounter: Payer: Self-pay | Admitting: Family Medicine

## 2023-12-22 ENCOUNTER — Ambulatory Visit (INDEPENDENT_AMBULATORY_CARE_PROVIDER_SITE_OTHER): Payer: Medicare Other | Admitting: Family Medicine

## 2023-12-22 VITALS — BP 108/66 | HR 76 | Temp 98.2°F | Resp 15 | Ht 59.49 in | Wt 129.0 lb

## 2023-12-22 DIAGNOSIS — E782 Mixed hyperlipidemia: Secondary | ICD-10-CM | POA: Diagnosis not present

## 2023-12-22 DIAGNOSIS — I129 Hypertensive chronic kidney disease with stage 1 through stage 4 chronic kidney disease, or unspecified chronic kidney disease: Secondary | ICD-10-CM

## 2023-12-22 DIAGNOSIS — F419 Anxiety disorder, unspecified: Secondary | ICD-10-CM | POA: Diagnosis not present

## 2023-12-22 DIAGNOSIS — R21 Rash and other nonspecific skin eruption: Secondary | ICD-10-CM | POA: Diagnosis not present

## 2023-12-22 DIAGNOSIS — M51369 Other intervertebral disc degeneration, lumbar region without mention of lumbar back pain or lower extremity pain: Secondary | ICD-10-CM | POA: Diagnosis not present

## 2023-12-22 DIAGNOSIS — L89322 Pressure ulcer of left buttock, stage 2: Secondary | ICD-10-CM

## 2023-12-22 DIAGNOSIS — J449 Chronic obstructive pulmonary disease, unspecified: Secondary | ICD-10-CM | POA: Diagnosis not present

## 2023-12-22 DIAGNOSIS — N183 Chronic kidney disease, stage 3 unspecified: Secondary | ICD-10-CM | POA: Diagnosis not present

## 2023-12-22 MED ORDER — MIRTAZAPINE 15 MG PO TABS: 7.5000 mg | ORAL_TABLET | Freq: Every day | ORAL | 1 refills | Status: DC

## 2023-12-22 MED ORDER — EZETIMIBE 10 MG PO TABS: 10.0000 mg | ORAL_TABLET | Freq: Every day | ORAL | 1 refills | Status: DC

## 2023-12-22 MED ORDER — LISINOPRIL 5 MG PO TABS: 5.0000 mg | ORAL_TABLET | Freq: Every day | ORAL | 1 refills | Status: DC

## 2023-12-22 MED ORDER — SANTYL 250 UNIT/GM EX OINT: 1.0000 | TOPICAL_OINTMENT | Freq: Two times a day (BID) | CUTANEOUS | 1 refills | Status: AC

## 2023-12-22 MED ORDER — FENOFIBRATE MICRONIZED 130 MG PO CAPS: ORAL_CAPSULE | ORAL | 1 refills | Status: DC

## 2023-12-22 NOTE — Patient Instructions (Addendum)
 Please bring your medications with your next visit.

## 2023-12-22 NOTE — Assessment & Plan Note (Signed)
 Under good control on current regimen. Continue current regimen. Continue to monitor. Call with any concerns. Refills given. Labs drawn today.

## 2023-12-22 NOTE — Assessment & Plan Note (Signed)
 Under good control on current regimen. Continue current regimen. Continue to monitor. Call with any concerns. Refills given.

## 2023-12-22 NOTE — Assessment & Plan Note (Addendum)
 Significantly improved. Continue current management. Call with any concerns.

## 2023-12-22 NOTE — Progress Notes (Signed)
 BP 108/66 (BP Location: Left Arm, Patient Position: Sitting, Cuff Size: Normal)   Pulse 76   Temp 98.2 F (36.8 C) (Oral)   Resp 15   Ht 4' 11.49 (1.511 m)   Wt 129 lb (58.5 kg)   LMP 11/28/1971 (Approximate)   SpO2 97%   BMI 25.63 kg/m    Subjective:    Patient ID: Karen Velasquez, female    DOB: 10-24-1930, 89 y.o.   MRN: [id number]  HPI: Karen Velasquez is a 89 y.o. female  Chief Complaint  Patient presents with   Wound Check   Rash    Turned out to be shingles and has since healed. No current issues.    Pressure injury    Pressure injury of left buttock, stage 2   Feeling better today than she has been. No pain. Pressure ulcer is improving. Has been having home health out.   HYPERTENSION / HYPERLIPIDEMIA Satisfied with current treatment? yes Duration of hypertension: chronic BP monitoring frequency: not checking BP medication side effects: no Past BP meds: lisinopril  Duration of hyperlipidemia: chronic Cholesterol medication side effects: yes Cholesterol supplements: none Past cholesterol medications: zetia ,  Medication compliance: excellent compliance Aspirin: no Recent stressors: no Recurrent headaches: no Visual changes: no Palpitations: no Dyspnea: no Chest pain: no Lower extremity edema: no Dizzy/lightheaded: no  DEPRESSION Mood status: controlled Satisfied with current treatment?: yes Symptom severity: mild  Duration of current treatment : chronic Side effects: no Medication compliance: excellent compliance Psychotherapy/counseling: no  Previous psychiatric medications: mirtazapine  Depressed mood: no Anxious mood: no Anhedonia: no Significant weight loss or gain: no Insomnia: no  Fatigue: no Feelings of worthlessness or guilt: no Impaired concentration/indecisiveness: no Suicidal ideations: no Hopelessness: no Crying spells: no    08/06/2023    1:38 PM 06/22/2023    2:51 PM 06/22/2023    2:32 PM 09/02/2022    1:43 PM 02/11/2022    8:57  AM  Depression screen PHQ 2/9  Decreased Interest 2 2 3  0 0  Down, Depressed, Hopeless 0 2 3 0 0  PHQ - 2 Score 2 4 6  0 0  Altered sleeping 2 3 3  0 3  Tired, decreased energy 2 1 3 1 3   Change in appetite 0 0 0 0 0  Feeling bad or failure about yourself  0 0 0 0 0  Trouble concentrating 0 3 0 0 0  Moving slowly or fidgety/restless 0 0 0 0 0  Suicidal thoughts 0 0 0 0 0  PHQ-9 Score 6 11 12 1 6   Difficult doing work/chores  Somewhat difficult  Not difficult at all       Relevant past medical, surgical, family and social history reviewed and updated as indicated. Interim medical history since our last visit reviewed. Allergies and medications reviewed and updated.  Review of Systems  Constitutional: Negative.   Respiratory: Negative.    Cardiovascular: Negative.   Musculoskeletal: Negative.   Skin:  Positive for wound. Negative for color change, pallor and rash.  Neurological: Negative.   Psychiatric/Behavioral: Negative.      Per HPI unless specifically indicated above     Objective:    BP 108/66 (BP Location: Left Arm, Patient Position: Sitting, Cuff Size: Normal)   Pulse 76   Temp 98.2 F (36.8 C) (Oral)   Resp 15   Ht 4' 11.49 (1.511 m)   Wt 129 lb (58.5 kg)   LMP 11/28/1971 (Approximate)   SpO2 97%   BMI 25.63 kg/m  Wt Readings from Last 3 Encounters:  12/22/23 129 lb (58.5 kg)  12/03/23 128 lb 9.6 oz (58.3 kg)  11/26/23 130 lb 12.8 oz (59.3 kg)    Physical Exam Vitals and nursing note reviewed.  Constitutional:      General: She is not in acute distress.    Appearance: Normal appearance. She is not ill-appearing, toxic-appearing or diaphoretic.  HENT:     Head: Normocephalic and atraumatic.     Right Ear: External ear normal.     Left Ear: External ear normal.     Nose: Nose normal.     Mouth/Throat:     Mouth: Mucous membranes are moist.     Pharynx: Oropharynx is clear.  Eyes:     General: No scleral icterus.       Right eye: No discharge.         Left eye: No discharge.     Extraocular Movements: Extraocular movements intact.     Conjunctiva/sclera: Conjunctivae normal.     Pupils: Pupils are equal, round, and reactive to light.  Cardiovascular:     Rate and Rhythm: Normal rate and regular rhythm.     Pulses: Normal pulses.     Heart sounds: Normal heart sounds. No murmur heard.    No friction rub. No gallop.  Pulmonary:     Effort: Pulmonary effort is normal. No respiratory distress.     Breath sounds: Normal breath sounds. No stridor. No wheezing, rhonchi or rales.  Chest:     Chest wall: No tenderness.  Musculoskeletal:        General: Normal range of motion.     Cervical back: Normal range of motion and neck supple.  Skin:    General: Skin is warm and dry.     Capillary Refill: Capillary refill takes less than 2 seconds.     Coloration: Skin is not jaundiced or pale.     Findings: No bruising, erythema, lesion or rash.     Comments: 0.5 x 0.5 cm wound on L buttock  Neurological:     General: No focal deficit present.     Mental Status: She is alert and oriented to person, place, and time. Mental status is at baseline.  Psychiatric:        Mood and Affect: Mood normal.        Behavior: Behavior normal.        Thought Content: Thought content normal.        Judgment: Judgment normal.     Results for orders placed or performed in visit on 06/22/23  Microalbumin, Urine Waived   Collection Time: 06/22/23  2:32 PM  Result Value Ref Range   Microalb, Ur Waived 30 (H) 0 - 19 mg/L   Creatinine, Urine Waived 200 10 - 300 mg/dL   Microalb/Creat Ratio <30 <30 mg/g  CBC with Differential/Platelet   Collection Time: 06/22/23  2:33 PM  Result Value Ref Range   WBC 3.4 3.4 - 10.8 x10E3/uL   RBC 3.63 (L) 3.77 - 5.28 x10E6/uL   Hemoglobin 11.0 (L) 11.1 - 15.9 g/dL   Hematocrit 67.1 (L) 65.9 - 46.6 %   MCV 90 79 - 97 fL   MCH 30.3 26.6 - 33.0 pg   MCHC 33.5 31.5 - 35.7 g/dL   RDW 87.0 88.2 - 84.5 %   Platelets 191  150 - 450 x10E3/uL   Neutrophils 63 Not Estab. %   Lymphs 26 Not Estab. %   Monocytes 9 Not Estab. %  Eos 1 Not Estab. %   Basos 1 Not Estab. %   Neutrophils Absolute 2.2 1.4 - 7.0 x10E3/uL   Lymphocytes Absolute 0.9 0.7 - 3.1 x10E3/uL   Monocytes Absolute 0.3 0.1 - 0.9 x10E3/uL   EOS (ABSOLUTE) 0.0 0.0 - 0.4 x10E3/uL   Basophils Absolute 0.0 0.0 - 0.2 x10E3/uL   Immature Granulocytes 0 Not Estab. %   Immature Grans (Abs) 0.0 0.0 - 0.1 x10E3/uL  Comprehensive metabolic panel   Collection Time: 06/22/23  2:33 PM  Result Value Ref Range   Glucose 101 (H) 70 - 99 mg/dL   BUN 21 10 - 36 mg/dL   Creatinine, Ser 9.08 0.57 - 1.00 mg/dL   eGFR 60 >40 fO/fpw/8.26   BUN/Creatinine Ratio 23 12 - 28   Sodium 143 134 - 144 mmol/L   Potassium 4.4 3.5 - 5.2 mmol/L   Chloride 105 96 - 106 mmol/L   CO2 21 20 - 29 mmol/L   Calcium 10.0 8.7 - 10.3 mg/dL   Total Protein 6.5 6.0 - 8.5 g/dL   Albumin 4.6 3.6 - 4.6 g/dL   Globulin, Total 1.9 1.5 - 4.5 g/dL   Bilirubin Total 0.4 0.0 - 1.2 mg/dL   Alkaline Phosphatase 38 (L) 44 - 121 IU/L   AST 19 0 - 40 IU/L   ALT 14 0 - 32 IU/L  Lipid Panel w/o Chol/HDL Ratio   Collection Time: 06/22/23  2:33 PM  Result Value Ref Range   Cholesterol, Total 145 100 - 199 mg/dL   Triglycerides 52 0 - 149 mg/dL   HDL 65 >60 mg/dL   VLDL Cholesterol Cal 11 5 - 40 mg/dL   LDL Chol Calc (NIH) 69 0 - 99 mg/dL  TSH   Collection Time: 06/22/23  2:33 PM  Result Value Ref Range   TSH 0.626 0.450 - 4.500 uIU/mL      Assessment & Plan:   Problem List Items Addressed This Visit       Genitourinary   Benign hypertensive renal disease   Under good control on current regimen. Continue current regimen. Continue to monitor. Call with any concerns. Refills given. Labs drawn today.        Relevant Orders   CBC with Differential/Platelet   Comprehensive metabolic panel with GFR     Other   Hyperlipidemia   Under good control on current regimen. Continue current  regimen. Continue to monitor. Call with any concerns. Refills given. Labs drawn today.       Relevant Medications   ezetimibe  (ZETIA ) 10 MG tablet   fenofibrate  micronized (ANTARA ) 130 MG capsule   lisinopril  (ZESTRIL ) 5 MG tablet   Other Relevant Orders   CBC with Differential/Platelet   Comprehensive metabolic panel with GFR   Lipid Panel w/o Chol/HDL Ratio   Anxiety   Under good control on current regimen. Continue current regimen. Continue to monitor. Call with any concerns. Refills given.       Relevant Medications   mirtazapine  (REMERON ) 15 MG tablet   Pressure injury of left buttock, stage 2 (HCC) - Primary   Significantly improved. Continue current management. Call with any concerns.         Follow up plan: Return in about 3 months (around 03/23/2024).

## 2023-12-23 ENCOUNTER — Encounter: Payer: Self-pay | Admitting: Family Medicine

## 2023-12-23 LAB — CBC WITH DIFFERENTIAL/PLATELET
Basophils Absolute: 0 x10E3/uL (ref 0.0–0.2)
Basos: 1 %
EOS (ABSOLUTE): 0 x10E3/uL (ref 0.0–0.4)
Eos: 1 %
Hematocrit: 33.4 % — ABNORMAL LOW (ref 34.0–46.6)
Hemoglobin: 10.5 g/dL — ABNORMAL LOW (ref 11.1–15.9)
Immature Grans (Abs): 0 x10E3/uL (ref 0.0–0.1)
Immature Granulocytes: 0 %
Lymphocytes Absolute: 1.1 x10E3/uL (ref 0.7–3.1)
Lymphs: 26 %
MCH: 30 pg (ref 26.6–33.0)
MCHC: 31.4 g/dL — ABNORMAL LOW (ref 31.5–35.7)
MCV: 95 fL (ref 79–97)
Monocytes Absolute: 0.4 x10E3/uL (ref 0.1–0.9)
Monocytes: 9 %
Neutrophils Absolute: 2.8 x10E3/uL (ref 1.4–7.0)
Neutrophils: 62 %
Platelets: 170 x10E3/uL (ref 150–450)
RBC: 3.5 x10E6/uL — ABNORMAL LOW (ref 3.77–5.28)
RDW: 14.5 % (ref 11.7–15.4)
WBC: 4.4 x10E3/uL (ref 3.4–10.8)

## 2023-12-23 LAB — COMPREHENSIVE METABOLIC PANEL WITH GFR
ALT: 13 IU/L (ref 0–32)
AST: 20 IU/L (ref 0–40)
Albumin: 4.2 g/dL (ref 3.6–4.6)
Alkaline Phosphatase: 37 IU/L — ABNORMAL LOW (ref 44–121)
BUN/Creatinine Ratio: 21 (ref 12–28)
BUN: 19 mg/dL (ref 10–36)
Bilirubin Total: 0.3 mg/dL (ref 0.0–1.2)
CO2: 19 mmol/L — ABNORMAL LOW (ref 20–29)
Calcium: 9.4 mg/dL (ref 8.7–10.3)
Chloride: 105 mmol/L (ref 96–106)
Creatinine, Ser: 0.92 mg/dL (ref 0.57–1.00)
Globulin, Total: 2 g/dL (ref 1.5–4.5)
Glucose: 91 mg/dL (ref 70–99)
Potassium: 4.3 mmol/L (ref 3.5–5.2)
Sodium: 140 mmol/L (ref 134–144)
Total Protein: 6.2 g/dL (ref 6.0–8.5)
eGFR: 59 mL/min/1.73 — ABNORMAL LOW (ref >59)

## 2023-12-23 LAB — LIPID PANEL W/O CHOL/HDL RATIO
Cholesterol, Total: 138 mg/dL (ref 100–199)
HDL: 62 mg/dL (ref >39)
LDL Chol Calc (NIH): 59 mg/dL (ref 0–99)
Triglycerides: 94 mg/dL (ref 0–149)
VLDL Cholesterol Cal: 17 mg/dL (ref 5–40)

## 2023-12-29 DIAGNOSIS — I129 Hypertensive chronic kidney disease with stage 1 through stage 4 chronic kidney disease, or unspecified chronic kidney disease: Secondary | ICD-10-CM | POA: Diagnosis not present

## 2023-12-29 DIAGNOSIS — N183 Chronic kidney disease, stage 3 unspecified: Secondary | ICD-10-CM | POA: Diagnosis not present

## 2023-12-29 DIAGNOSIS — M51369 Other intervertebral disc degeneration, lumbar region without mention of lumbar back pain or lower extremity pain: Secondary | ICD-10-CM | POA: Diagnosis not present

## 2023-12-29 DIAGNOSIS — L89322 Pressure ulcer of left buttock, stage 2: Secondary | ICD-10-CM | POA: Diagnosis not present

## 2023-12-29 DIAGNOSIS — R21 Rash and other nonspecific skin eruption: Secondary | ICD-10-CM | POA: Diagnosis not present

## 2023-12-29 DIAGNOSIS — J449 Chronic obstructive pulmonary disease, unspecified: Secondary | ICD-10-CM | POA: Diagnosis not present

## 2023-12-30 ENCOUNTER — Ambulatory Visit: Payer: Self-pay | Admitting: Family Medicine

## 2023-12-30 DIAGNOSIS — D649 Anemia, unspecified: Secondary | ICD-10-CM

## 2023-12-30 MED ORDER — IRON (FERROUS SULFATE) 325 (65 FE) MG PO TABS: 325.0000 mg | ORAL_TABLET | Freq: Every day | ORAL | 0 refills | Status: DC

## 2024-01-01 DIAGNOSIS — L89322 Pressure ulcer of left buttock, stage 2: Secondary | ICD-10-CM | POA: Diagnosis not present

## 2024-01-01 DIAGNOSIS — D692 Other nonthrombocytopenic purpura: Secondary | ICD-10-CM

## 2024-01-01 DIAGNOSIS — M1990 Unspecified osteoarthritis, unspecified site: Secondary | ICD-10-CM

## 2024-01-01 DIAGNOSIS — K219 Gastro-esophageal reflux disease without esophagitis: Secondary | ICD-10-CM

## 2024-01-01 DIAGNOSIS — N183 Chronic kidney disease, stage 3 unspecified: Secondary | ICD-10-CM | POA: Diagnosis not present

## 2024-01-01 DIAGNOSIS — Z87891 Personal history of nicotine dependence: Secondary | ICD-10-CM

## 2024-01-01 DIAGNOSIS — M51369 Other intervertebral disc degeneration, lumbar region without mention of lumbar back pain or lower extremity pain: Secondary | ICD-10-CM

## 2024-01-01 DIAGNOSIS — M4800 Spinal stenosis, site unspecified: Secondary | ICD-10-CM

## 2024-01-01 DIAGNOSIS — E785 Hyperlipidemia, unspecified: Secondary | ICD-10-CM

## 2024-01-01 DIAGNOSIS — I129 Hypertensive chronic kidney disease with stage 1 through stage 4 chronic kidney disease, or unspecified chronic kidney disease: Secondary | ICD-10-CM | POA: Diagnosis not present

## 2024-01-01 DIAGNOSIS — M8580 Other specified disorders of bone density and structure, unspecified site: Secondary | ICD-10-CM

## 2024-01-01 DIAGNOSIS — J449 Chronic obstructive pulmonary disease, unspecified: Secondary | ICD-10-CM

## 2024-01-01 DIAGNOSIS — R21 Rash and other nonspecific skin eruption: Secondary | ICD-10-CM | POA: Diagnosis not present

## 2024-01-01 DIAGNOSIS — F419 Anxiety disorder, unspecified: Secondary | ICD-10-CM

## 2024-01-06 DIAGNOSIS — N183 Chronic kidney disease, stage 3 unspecified: Secondary | ICD-10-CM | POA: Diagnosis not present

## 2024-01-06 DIAGNOSIS — I129 Hypertensive chronic kidney disease with stage 1 through stage 4 chronic kidney disease, or unspecified chronic kidney disease: Secondary | ICD-10-CM | POA: Diagnosis not present

## 2024-01-06 DIAGNOSIS — M51369 Other intervertebral disc degeneration, lumbar region without mention of lumbar back pain or lower extremity pain: Secondary | ICD-10-CM | POA: Diagnosis not present

## 2024-01-06 DIAGNOSIS — L89322 Pressure ulcer of left buttock, stage 2: Secondary | ICD-10-CM | POA: Diagnosis not present

## 2024-01-06 DIAGNOSIS — R21 Rash and other nonspecific skin eruption: Secondary | ICD-10-CM | POA: Diagnosis not present

## 2024-01-06 DIAGNOSIS — J449 Chronic obstructive pulmonary disease, unspecified: Secondary | ICD-10-CM | POA: Diagnosis not present

## 2024-01-08 DIAGNOSIS — L89322 Pressure ulcer of left buttock, stage 2: Secondary | ICD-10-CM | POA: Diagnosis not present

## 2024-01-08 DIAGNOSIS — N183 Chronic kidney disease, stage 3 unspecified: Secondary | ICD-10-CM | POA: Diagnosis not present

## 2024-01-08 DIAGNOSIS — M8580 Other specified disorders of bone density and structure, unspecified site: Secondary | ICD-10-CM | POA: Diagnosis not present

## 2024-01-08 DIAGNOSIS — Z87891 Personal history of nicotine dependence: Secondary | ICD-10-CM | POA: Diagnosis not present

## 2024-01-08 DIAGNOSIS — D692 Other nonthrombocytopenic purpura: Secondary | ICD-10-CM | POA: Diagnosis not present

## 2024-01-08 DIAGNOSIS — R21 Rash and other nonspecific skin eruption: Secondary | ICD-10-CM | POA: Diagnosis not present

## 2024-01-08 DIAGNOSIS — M1990 Unspecified osteoarthritis, unspecified site: Secondary | ICD-10-CM | POA: Diagnosis not present

## 2024-01-08 DIAGNOSIS — F419 Anxiety disorder, unspecified: Secondary | ICD-10-CM | POA: Diagnosis not present

## 2024-01-08 DIAGNOSIS — M4800 Spinal stenosis, site unspecified: Secondary | ICD-10-CM | POA: Diagnosis not present

## 2024-01-08 DIAGNOSIS — K219 Gastro-esophageal reflux disease without esophagitis: Secondary | ICD-10-CM | POA: Diagnosis not present

## 2024-01-08 DIAGNOSIS — J449 Chronic obstructive pulmonary disease, unspecified: Secondary | ICD-10-CM | POA: Diagnosis not present

## 2024-01-08 DIAGNOSIS — E785 Hyperlipidemia, unspecified: Secondary | ICD-10-CM | POA: Diagnosis not present

## 2024-01-08 DIAGNOSIS — M51369 Other intervertebral disc degeneration, lumbar region without mention of lumbar back pain or lower extremity pain: Secondary | ICD-10-CM | POA: Diagnosis not present

## 2024-01-08 DIAGNOSIS — I129 Hypertensive chronic kidney disease with stage 1 through stage 4 chronic kidney disease, or unspecified chronic kidney disease: Secondary | ICD-10-CM | POA: Diagnosis not present

## 2024-01-18 DIAGNOSIS — I129 Hypertensive chronic kidney disease with stage 1 through stage 4 chronic kidney disease, or unspecified chronic kidney disease: Secondary | ICD-10-CM | POA: Diagnosis not present

## 2024-01-18 DIAGNOSIS — N183 Chronic kidney disease, stage 3 unspecified: Secondary | ICD-10-CM | POA: Diagnosis not present

## 2024-01-18 DIAGNOSIS — R21 Rash and other nonspecific skin eruption: Secondary | ICD-10-CM | POA: Diagnosis not present

## 2024-01-18 DIAGNOSIS — L89322 Pressure ulcer of left buttock, stage 2: Secondary | ICD-10-CM | POA: Diagnosis not present

## 2024-01-18 DIAGNOSIS — M51369 Other intervertebral disc degeneration, lumbar region without mention of lumbar back pain or lower extremity pain: Secondary | ICD-10-CM | POA: Diagnosis not present

## 2024-01-18 DIAGNOSIS — J449 Chronic obstructive pulmonary disease, unspecified: Secondary | ICD-10-CM | POA: Diagnosis not present

## 2024-02-05 DIAGNOSIS — R21 Rash and other nonspecific skin eruption: Secondary | ICD-10-CM | POA: Diagnosis not present

## 2024-02-05 DIAGNOSIS — I129 Hypertensive chronic kidney disease with stage 1 through stage 4 chronic kidney disease, or unspecified chronic kidney disease: Secondary | ICD-10-CM | POA: Diagnosis not present

## 2024-02-05 DIAGNOSIS — M51369 Other intervertebral disc degeneration, lumbar region without mention of lumbar back pain or lower extremity pain: Secondary | ICD-10-CM | POA: Diagnosis not present

## 2024-02-05 DIAGNOSIS — N183 Chronic kidney disease, stage 3 unspecified: Secondary | ICD-10-CM | POA: Diagnosis not present

## 2024-02-05 DIAGNOSIS — J449 Chronic obstructive pulmonary disease, unspecified: Secondary | ICD-10-CM | POA: Diagnosis not present

## 2024-02-05 DIAGNOSIS — L89322 Pressure ulcer of left buttock, stage 2: Secondary | ICD-10-CM | POA: Diagnosis not present

## 2024-02-16 NOTE — Patient Instructions (Signed)

## 2024-02-17 ENCOUNTER — Ambulatory Visit (INDEPENDENT_AMBULATORY_CARE_PROVIDER_SITE_OTHER): Admitting: Nurse Practitioner

## 2024-02-17 ENCOUNTER — Encounter: Payer: Self-pay | Admitting: Nurse Practitioner

## 2024-02-17 VITALS — BP 135/78 | HR 84 | Temp 98.4°F | Resp 15 | Ht 59.49 in | Wt 124.6 lb

## 2024-02-17 DIAGNOSIS — R399 Unspecified symptoms and signs involving the genitourinary system: Secondary | ICD-10-CM | POA: Diagnosis not present

## 2024-02-17 DIAGNOSIS — N3001 Acute cystitis with hematuria: Secondary | ICD-10-CM | POA: Diagnosis not present

## 2024-02-17 DIAGNOSIS — N390 Urinary tract infection, site not specified: Secondary | ICD-10-CM | POA: Insufficient documentation

## 2024-02-17 DIAGNOSIS — R8281 Pyuria: Secondary | ICD-10-CM | POA: Diagnosis not present

## 2024-02-17 LAB — URINALYSIS, ROUTINE W REFLEX MICROSCOPIC
Bilirubin, UA: NEGATIVE
Glucose, UA: NEGATIVE
Ketones, UA: NEGATIVE
Nitrite, UA: NEGATIVE
Specific Gravity, UA: 1.02 (ref 1.005–1.030)
Urobilinogen, Ur: 0.2 mg/dL (ref 0.2–1.0)
pH, UA: 6.5 (ref 5.0–7.5)

## 2024-02-17 LAB — MICROSCOPIC EXAMINATION: Bacteria, UA: NONE SEEN

## 2024-02-17 MED ORDER — CEFPODOXIME PROXETIL 100 MG PO TABS: 100.0000 mg | ORAL_TABLET | Freq: Two times a day (BID) | ORAL | 0 refills | Status: AC

## 2024-02-17 NOTE — Progress Notes (Signed)
 BP 135/78 (BP Location: Left Arm, Patient Position: Sitting, Cuff Size: Normal)   Pulse 84   Temp 98.4 F (36.9 C) (Oral)   Resp 15   Ht 4' 11.49 (1.511 m)   Wt 124 lb 9.6 oz (56.5 kg)   LMP 11/28/1971 (Approximate)   SpO2 98%   BMI 24.75 kg/m    Subjective:    Patient ID: Karen Velasquez, female    DOB: [birth year], 89 y.o.   MRN: [id number]  HPI: Karen Velasquez is a 89 y.o. female  Chief Complaint  Patient presents with   Urinary Tract Infection    About a week long. Started as constipation but drank/took prune juice and stool softeners and then had diarrhea. Now having pain after urinating. No longer having stomach pains. Burning pain after urinating, irritated. Described it as weird.     URINARY SYMPTOMS Started about one week ago. When urinates is fine, but when gets to end there is burning and discomfort. Started with her having some constipation. Has had constipation issues her whole life.  Uses two stool softeners and a little prune juice daily.   Dysuria: burning Urinary frequency: yes Urgency: no Small volume voids: yes Symptom severity: yes Urinary incontinence: no Foul odor: no Hematuria: no Abdominal pain: yes Back pain: yes Suprapubic pain/pressure: yes Flank pain: no Fever:  no Vomiting: no Status: stable Previous urinary tract infection: yes Recurrent urinary tract infection: no Sexual activity: No sexually active History of sexually transmitted disease: no Treatments attempted: increasing fluids    Relevant past medical, surgical, family and social history reviewed and updated as indicated. Interim medical history since our last visit reviewed. Allergies and medications reviewed and updated.  Review of Systems  Constitutional:  Negative for activity change, appetite change, diaphoresis, fatigue and fever.  Respiratory:  Negative for cough, chest tightness, shortness of breath and wheezing.   Cardiovascular:  Negative for chest pain, palpitations  and leg swelling.  Gastrointestinal:  Positive for constipation (baseline). Negative for abdominal distention, abdominal pain, diarrhea, nausea, rectal pain and vomiting.  Genitourinary:  Positive for dysuria and frequency. Negative for difficulty urinating, hematuria and urgency.  Neurological: Negative.   Psychiatric/Behavioral: Negative.      Per HPI unless specifically indicated above     Objective:    BP 135/78 (BP Location: Left Arm, Patient Position: Sitting, Cuff Size: Normal)   Pulse 84   Temp 98.4 F (36.9 C) (Oral)   Resp 15   Ht 4' 11.49 (1.511 m)   Wt 124 lb 9.6 oz (56.5 kg)   LMP 11/28/1971 (Approximate)   SpO2 98%   BMI 24.75 kg/m   Wt Readings from Last 3 Encounters:  02/17/24 124 lb 9.6 oz (56.5 kg)  12/22/23 129 lb (58.5 kg)  12/03/23 128 lb 9.6 oz (58.3 kg)    Physical Exam Vitals and nursing note reviewed.  Constitutional:      General: She is awake. She is not in acute distress.    Appearance: She is well-developed and well-groomed. She is not ill-appearing or toxic-appearing.  HENT:     Head: Normocephalic.     Right Ear: Hearing and external ear normal.     Left Ear: Hearing and external ear normal.  Eyes:     General: Lids are normal.        Right eye: No discharge.        Left eye: No discharge.     Conjunctiva/sclera: Conjunctivae normal.     Pupils: Pupils  are equal, round, and reactive to light.  Neck:     Thyroid : No thyromegaly.     Vascular: No carotid bruit.  Cardiovascular:     Rate and Rhythm: Normal rate and regular rhythm.     Heart sounds: Normal heart sounds. No murmur heard.    No gallop.  Pulmonary:     Effort: Pulmonary effort is normal. No accessory muscle usage or respiratory distress.     Breath sounds: Normal breath sounds.  Abdominal:     General: Bowel sounds are normal. There is no distension.     Palpations: Abdomen is soft.     Tenderness: There is no abdominal tenderness.  Musculoskeletal:     Cervical back:  Normal range of motion and neck supple.     Right lower leg: No edema.     Left lower leg: No edema.  Lymphadenopathy:     Cervical: No cervical adenopathy.  Skin:    General: Skin is warm and dry.  Neurological:     Mental Status: She is alert and oriented to person, place, and time.     Deep Tendon Reflexes: Reflexes are normal and symmetric.     Reflex Scores:      Brachioradialis reflexes are 2+ on the right side and 2+ on the left side.      Patellar reflexes are 2+ on the right side and 2+ on the left side. Psychiatric:        Attention and Perception: Attention normal.        Mood and Affect: Mood normal.        Speech: Speech normal.        Behavior: Behavior normal. Behavior is cooperative.        Thought Content: Thought content normal.     Results for orders placed or performed in visit on 02/17/24  Microscopic Examination   Collection Time: 02/17/24  8:58 AM   Urine  Result Value Ref Range   WBC, UA 6-10 (A) 0 - 5 /hpf   RBC, Urine 0-2 0 - 2 /hpf   Epithelial Cells (non renal) 0-10 0 - 10 /hpf   Bacteria, UA None seen None seen/Few  Urinalysis, Routine w reflex microscopic   Collection Time: 02/17/24  8:58 AM  Result Value Ref Range   Specific Gravity, UA 1.020 1.005 - 1.030   pH, UA 6.5 5.0 - 7.5   Color, UA Straw Yellow   Appearance Ur Hazy (A) Clear   Leukocytes,UA 1+ (A) Negative   Protein,UA Trace (A) Negative/Trace   Glucose, UA Negative Negative   Ketones, UA Negative Negative   RBC, UA Trace (A) Negative   Bilirubin, UA Negative Negative   Urobilinogen, Ur 0.2 0.2 - 1.0 mg/dL   Nitrite, UA Negative Negative   Microscopic Examination See below:       Assessment & Plan:   Problem List Items Addressed This Visit       Genitourinary   Urinary tract infection - Primary   Acute with symptoms x one week.  UA showing some positive findings -- trace BLD, Leuks, white blood cells on microscopic.  Will start Vantin  BID for 5 days and adjust as needed  based on culture findings.  Recommend increased water intake at home + start daily Metamucil for bowel regimen.      Relevant Medications   cefpodoxime  (VANTIN ) 100 MG tablet   Other Relevant Orders   Urinalysis, Routine w reflex microscopic (Completed)   Microscopic Examination (Completed)  Other Visit Diagnoses       Pyuria       Urine send for culture and will adjust as needed.   Relevant Orders   Urine Culture        Follow up plan: Return if symptoms worsen or fail to improve.

## 2024-02-17 NOTE — Assessment & Plan Note (Signed)
 Acute with symptoms x one week.  UA showing some positive findings -- trace BLD, Leuks, white blood cells on microscopic.  Will start Vantin  BID for 5 days and adjust as needed based on culture findings.  Recommend increased water intake at home + start daily Metamucil for bowel regimen.

## 2024-02-22 ENCOUNTER — Ambulatory Visit: Payer: Self-pay | Admitting: Nurse Practitioner

## 2024-02-22 LAB — URINE CULTURE

## 2024-03-24 ENCOUNTER — Ambulatory Visit: Admitting: Family Medicine

## 2024-03-28 ENCOUNTER — Ambulatory Visit: Admitting: Podiatry

## 2024-04-04 ENCOUNTER — Encounter: Payer: Self-pay | Admitting: Family Medicine

## 2024-04-04 ENCOUNTER — Ambulatory Visit (INDEPENDENT_AMBULATORY_CARE_PROVIDER_SITE_OTHER): Admitting: Family Medicine

## 2024-04-04 VITALS — BP 124/76 | HR 73 | Ht 60.0 in | Wt 126.8 lb

## 2024-04-04 DIAGNOSIS — Z23 Encounter for immunization: Secondary | ICD-10-CM

## 2024-04-04 DIAGNOSIS — F419 Anxiety disorder, unspecified: Secondary | ICD-10-CM

## 2024-04-04 MED ORDER — FENOFIBRATE MICRONIZED 130 MG PO CAPS: ORAL_CAPSULE | ORAL | 1 refills | Status: AC

## 2024-04-04 MED ORDER — FLUTICASONE PROPIONATE 50 MCG/ACT NA SUSP: 2.0000 | Freq: Two times a day (BID) | NASAL | 4 refills | Status: AC

## 2024-04-04 MED ORDER — LISINOPRIL 5 MG PO TABS: 5.0000 mg | ORAL_TABLET | Freq: Every day | ORAL | 1 refills | Status: AC

## 2024-04-04 MED ORDER — EZETIMIBE 10 MG PO TABS: 10.0000 mg | ORAL_TABLET | Freq: Every day | ORAL | 1 refills | Status: AC

## 2024-04-04 NOTE — Assessment & Plan Note (Signed)
 Will restart her remeron  and recheck how she's feeling in a week. Call with any concerns.

## 2024-04-04 NOTE — Progress Notes (Signed)
 BP 124/76   Pulse 73   Ht 5' (1.524 m)   Wt 126 lb 12.8 oz (57.5 kg)   LMP 11/28/1971 (Approximate)   SpO2 97%   BMI 24.76 kg/m    Subjective:    Patient ID: Karen Velasquez, female    DOB: August 29, 1930, 89 y.o.   MRN: [id number]  HPI: Karen Velasquez is a 89 y.o. female  Chief Complaint  Patient presents with   Anxiety   Insomnia   INSOMNIA- has not been taking her remeron  for unknown amount of time. Having trouble sleeping and would like something to help her sleep. Duration: chronic Satisfied with sleep quality: no Difficulty falling asleep: yes Difficulty staying asleep: yes Waking a few hours after sleep onset: no Early morning awakenings: no Daytime hypersomnolence: yes Wakes feeling refreshed: no Good sleep hygiene: yes Apnea: no Snoring: no Depressed/anxious mood: no Recent stress: no Restless legs/nocturnal leg cramps: no Chronic pain/arthritis: no History of sleep study: no Treatments attempted: none   Relevant past medical, surgical, family and social history reviewed and updated as indicated. Interim medical history since our last visit reviewed. Allergies and medications reviewed and updated.  Review of Systems  Constitutional: Negative.   Respiratory: Negative.    Cardiovascular: Negative.   Musculoskeletal: Negative.   Neurological: Negative.   Psychiatric/Behavioral:  Positive for sleep disturbance. Negative for agitation, behavioral problems, confusion, decreased concentration, dysphoric mood, hallucinations, self-injury and suicidal ideas. The patient is not nervous/anxious and is not hyperactive.     Per HPI unless specifically indicated above     Objective:    BP 124/76   Pulse 73   Ht 5' (1.524 m)   Wt 126 lb 12.8 oz (57.5 kg)   LMP 11/28/1971 (Approximate)   SpO2 97%   BMI 24.76 kg/m   Wt Readings from Last 3 Encounters:  04/04/24 126 lb 12.8 oz (57.5 kg)  02/17/24 124 lb 9.6 oz (56.5 kg)  12/22/23 129 lb (58.5 kg)     Physical Exam Vitals and nursing note reviewed.  Constitutional:      General: She is not in acute distress.    Appearance: Normal appearance. She is not ill-appearing, toxic-appearing or diaphoretic.  HENT:     Head: Normocephalic and atraumatic.     Right Ear: External ear normal.     Left Ear: External ear normal.     Nose: Nose normal.     Mouth/Throat:     Mouth: Mucous membranes are moist.     Pharynx: Oropharynx is clear.  Eyes:     General: No scleral icterus.       Right eye: No discharge.        Left eye: No discharge.     Extraocular Movements: Extraocular movements intact.     Conjunctiva/sclera: Conjunctivae normal.     Pupils: Pupils are equal, round, and reactive to light.  Cardiovascular:     Rate and Rhythm: Normal rate and regular rhythm.     Pulses: Normal pulses.     Heart sounds: Normal heart sounds. No murmur heard.    No friction rub. No gallop.  Pulmonary:     Effort: Pulmonary effort is normal. No respiratory distress.     Breath sounds: Normal breath sounds. No stridor. No wheezing, rhonchi or rales.  Chest:     Chest wall: No tenderness.  Musculoskeletal:        General: Normal range of motion.     Cervical back: Normal range of motion  and neck supple.  Skin:    General: Skin is warm and dry.     Capillary Refill: Capillary refill takes less than 2 seconds.     Coloration: Skin is not jaundiced or pale.     Findings: No bruising, erythema, lesion or rash.  Neurological:     General: No focal deficit present.     Mental Status: She is alert and oriented to person, place, and time. Mental status is at baseline.  Psychiatric:        Mood and Affect: Mood normal.        Behavior: Behavior normal.        Thought Content: Thought content normal.        Judgment: Judgment normal.     Results for orders placed or performed in visit on 02/17/24  Microscopic Examination   Collection Time: 02/17/24  8:58 AM   Urine  Result Value Ref Range   WBC,  UA 6-10 (A) 0 - 5 /hpf   RBC, Urine 0-2 0 - 2 /hpf   Epithelial Cells (non renal) 0-10 0 - 10 /hpf   Bacteria, UA None seen None seen/Few  Urinalysis, Routine w reflex microscopic   Collection Time: 02/17/24  8:58 AM  Result Value Ref Range   Specific Gravity, UA 1.020 1.005 - 1.030   pH, UA 6.5 5.0 - 7.5   Color, UA Straw Yellow   Appearance Ur Hazy (A) Clear   Leukocytes,UA 1+ (A) Negative   Protein,UA Trace (A) Negative/Trace   Glucose, UA Negative Negative   Ketones, UA Negative Negative   RBC, UA Trace (A) Negative   Bilirubin, UA Negative Negative   Urobilinogen, Ur 0.2 0.2 - 1.0 mg/dL   Nitrite, UA Negative Negative   Microscopic Examination See below:   Urine Culture   Collection Time: 02/17/24  9:33 AM   Specimen: Urine   UR  Result Value Ref Range   Urine Culture, Routine Final report (A)    Organism ID, Bacteria Escherichia coli (A)    Antimicrobial Susceptibility Comment       Assessment & Plan:   Problem List Items Addressed This Visit       Other   Anxiety - Primary   Will restart her remeron  and recheck how she's feeling in a week. Call with any concerns.       Other Visit Diagnoses       Need for COVID-19 vaccine       COVID shot given today.   Relevant Orders   Pfizer Comirnaty Covid -19 Vaccine 60yrs and older     Needs flu shot       Flu shot given today.   Relevant Orders   Flu vaccine HIGH DOSE PF(Fluzone Trivalent)        Follow up plan: Return in about 4 months (around 08/05/2024) for physical.

## 2024-04-11 ENCOUNTER — Telehealth: Payer: Self-pay | Admitting: Family Medicine

## 2024-04-11 NOTE — Telephone Encounter (Signed)
 Called and spoke with the patient's daughter. She states that the patient has not taken the medication as she should. Daughter states that she has tried to get her to take it as prescribed but the patient has not been taking it. I advised to have the patient take the medication consistently and let us  know how she is doing in about 2 weeks. Patient and daughter both verbalized understanding.

## 2024-04-11 NOTE — Telephone Encounter (Signed)
-----   Message from [facility] sent at 04/04/2024  4:14 PM EDT ----- Check in to see how she's feeling on the Remeron  with her sleep

## 2024-04-11 NOTE — Telephone Encounter (Signed)
 Can we please call over and see how she's doing with her sleep on the Remeron ?

## 2024-05-23 DIAGNOSIS — H2512 Age-related nuclear cataract, left eye: Secondary | ICD-10-CM | POA: Diagnosis not present

## 2024-06-30 ENCOUNTER — Ambulatory Visit: Admitting: Student

## 2024-06-30 ENCOUNTER — Encounter: Payer: Self-pay | Admitting: Student

## 2024-06-30 VITALS — BP 112/72 | HR 74 | Temp 97.9°F | Ht 60.0 in | Wt 123.0 lb

## 2024-06-30 DIAGNOSIS — R413 Other amnesia: Secondary | ICD-10-CM | POA: Diagnosis not present

## 2024-06-30 NOTE — Progress Notes (Unsigned)
 "  Established Patient Office Visit  Subjective   Patient ID: Karen Velasquez, female    DOB: [birth year]  Age: 89 y.o. MRN: [id number]  No chief complaint on file.  Discussed the use of AI scribe software for clinical note transcription with the patient, who gave verbal consent to proceed.  History of Present Illness Karen Velasquez is a 89 year old female who presents with acute confusion and disorientation.  She has had acute confusion and disorientation for 4 days, with a prominent episode last night around 11:30 PM when she could not understand the time and drank coffee thinking it was morning. Her daughter describes difficulty making decisions and frequent statements of I just can't think.  She lives alone with support from her daughter and daughter-in-law, who is a engineer, civil (consulting) and also noticed these cognitive changes and advised evaluation.  She has had memory problems for about a year with a marked decline over the past few days. She now has more trouble finding words and completing sentences, with increased forgetfulness and disorientation.  She uses a cane or walker for mobility and has had no recent falls. She does not drive.     Patient Active Problem List   Diagnosis Date Noted   Urinary tract infection 02/17/2024   Pressure injury of left buttock, stage 2 (HCC) 11/26/2023   Anxiety 06/22/2023   Senile purpura 06/22/2023   Osteopenia 08/17/2020   Degeneration of lumbar intervertebral disc 02/04/2018   CKD (chronic kidney disease) stage 3, GFR 30-59 ml/min (HCC) 09/15/2017   Cervical pain (neck) 09/15/2017   Advanced care planning/counseling discussion 02/24/2017   Bilateral leg cramps 02/24/2017   Word finding difficulty 02/25/2016   Allergic rhinitis 09/20/2015   COPD (chronic obstructive pulmonary disease) (HCC) 08/23/2015   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    ezetimibe  (ZETIA ) 10 MG tablet Take 1 tablet (10 mg total) by mouth daily.    fenofibrate  micronized (ANTARA ) 130 MG capsule TAKE 1 CAPSULE DAILY BEFORE BREAKFAST    fluticasone  (FLONASE ) 50 MCG/ACT nasal spray Place 2 sprays into both nostrils 2 (two) times daily.    fluticasone -salmeterol (ADVAIR) 250-50 MCG/ACT AEPB Inhale 1 puff into the lungs in the morning and at bedtime.    lisinopril  (ZESTRIL ) 5 MG tablet Take 1 tablet (5 mg total) by mouth daily.    Calcium Carbonate-Vitamin D  600-400 MG-UNIT per tablet Take 1 tablet by mouth daily.  (Patient not taking: Reported on 06/30/2024) 11/22/2014: Received from: [facility]   collagenase  (SANTYL ) 250 UNIT/GM ointment Apply 1 Application topically 2 (two) times daily. To wound on left buttock. Wound to left buttock measuring 3 cm by 2 cm. (Patient not taking: Reported on 06/30/2024)    diclofenac  Sodium (VOLTAREN ) 1 % GEL  (Patient not taking: Reported on 06/30/2024)    meclizine  (ANTIVERT ) 25 MG tablet Take 1 tablet (25 mg total) by mouth 3 (three) times daily as needed for dizziness. (Patient not taking: Reported on 06/30/2024)    prednisoLONE acetate (PRED FORTE) 1 % ophthalmic suspension SMARTSIG:In Eye(s) (Patient not taking: Reported  on 06/30/2024)    No facility-administered encounter medications on file as of 06/30/2024.    BP 112/72   Pulse 74   Temp 97.9 F (36.6 C) (Oral)   Ht 5' (1.524 m)   Wt 123 lb (55.8 kg)   LMP 11/28/1971   SpO2 98%   BMI 24.02 kg/m  BP Readings from Last 3 Encounters:  06/30/24 112/72  04/04/24 124/76  02/17/24 135/78    Physical Exam     08/06/2023    1:38 PM 06/22/2023    2:51 PM 06/22/2023     2:32 PM  Depression screen PHQ 2/9  Decreased Interest 2 2 3   Down, Depressed, Hopeless 0 2 3  PHQ - 2 Score 2 4 6   Altered sleeping 2 3 3   Tired, decreased energy 2 1 3   Change in appetite 0 0 0  Feeling bad or failure about yourself  0 0 0  Trouble concentrating 0 3 0  Moving slowly or fidgety/restless 0 0 0  Suicidal thoughts 0 0 0  PHQ-9 Score 6  11  12    Difficult doing work/chores  Somewhat difficult      Data saved with a previous flowsheet row definition       08/06/2023    1:39 PM 06/22/2023    2:33 PM 09/02/2022    1:43 PM 02/11/2022    8:57 AM  GAD 7 : Generalized Anxiety Score  Nervous, Anxious, on Edge 1  3  0  0   Control/stop worrying 3  3  0  3   Worry too much - different things 3  3  1  3    Trouble relaxing 1   0    Restless 0  0  0  0   Easily annoyed or irritable 3  3  1  3    Afraid - awful might happen 2  3  0  2   Total GAD 7 Score 13  2   Anxiety Difficulty  Somewhat difficult Somewhat difficult      Data saved with a previous flowsheet row definition    Results for orders placed or performed in visit on 06/30/24  Urine Culture   Specimen: Urine   Urine  Result Value Ref Range   Urine Culture, Routine Final report    Organism ID, Bacteria Comment   Urinalysis, Routine w reflex microscopic  Result Value Ref Range   Specific Gravity, UA 1.021 1.005 - 1.030   pH, UA 5.5 5.0 - 7.5   Color, UA Yellow Yellow   Appearance Ur Clear Clear   Leukocytes,UA Negative Negative   Protein,UA Negative Negative/Trace   Glucose, UA Negative Negative   Ketones, UA Negative Negative   RBC, UA Negative Negative   Bilirubin, UA Negative Negative   Urobilinogen, Ur 0.2 0.2 - 1.0 mg/dL   Nitrite, UA Negative Negative   Microscopic Examination Comment   TSH  Result Value Ref Range   TSH 0.645 0.450 - 4.500 uIU/mL  B12  Result Value Ref Range   Vitamin B-12 349 232 - 1,245 pg/mL    {Labs (Optional):23779}  The ASCVD Risk score (Arnett DK, et al., 2019)  failed to calculate for the following reasons:   The 2019 ASCVD risk score is only valid for ages 28 to 32   * - Cholesterol units were assumed    Assessment & Plan:  Memory change -     Urine Culture -     Urinalysis, Routine w reflex microscopic -  TSH -     Vitamin B12 -     MR BRAIN WO CONTRAST; Future     Return for memory change  with PCP .    Harlene Saddler, MD "

## 2024-07-02 LAB — URINALYSIS, ROUTINE W REFLEX MICROSCOPIC
Bilirubin, UA: NEGATIVE
Glucose, UA: NEGATIVE
Ketones, UA: NEGATIVE
Leukocytes,UA: NEGATIVE
Nitrite, UA: NEGATIVE
Protein,UA: NEGATIVE
RBC, UA: NEGATIVE
Specific Gravity, UA: 1.021 (ref 1.005–1.030)
Urobilinogen, Ur: 0.2 mg/dL (ref 0.2–1.0)
pH, UA: 5.5 (ref 5.0–7.5)

## 2024-07-02 LAB — URINE CULTURE

## 2024-07-02 LAB — TSH: TSH: 0.645 u[IU]/mL (ref 0.450–4.500)

## 2024-07-02 LAB — VITAMIN B12: Vitamin B-12: 349 pg/mL (ref 232–1245)

## 2024-07-05 ENCOUNTER — Ambulatory Visit: Payer: Self-pay | Admitting: Student

## 2024-07-06 DIAGNOSIS — R413 Other amnesia: Secondary | ICD-10-CM | POA: Insufficient documentation

## 2024-07-06 NOTE — Assessment & Plan Note (Addendum)
 6 cit 9 today, appears she has had slow declined in memory and functional status for the past few years, with acute worsening last night. Suspect delirium perhaps due to unregulated sleep wake cycles (naps frequently in day, irregular bed time), waking at an unusual time, and cognitive. Seems to be back to baseline this morning with reorientation with her daughter.  Will check UA as she reports perhaps having mild dysuria.  Labs ordered for reversible causes or memory change and MRI.  Does seem to have some anxiety as well she will follow up with PCP regarding this. Discussed sleep hygiene, awake during the day and sleeping at night, and reorientation and redirection by family members  Stroke precautions reviewed

## 2024-07-27 ENCOUNTER — Ambulatory Visit

## 2024-07-28 ENCOUNTER — Ambulatory Visit

## 2024-08-08 ENCOUNTER — Encounter: Admitting: Family Medicine
